# Patient Record
Sex: Female | Born: 1990 | Race: Asian | Hispanic: No | Smoking: Former smoker
Health system: Southern US, Community
[De-identification: ages and names within clinical notes are randomized; demographics above are authoritative.]

## PROBLEM LIST (undated history)

## (undated) ENCOUNTER — Inpatient Hospital Stay (HOSPITAL_COMMUNITY): Payer: Self-pay

## (undated) DIAGNOSIS — L309 Dermatitis, unspecified: Secondary | ICD-10-CM

## (undated) DIAGNOSIS — M549 Dorsalgia, unspecified: Secondary | ICD-10-CM

## (undated) DIAGNOSIS — N809 Endometriosis, unspecified: Secondary | ICD-10-CM

## (undated) DIAGNOSIS — F329 Major depressive disorder, single episode, unspecified: Secondary | ICD-10-CM

## (undated) DIAGNOSIS — D649 Anemia, unspecified: Secondary | ICD-10-CM

## (undated) DIAGNOSIS — X838XXA Intentional self-harm by other specified means, initial encounter: Secondary | ICD-10-CM

## (undated) DIAGNOSIS — L0291 Cutaneous abscess, unspecified: Secondary | ICD-10-CM

## (undated) DIAGNOSIS — F419 Anxiety disorder, unspecified: Secondary | ICD-10-CM

## (undated) DIAGNOSIS — F32A Depression, unspecified: Secondary | ICD-10-CM

## (undated) DIAGNOSIS — N83209 Unspecified ovarian cyst, unspecified side: Secondary | ICD-10-CM

## (undated) DIAGNOSIS — T7840XA Allergy, unspecified, initial encounter: Secondary | ICD-10-CM

## (undated) DIAGNOSIS — F4310 Post-traumatic stress disorder, unspecified: Secondary | ICD-10-CM

## (undated) DIAGNOSIS — Z8719 Personal history of other diseases of the digestive system: Secondary | ICD-10-CM

## (undated) HISTORY — DX: Unspecified ovarian cyst, unspecified side: N83.209

## (undated) HISTORY — DX: Endometriosis, unspecified: N80.9

## (undated) HISTORY — DX: Dermatitis, unspecified: L30.9

## (undated) HISTORY — DX: Major depressive disorder, single episode, unspecified: F32.9

## (undated) HISTORY — DX: Cutaneous abscess, unspecified: L02.91

## (undated) HISTORY — DX: Anemia, unspecified: D64.9

## (undated) HISTORY — PX: NO PAST SURGERIES: SHX2092

## (undated) HISTORY — DX: Anxiety disorder, unspecified: F41.9

## (undated) HISTORY — DX: Intentional self-harm by other specified means, initial encounter: X83.8XXA

## (undated) HISTORY — DX: Post-traumatic stress disorder, unspecified: F43.10

## (undated) HISTORY — DX: Depression, unspecified: F32.A

## (undated) HISTORY — DX: Dorsalgia, unspecified: M54.9

## (undated) HISTORY — DX: Allergy, unspecified, initial encounter: T78.40XA

---
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
## 2011-07-29 LAB — URINALYSIS, ROUTINE W REFLEX MICROSCOPIC
Bilirubin Urine: NEGATIVE
Hgb urine dipstick: NEGATIVE
Nitrite: NEGATIVE
Specific Gravity, Urine: 1.014
pH: 6.5

## 2011-07-29 LAB — URINE MICROSCOPIC-ADD ON

## 2013-11-12 ENCOUNTER — Ambulatory Visit (INDEPENDENT_AMBULATORY_CARE_PROVIDER_SITE_OTHER): Payer: BC Managed Care – PPO | Admitting: Emergency Medicine

## 2013-11-12 VITALS — BP 104/76 | HR 86 | Temp 99.4°F | Resp 16 | Ht 61.0 in | Wt 129.8 lb

## 2013-11-12 DIAGNOSIS — F411 Generalized anxiety disorder: Secondary | ICD-10-CM

## 2013-11-12 MED ORDER — CLONAZEPAM 0.5 MG PO TABS: 0.5000 mg | ORAL_TABLET | Freq: Every day | ORAL | Status: DC

## 2013-11-12 NOTE — Patient Instructions (Signed)

## 2013-11-12 NOTE — Progress Notes (Signed)
[facility] [address], [place] [address] [phone]  Date:  11/12/2013   Name:  Kristie ComberHae Y Thornton   DOB:  Mar 28, 1991   MRN:  [id number]  PCP:  No primary provider on file.    Chief Complaint: Medication Refill   History of Present Illness:  Kristie Thornton is a 23 y.o. very pleasant female patient who presents with the following:  Long term history of anxiety.  Multiple changes in her life, employment, loss of relationship, and moved back in with family.  Out of clonipin for past month. Some mild depression, eating and sleeping well.  No suicidal ideation or harm to others.  No improvement with over the counter medications or other home remedies. Denies other complaint or health concern today.   There are no active problems to display for this patient.   Past Medical History  Diagnosis Date  . Anxiety     History reviewed. No pertinent past surgical history.  History  Substance Use Topics  . Smoking status: Never Smoker   . Smokeless tobacco: Not on file  . Alcohol Use: No    No family history on file.  No Known Allergies  Medication list has been reviewed and updated.  No current outpatient prescriptions on file prior to visit.   No current facility-administered medications on file prior to visit.    Review of Systems:  As per HPI, otherwise negative.    Physical Examination: Filed Vitals:   11/12/13 1206  BP: 104/76  Pulse: 86  Temp: 99.4 F (37.4 C)  Resp: 16   Filed Vitals:   11/12/13 1206  Height: 5\' 1"  (1.549 m)  Weight: 129 lb 12.8 oz (58.877 kg)   Body mass index is 24.54 kg/(m^2). Ideal Body Weight: Weight in (lb) to have BMI = 25: 132  GEN: WDWN, NAD, Non-toxic, A & O x 3 HEENT: Atraumatic, Normocephalic. Neck supple. No masses, No LAD. Ears and Nose: No external deformity. CV: RRR, No M/G/R. No JVD. No thrill. No extra heart sounds. PULM: CTA B, no wheezes, crackles, rhonchi. No retractions. No resp. distress. No  accessory muscle use. ABD: S, NT, ND, +BS. No rebound. No HSM. EXTR: No c/c/e NEURO Normal gait.  PSYCH: Normally interactive. Conversant. Not depressed or anxious appearing.  Calm demeanor.    Assessment and Plan: Anxiety Refill meds  Signed,  Phillips OdorJeffery Kanyah Matsushima, MD

## 2014-01-14 ENCOUNTER — Ambulatory Visit (HOSPITAL_COMMUNITY)
Admission: RE | Admit: 2014-01-14 | Discharge: 2014-01-14 | Disposition: A | Discharge status: Home / Self Care | Payer: BC Managed Care – PPO | Source: Ambulatory Visit | Attending: Emergency Medicine | Admitting: Emergency Medicine

## 2014-01-14 ENCOUNTER — Ambulatory Visit (INDEPENDENT_AMBULATORY_CARE_PROVIDER_SITE_OTHER): Payer: BC Managed Care – PPO | Admitting: Emergency Medicine

## 2014-01-14 ENCOUNTER — Encounter (HOSPITAL_COMMUNITY): Payer: Self-pay

## 2014-01-14 VITALS — BP 108/62 | HR 100 | Temp 98.3°F | Resp 16 | Ht 61.0 in | Wt 132.0 lb

## 2014-01-14 DIAGNOSIS — D72829 Elevated white blood cell count, unspecified: Secondary | ICD-10-CM

## 2014-01-14 DIAGNOSIS — R1032 Left lower quadrant pain: Secondary | ICD-10-CM

## 2014-01-14 DIAGNOSIS — K589 Irritable bowel syndrome without diarrhea: Secondary | ICD-10-CM

## 2014-01-14 DIAGNOSIS — Z733 Stress, not elsewhere classified: Secondary | ICD-10-CM

## 2014-01-14 DIAGNOSIS — F439 Reaction to severe stress, unspecified: Secondary | ICD-10-CM

## 2014-01-14 LAB — POCT URINALYSIS DIPSTICK
BILIRUBIN UA: NEGATIVE
Glucose, UA: NEGATIVE
KETONES UA: NEGATIVE
Leukocytes, UA: NEGATIVE
Nitrite, UA: NEGATIVE
PH UA: 7
PROTEIN UA: NEGATIVE
RBC UA: NEGATIVE
Spec Grav, UA: 1.02
Urobilinogen, UA: 0.2

## 2014-01-14 LAB — COMPREHENSIVE METABOLIC PANEL
ALK PHOS: 51 U/L (ref 39–117)
ALT: 8 U/L (ref 0–35)
AST: 12 U/L (ref 0–37)
Albumin: 4.2 g/dL (ref 3.5–5.2)
BILIRUBIN TOTAL: 0.2 mg/dL (ref 0.2–1.2)
BUN: 10 mg/dL (ref 6–23)
CO2: 26 mEq/L (ref 19–32)
Calcium: 8.9 mg/dL (ref 8.4–10.5)
Chloride: 102 mEq/L (ref 96–112)
Creat: 0.47 mg/dL — ABNORMAL LOW (ref 0.50–1.10)
Glucose, Bld: 116 mg/dL — ABNORMAL HIGH (ref 70–99)
Potassium: 4.3 mEq/L (ref 3.5–5.3)
SODIUM: 137 meq/L (ref 135–145)
TOTAL PROTEIN: 6.4 g/dL (ref 6.0–8.3)

## 2014-01-14 LAB — POCT CBC
GRANULOCYTE PERCENT: 73.7 % (ref 37–80)
HCT, POC: 43 % (ref 37.7–47.9)
Hemoglobin: 13.8 g/dL (ref 12.2–16.2)
Lymph, poc: 2.6 (ref 0.6–3.4)
MCH, POC: 33 pg — AB (ref 27–31.2)
MCHC: 32.1 g/dL (ref 31.8–35.4)
MCV: 102.9 fL — AB (ref 80–97)
MID (CBC): 0.9 (ref 0–0.9)
MPV: 7.9 fL (ref 0–99.8)
PLATELET COUNT, POC: 340 10*3/uL (ref 142–424)
POC GRANULOCYTE: 9.7 — AB (ref 2–6.9)
POC LYMPH %: 19.7 % (ref 10–50)
POC MID %: 6.6 %M (ref 0–12)
RBC: 4.18 M/uL (ref 4.04–5.48)
RDW, POC: 13.2 %
WBC: 13.2 10*3/uL — AB (ref 4.6–10.2)

## 2014-01-14 LAB — POCT UA - MICROSCOPIC ONLY
Bacteria, U Microscopic: NEGATIVE
CRYSTALS, UR, HPF, POC: NEGATIVE
Casts, Ur, LPF, POC: NEGATIVE
MUCUS UA: NEGATIVE
RBC, urine, microscopic: NEGATIVE
WBC, UR, HPF, POC: NEGATIVE

## 2014-01-14 LAB — POCT URINE PREGNANCY: PREG TEST UR: NEGATIVE

## 2014-01-14 LAB — IFOBT (OCCULT BLOOD): IFOBT: NEGATIVE

## 2014-01-14 MED ORDER — IOHEXOL 300 MG/ML  SOLN
80.0000 mL | Freq: Once | INTRAMUSCULAR | Status: AC | PRN
  Administered 2014-01-14: 80 mL via INTRAVENOUS

## 2014-01-14 MED ORDER — CLONAZEPAM 1 MG PO TABS: ORAL_TABLET | ORAL | Status: DC

## 2014-01-14 MED ORDER — HYOSCYAMINE SULFATE ER 0.375 MG PO TB12: 0.3750 mg | ORAL_TABLET | Freq: Two times a day (BID) | ORAL | Status: DC

## 2014-01-14 NOTE — Progress Notes (Signed)
Subjective:    Patient ID: Kristie Thornton, female    DOB: 02-09-1991, 23 y.o.   MRN: [id number]  HPI  23 year old female presents with:  Lower left abdominal pain x 10 days, radiating to lower right side  Firm to touch, cramping, lays in fetal position for pain to subside.  Patient has a history of IBS, but episodes do not last this long [facility]  BM's irregular, sometimes spends hours in the bathroom, other times no BM's.  Diarrhea, soft, hard stools No urinary symptoms No fever, no vomiting LMP: yesterday only, one day period  Off BCP x 1 year; uses condoms Stressed, [place] moved to [place], leaving family. Moving for boyfriend. Living with his parents.  Requests refill of Klonopin   Review of Systems     Objective:   Physical Exam neck is supple chest is clear heart regular rate no murmur the abdomen is normal with normal bowel sounds liver and spleen are not enlarged there are no areas of tenderness no masses are felt Results for orders placed in visit on 01/14/14  POCT UA - MICROSCOPIC ONLY      Result Value Ref Range   WBC, Ur, HPF, POC neg     RBC, urine, microscopic neg     Bacteria, U Microscopic neg     Mucus, UA neg     Epithelial cells, urine per micros 4-6     Crystals, Ur, HPF, POC neg     Casts, Ur, LPF, POC neg     Yeast, UA      POCT URINALYSIS DIPSTICK      Result Value Ref Range   Color, UA yellow     Clarity, UA clear     Glucose, UA neg     Bilirubin, UA neg     Ketones, UA neg     Spec Grav, UA 1.020     Blood, UA neg     pH, UA 7.0     Protein, UA neg     Urobilinogen, UA 0.2     Nitrite, UA neg     Leukocytes, UA Negative    POCT CBC      Result Value Ref Range   WBC 13.2 (*) 4.6 - 10.2 K/uL   Lymph, poc 2.6  0.6 - 3.4   POC LYMPH PERCENT 19.7  10 - 50 %L   MID (cbc) 0.9  0 - 0.9   POC MID % 6.6  0 - 12 %M   POC Granulocyte 9.7 (*) 2 - 6.9   Granulocyte percent 73.7  37 - 80 %G   RBC 4.18   4.04 - 5.48 M/uL   Hemoglobin 13.8  12.2 - 16.2 g/dL   HCT, POC 78.243.0  95.637.7 - 47.9 %   MCV 102.9 (*) 80 - 97 fL   MCH, POC 33.0 (*) 27 - 31.2 pg   MCHC 32.1  31.8 - 35.4 g/dL   RDW, POC 21.313.2     Platelet Count, POC 340  142 - 424 K/uL   MPV 7.9  0 - 99.8 fL  POCT URINE PREGNANCY      Result Value Ref Range   Preg Test, Ur Negative     Hemosure neg      Assessment & Plan:  Case discussed with Dr. Elnoria HowardHung. We'll proceed with CT abdomen due to elevated white count and left lower quadrant abdominal pain rule out diverticulitis rule out  colitis.CT recent port showed bilateral punctate abnormal areas in both ovaries as well as a trace amount of fluid in the endometrial canal. There was no evidence of diverticulitis or colitis. There is a moderate stool burden. Patient advised to followup with GYN physician in [place]. She will go ahead and take the Levbid and treat her constipation. Patient is currently on her menstrual period which would explain the fluid in the endometrial canal.

## 2014-01-14 NOTE — Patient Instructions (Signed)
[facility] with Contrast. Go to radiology department to register. Drink first bottle of contrast at 10 and start the second bottle at 1045.

## 2014-02-10 ENCOUNTER — Other Ambulatory Visit: Payer: Self-pay | Admitting: Emergency Medicine

## 2014-02-11 ENCOUNTER — Telehealth: Payer: Self-pay | Admitting: Radiology

## 2014-02-11 NOTE — Telephone Encounter (Signed)
Called in.

## 2014-02-11 NOTE — Addendum Note (Signed)
Addended by: Sheppard PlumberBRIGGS, Kino Dunsworth A on: 02/11/2014 10:11 AM   Modules accepted: Orders, Medications

## 2014-02-11 NOTE — Telephone Encounter (Signed)
Pharm called and reported they just filled the Rx written 01/16/14 by Dr Cleta Albertsaub for the 1 mg tabs, 1/2 - 1 tab yesterday for pt. They did not fill the one called in today for the 0.5 mg since just filled the other. I advised we were just responding to their req for RF and she can delete that Rx called in today. Agreed.

## 2014-02-11 NOTE — Telephone Encounter (Signed)
Spoke to patient concerning medication refill. Dr Cleta Albertsaub has had me remind her not to get pregnant while taking this medication, she is aware, and states she will continue her BCP/ Amy  See previous note from [place] Rx not sent today, patient does have remaining refills on the Klonopin 1mg  tablet

## 2014-05-02 ENCOUNTER — Ambulatory Visit (INDEPENDENT_AMBULATORY_CARE_PROVIDER_SITE_OTHER): Payer: BC Managed Care – PPO | Admitting: Emergency Medicine

## 2014-05-02 VITALS — BP 112/74 | HR 81 | Temp 99.0°F | Resp 16 | Ht 61.5 in | Wt 121.3 lb

## 2014-05-02 DIAGNOSIS — Z733 Stress, not elsewhere classified: Secondary | ICD-10-CM

## 2014-05-02 DIAGNOSIS — Z3009 Encounter for other general counseling and advice on contraception: Secondary | ICD-10-CM

## 2014-05-02 DIAGNOSIS — J018 Other acute sinusitis: Secondary | ICD-10-CM

## 2014-05-02 DIAGNOSIS — Z202 Contact with and (suspected) exposure to infections with a predominantly sexual mode of transmission: Secondary | ICD-10-CM

## 2014-05-02 DIAGNOSIS — F411 Generalized anxiety disorder: Secondary | ICD-10-CM

## 2014-05-02 DIAGNOSIS — F439 Reaction to severe stress, unspecified: Secondary | ICD-10-CM

## 2014-05-02 LAB — POCT URINALYSIS DIPSTICK
Bilirubin, UA: NEGATIVE
Glucose, UA: NEGATIVE
Ketones, UA: NEGATIVE
Leukocytes, UA: NEGATIVE
Nitrite, UA: NEGATIVE
PROTEIN UA: NEGATIVE
SPEC GRAV UA: 1.015
UROBILINOGEN UA: 0.2
pH, UA: 6

## 2014-05-02 LAB — POCT UA - MICROSCOPIC ONLY
CASTS, UR, LPF, POC: NEGATIVE
CRYSTALS, UR, HPF, POC: NEGATIVE
Mucus, UA: NEGATIVE
WBC, Ur, HPF, POC: NEGATIVE
Yeast, UA: NEGATIVE

## 2014-05-02 LAB — POCT WET PREP WITH KOH
KOH Prep POC: NEGATIVE
RBC WET PREP PER HPF POC: NEGATIVE
Trichomonas, UA: NEGATIVE
WBC WET PREP PER HPF POC: NEGATIVE
YEAST WET PREP PER HPF POC: NEGATIVE

## 2014-05-02 LAB — POCT URINE PREGNANCY: PREG TEST UR: NEGATIVE

## 2014-05-02 MED ORDER — AMOXICILLIN-POT CLAVULANATE 875-125 MG PO TABS: 1.0000 | ORAL_TABLET | Freq: Two times a day (BID) | ORAL | Status: DC

## 2014-05-02 MED ORDER — PSEUDOEPHEDRINE-GUAIFENESIN ER 60-600 MG PO TB12: 1.0000 | ORAL_TABLET | Freq: Two times a day (BID) | ORAL | Status: DC

## 2014-05-02 MED ORDER — CLONAZEPAM 1 MG PO TABS: 1.0000 mg | ORAL_TABLET | Freq: Three times a day (TID) | ORAL | Status: DC | PRN

## 2014-05-02 MED ORDER — NORGESTREL-ETHINYL ESTRADIOL 0.3-30 MG-MCG PO TABS: 1.0000 | ORAL_TABLET | Freq: Every day | ORAL | Status: DC

## 2014-05-02 NOTE — Patient Instructions (Signed)
Sinusitis Sinusitis is redness, soreness, and swelling (inflammation) of the paranasal sinuses. Paranasal sinuses are air pockets within the bones of your face (beneath the eyes, the middle of the forehead, or above the eyes). In healthy paranasal sinuses, mucus is able to drain out, and air is able to circulate through them by way of your nose. However, when your paranasal sinuses are inflamed, mucus and air can become trapped. This can allow bacteria and other germs to grow and cause infection. Sinusitis can develop quickly and last only a short time (acute) or continue over a long period (chronic). Sinusitis that lasts for more than 12 weeks is considered chronic.  CAUSES  Causes of sinusitis include:  Allergies.  Structural abnormalities, such as displacement of the cartilage that separates your nostrils (deviated septum), which can decrease the air flow through your nose and sinuses and affect sinus drainage.  Functional abnormalities, such as when the small hairs (cilia) that line your sinuses and help remove mucus do not work properly or are not present. SYMPTOMS  Symptoms of acute and chronic sinusitis are the same. The primary symptoms are pain and pressure around the affected sinuses. Other symptoms include:  Upper toothache.  Earache.  Headache.  Bad breath.  Decreased sense of smell and taste.  A cough, which worsens when you are lying flat.  Fatigue.  Fever.  Thick drainage from your nose, which often is green and may contain pus (purulent).  Swelling and warmth over the affected sinuses. DIAGNOSIS  Your caregiver will perform a physical exam. During the exam, your caregiver may:  Look in your nose for signs of abnormal growths in your nostrils (nasal polyps).  Tap over the affected sinus to check for signs of infection.  View the inside of your sinuses (endoscopy) with a special imaging device with a light attached (endoscope), which is inserted into your  sinuses. If your caregiver suspects that you have chronic sinusitis, one or more of the following tests may be recommended:  Allergy tests.  Nasal culture--A sample of mucus is taken from your nose and sent to a lab and screened for bacteria.  Nasal cytology--A sample of mucus is taken from your nose and examined by your caregiver to determine if your sinusitis is related to an allergy. TREATMENT  Most cases of acute sinusitis are related to a viral infection and will resolve on their own within 10 days. Sometimes medicines are prescribed to help relieve symptoms (pain medicine, decongestants, nasal steroid sprays, or saline sprays).  However, for sinusitis related to a bacterial infection, your caregiver will prescribe antibiotic medicines. These are medicines that will help kill the bacteria causing the infection.  Rarely, sinusitis is caused by a fungal infection. In theses cases, your caregiver will prescribe antifungal medicine. For some cases of chronic sinusitis, surgery is needed. Generally, these are cases in which sinusitis recurs more than 3 times per year, despite other treatments. HOME CARE INSTRUCTIONS   Drink plenty of water. Water helps thin the mucus so your sinuses can drain more easily.  Use a humidifier.  Inhale steam 3 to 4 times a day (for example, sit in the bathroom with the shower running).  Apply a warm, moist washcloth to your face 3 to 4 times a day, or as directed by your caregiver.  Use saline nasal sprays to help moisten and clean your sinuses.  Take over-the-counter or prescription medicines for pain, discomfort, or fever only as directed by your caregiver. SEEK IMMEDIATE MEDICAL CARE IF:    You have increasing pain or severe headaches.  You have nausea, vomiting, or drowsiness.  You have swelling around your face.  You have vision problems.  You have a stiff neck.  You have difficulty breathing. MAKE SURE YOU:   Understand these  instructions.  Will watch your condition.  Will get help right away if you are not doing well or get worse. Document Released: 10/14/2005 Document Revised: 01/06/2012 Document Reviewed: 10/29/2011 ExitCare Patient Information 2015 [employer]. This information is not intended to replace advice given to you by your health care provider. Make sure you discuss any questions you have with your health care provider.  

## 2014-05-02 NOTE — Progress Notes (Signed)
[facility] [address], [place] [address] [phone]  Date:  05/02/2014   Name:  Kristie Thornton   DOB:  1991-03-24   MRN:  [id number]  PCP:  No PCP Per Patient    Chief Complaint: Medication Dose Change, Exposure to STD and Contraception   History of Present Illness:  Kristie Thornton is a 23 y.o. very pleasant female patient who presents with the following:  Requests OCP.  Had an unplanned sexual encounter over the weekend and requests an STD screening.  Is still having anxiety issues and is requesting an increased dose since moving home with parents.  One month duration nasal congestion, purulent drainage, cough and hoarseness.  No improvement with over the counter medications or other home remedies. Denies other complaint or health concern today.   There are no active problems to display for this patient.   Past Medical History  Diagnosis Date  . Anxiety     History reviewed. No pertinent past surgical history.  History  Substance Use Topics  . Smoking status: Never Smoker   . Smokeless tobacco: Not on file  . Alcohol Use: No    History reviewed. No pertinent family history.  No Known Allergies  Medication list has been reviewed and updated.  Current Outpatient Prescriptions on File Prior to Visit  Medication Sig Dispense Refill  . clonazePAM (KLONOPIN) 1 MG tablet Take one half to one tablet daily.  30 tablet  2   No current facility-administered medications on file prior to visit.    Review of Systems:  As per HPI, otherwise negative.    Physical Examination: Filed Vitals:   05/02/14 1135  BP: 112/74  Pulse: 81  Temp: 99 F (37.2 C)  Resp: 16   Filed Vitals:   05/02/14 1135  Height: 5' 1.5" (1.562 m)  Weight: 121 lb 4.8 oz (55.021 kg)   Body mass index is 22.55 kg/(m^2). Ideal Body Weight: Weight in (lb) to have BMI = 25: 134.2   GEN: WDWN, NAD, Non-toxic, Alert & Oriented x 3 HEENT: Atraumatic, Normocephalic.  Ears and  Nose: No external deformity. EXTR: No clubbing/cyanosis/edema NEURO: Normal gait.  PSYCH: Normally interactive. Conversant. Not depressed or anxious appearing.  Calm demeanor.    Assessment and Plan: STD exposure Anxiety OCP Lo ovral Labs Sinusitis augmentin mucinex d   Signed,  Phillips OdorJeffery Kaylean Tupou, MD   Results for orders placed in visit on 05/02/14  POCT URINE PREGNANCY      Result Value Ref Range   Preg Test, Ur Negative    POCT WET PREP WITH KOH      Result Value Ref Range   Trichomonas, UA Negative     Clue Cells Wet Prep HPF POC 0-1     Epithelial Wet Prep HPF POC 0-6     Yeast Wet Prep HPF POC neg     Bacteria Wet Prep HPF POC 1+     RBC Wet Prep HPF POC neg     WBC Wet Prep HPF POC neg     KOH Prep POC Negative    POCT UA - MICROSCOPIC ONLY      Result Value Ref Range   WBC, Ur, HPF, POC neg     RBC, urine, microscopic 0-4     Bacteria, U Microscopic 1+     Mucus, UA neg     Epithelial cells, urine per micros TNTC     Crystals, Ur, HPF, POC neg     Casts,  Ur, LPF, POC neg     Yeast, UA neg    POCT URINALYSIS DIPSTICK      Result Value Ref Range   Color, UA yellow     Clarity, UA clear     Glucose, UA neg     Bilirubin, UA neg     Ketones, UA neg     Spec Grav, UA 1.015     Blood, UA trace-intact     pH, UA 6.0     Protein, UA neg     Urobilinogen, UA 0.2     Nitrite, UA neg     Leukocytes, UA Negative

## 2014-05-03 LAB — GC/CHLAMYDIA PROBE AMP
CT PROBE, AMP APTIMA: NEGATIVE
GC Probe RNA: NEGATIVE

## 2014-05-03 LAB — HIV ANTIBODY (ROUTINE TESTING W REFLEX): HIV 1&2 Ab, 4th Generation: NONREACTIVE

## 2014-07-01 ENCOUNTER — Ambulatory Visit (INDEPENDENT_AMBULATORY_CARE_PROVIDER_SITE_OTHER): Payer: BC Managed Care – PPO | Admitting: Family Medicine

## 2014-07-01 VITALS — BP 110/62 | HR 95 | Temp 98.1°F | Resp 18 | Ht 62.0 in | Wt 119.0 lb

## 2014-07-01 DIAGNOSIS — F411 Generalized anxiety disorder: Secondary | ICD-10-CM

## 2014-07-01 DIAGNOSIS — N83201 Unspecified ovarian cyst, right side: Secondary | ICD-10-CM

## 2014-07-01 DIAGNOSIS — N83202 Unspecified ovarian cyst, left side: Principal | ICD-10-CM

## 2014-07-01 DIAGNOSIS — N83209 Unspecified ovarian cyst, unspecified side: Secondary | ICD-10-CM

## 2014-07-01 MED ORDER — NORGESTREL-ETHINYL ESTRADIOL 0.3-30 MG-MCG PO TABS: 1.0000 | ORAL_TABLET | Freq: Every day | ORAL | Status: DC

## 2014-07-01 NOTE — Patient Instructions (Signed)
Try to minimize the use of the clonazepam. You should still have some refills on that.  Continue taking your contraceptive pills for suppression of the ovaries. If the bleeding persists we will probably need a repeat gynecologic evaluation.  Recommend at some point you do see a counselor and/or psychiatrist for counseling and possible adjustment of medications  Get regular exercise. That is one of the best things to help unwind nerves.

## 2014-07-01 NOTE — Progress Notes (Signed)
Subjective: 23 year old [place] lady who has been on medications for her anxiety. She was last prescribed clonazepam 1 mg 3 times daily with 3 refills, which to my calculation should carry her until November. She also is on contraceptives for suppression of ovarian cyst. She says that she is gay and that they're not for birth control. She stopped taking them about a week ago and has persistent bleeding. She has no other major medical complaints. She tells about a couple of her friends who've died of drugs. She has stopped smoking since last time. She says she's going to move to [place] next week and find a Veterinary surgeon and stop taking anxiety medications. Has been on Zoloft before and did poorly on it  Objective: Physical exam not done. She talks very fast, almost pressured speech. Is fully oriented.  Assessment: Anxiety disorder, consider possibility of bipolar Birth-control pills for history of ovarian cysts  Plan:  Did not give her any more of the clonazepam since she should still have some. Recommend she see a psychiatrist or counselor. Continue taking her birth control pills.

## 2014-08-08 ENCOUNTER — Ambulatory Visit (INDEPENDENT_AMBULATORY_CARE_PROVIDER_SITE_OTHER): Payer: BC Managed Care – PPO | Admitting: Physician Assistant

## 2014-08-08 ENCOUNTER — Telehealth: Payer: Self-pay | Admitting: Physician Assistant

## 2014-08-08 VITALS — BP 114/68 | HR 94 | Temp 99.2°F | Resp 17 | Ht 62.0 in | Wt 119.0 lb

## 2014-08-08 DIAGNOSIS — Z0000 Encounter for general adult medical examination without abnormal findings: Secondary | ICD-10-CM

## 2014-08-08 DIAGNOSIS — N809 Endometriosis, unspecified: Secondary | ICD-10-CM | POA: Insufficient documentation

## 2014-08-08 DIAGNOSIS — F411 Generalized anxiety disorder: Secondary | ICD-10-CM

## 2014-08-08 DIAGNOSIS — Z3041 Encounter for surveillance of contraceptive pills: Secondary | ICD-10-CM

## 2014-08-08 DIAGNOSIS — N760 Acute vaginitis: Secondary | ICD-10-CM

## 2014-08-08 DIAGNOSIS — N83202 Unspecified ovarian cyst, left side: Secondary | ICD-10-CM

## 2014-08-08 DIAGNOSIS — A499 Bacterial infection, unspecified: Secondary | ICD-10-CM

## 2014-08-08 DIAGNOSIS — N83201 Unspecified ovarian cyst, right side: Secondary | ICD-10-CM | POA: Insufficient documentation

## 2014-08-08 DIAGNOSIS — N898 Other specified noninflammatory disorders of vagina: Secondary | ICD-10-CM

## 2014-08-08 DIAGNOSIS — B9689 Other specified bacterial agents as the cause of diseases classified elsewhere: Secondary | ICD-10-CM

## 2014-08-08 DIAGNOSIS — Z124 Encounter for screening for malignant neoplasm of cervix: Secondary | ICD-10-CM

## 2014-08-08 LAB — POCT URINALYSIS DIPSTICK
BILIRUBIN UA: NEGATIVE
Glucose, UA: NEGATIVE
KETONES UA: NEGATIVE
Leukocytes, UA: NEGATIVE
Nitrite, UA: NEGATIVE
PH UA: 6
Protein, UA: NEGATIVE
RBC UA: NEGATIVE
SPEC GRAV UA: 1.025
Urobilinogen, UA: 0.2

## 2014-08-08 LAB — POCT URINE PREGNANCY: Preg Test, Ur: NEGATIVE

## 2014-08-08 LAB — POCT WET PREP WITH KOH
KOH Prep POC: NEGATIVE
RBC Wet Prep HPF POC: NEGATIVE
Trichomonas, UA: NEGATIVE
YEAST WET PREP PER HPF POC: NEGATIVE

## 2014-08-08 LAB — POCT UA - MICROSCOPIC ONLY
Bacteria, U Microscopic: NEGATIVE
Casts, Ur, LPF, POC: NEGATIVE
Crystals, Ur, HPF, POC: NEGATIVE
EPITHELIAL CELLS, URINE PER MICROSCOPY: NEGATIVE
MUCUS UA: NEGATIVE
RBC, URINE, MICROSCOPIC: NEGATIVE
WBC, UR, HPF, POC: NEGATIVE
YEAST UA: NEGATIVE

## 2014-08-08 MED ORDER — NORGESTREL-ETHINYL ESTRADIOL 0.3-30 MG-MCG PO TABS: 1.0000 | ORAL_TABLET | Freq: Every day | ORAL | Status: DC

## 2014-08-08 MED ORDER — CLONAZEPAM 1 MG PO TABS: 1.0000 mg | ORAL_TABLET | Freq: Three times a day (TID) | ORAL | Status: DC | PRN

## 2014-08-08 MED ORDER — VENLAFAXINE HCL ER 37.5 MG PO CP24: ORAL_CAPSULE | ORAL | Status: DC

## 2014-08-08 MED ORDER — METRONIDAZOLE 500 MG PO TABS: 500.0000 mg | ORAL_TABLET | Freq: Two times a day (BID) | ORAL | Status: AC

## 2014-08-08 NOTE — Telephone Encounter (Signed)
Pt advised of results. 

## 2014-08-08 NOTE — Progress Notes (Signed)
Subjective:    Patient ID: Kristie Thornton, female    DOB: 1991/06/19, 23 y.o.   MRN: [id number]   PCP: No PCP Per Patient  Chief Complaint  Patient presents with  . Annual Exam    with PAP  . Sore Throat  . Vaginal Discharge    x1 week   . Medication Refill    Klonopin      Active Ambulatory Problems    Diagnosis Date Noted  . Generalized anxiety disorder 08/08/2014  . Endometriosis 08/08/2014  . Bilateral ovarian cysts 08/08/2014   Resolved Ambulatory Problems    Diagnosis Date Noted  . No Resolved Ambulatory Problems   Past Medical History  Diagnosis Date  . Anxiety     History reviewed. No pertinent past surgical history.  No Known Allergies  Prior to Admission medications   Medication Sig Start Date End Date Taking? Authorizing Provider  clonazePAM (KLONOPIN) 1 MG tablet Take 1 tablet (1 mg total) by mouth 3 (three) times daily as needed for anxiety. 05/02/14  Yes Carmelina DaneJeffery S Anderson, MD  norgestrel-ethinyl estradiol (LO/OVRAL) 0.3-30 MG-MCG tablet Take 1 tablet by mouth daily. 07/01/14  Yes Peyton Najjaravid H Hopper, MD    History   Social History  . Marital Status: Single    Spouse Name: n/a    Number of Children: 0  . Years of Education: 12th +   Occupational History  . sales     [employer]   Social History Main Topics  . Smoking status: Former Smoker    Quit date: 07/05/2014  . Smokeless tobacco: Former NeurosurgeonUser     Comment: previously vaped  . Alcohol Use: No  . Drug Use: No  . Sexual Activity: Yes    Partners: Male    Birth Control/ Protection: Pill   Other Topics Concern  . None   Social History Narrative   Parents are from [place]. Parents and her siblings were born in the [place]    family history includes Cancer in her mother. indicated that her mother is alive. She indicated that her father is alive. She indicated that both of her sisters are alive. She indicated that her brother is alive. She indicated that her maternal grandmother is deceased. She  indicated that her maternal grandfather is deceased. She indicated that her paternal grandmother is deceased. She indicated that her paternal grandfather is deceased.   HPI  Presents for annual physical exam, with several other issues to address.  1. She needs a refill of her contraceptive.  She takes it continuously, skipping the non-active pills, to treat underlying endometriosis and ovarian cysts.   2. 1 week of vaginal discharge.  No itching. Sexually active with 1 female partner. Had full STI screening about 2 months ago, including HSV IgG and reports all were negative. No dyspareunia.   3. Sore throat.  She has lots of drainage in the throat, and a little cough.  Subjective mild fever. No ear pain, headache, myalgias.   4. Requests a new prescription of the clonazepam to fill next month.  She's working on reducing use to 1/2 tablet twice daily after a friend told her clonazepam can be habit-forming. She repeatedly states that she doesn't like to take medication, and in her teens, used marijuana to self-treat her symptoms. Didn't tolerate medications previously tried due to adverse effects: sertraline (suicidality), and paroxetine (itch).  Also previously treated for ADD with Vyvanse, but after graduated from HS, didn't need it any more.  She repeatedly tells  me that she needs to leave to go to work, and as such declines flu and Tdap vaccines and blood work.  While she's somewhat concerned that she may be pregnant, she doesn't want to wait for the result of the test and states, "You can just call me."  Review of Systems As above, otherwise negative.    Objective:   Physical Exam  Vitals reviewed. Constitutional: She is oriented to person, place, and time. Vital signs are normal. She appears well-developed and well-nourished. She is active and cooperative. No distress.  HENT:  Head: Normocephalic and atraumatic.  Right Ear: Hearing, tympanic membrane, external ear and ear canal normal. No  foreign bodies.  Left Ear: Hearing, tympanic membrane, external ear and ear canal normal. No foreign bodies.  Nose: Nose normal.  Mouth/Throat: Uvula is midline, oropharynx is clear and moist and mucous membranes are normal. No oral lesions. Normal dentition. No dental abscesses or uvula swelling. No oropharyngeal exudate.  Eyes: Conjunctivae, EOM and lids are normal. Pupils are equal, round, and reactive to light. Right eye exhibits no discharge. Left eye exhibits no discharge. No scleral icterus.  Fundoscopic exam:      The right eye shows no arteriolar narrowing, no AV nicking, no exudate, no hemorrhage and no papilledema.       The left eye shows no arteriolar narrowing, no AV nicking, no exudate, no hemorrhage and no papilledema.  Neck: Trachea normal, normal range of motion and full passive range of motion without pain. Neck supple. No spinous process tenderness and no muscular tenderness present. No mass and no thyromegaly present.  Cardiovascular: Normal rate, regular rhythm, normal heart sounds, intact distal pulses and normal pulses.   Pulmonary/Chest: Effort normal and breath sounds normal. She exhibits no tenderness and no retraction. Right breast exhibits no inverted nipple, no mass, no nipple discharge, no skin change and no tenderness. Left breast exhibits no inverted nipple, no mass, no nipple discharge, no skin change and no tenderness. Breasts are symmetrical.  Abdominal: Soft. Normal appearance and bowel sounds are normal. She exhibits no distension and no mass. There is no hepatosplenomegaly. There is no tenderness. There is no rigidity, no rebound, no guarding, no CVA tenderness, no tenderness at McBurney's point and negative Murphy's sign. No hernia. Hernia confirmed negative in the right inguinal area and confirmed negative in the left inguinal area.  Genitourinary: Rectum normal, vagina normal and uterus normal. Rectal exam shows no external hemorrhoid and no fissure. No breast  swelling, tenderness, discharge or bleeding. Pelvic exam was performed with patient supine. No labial fusion. There is no rash, tenderness, lesion or injury on the right labia. There is no rash, tenderness, lesion or injury on the left labia. Cervix exhibits no motion tenderness, no discharge and no friability. Right adnexum displays no mass, no tenderness and no fullness. Left adnexum displays no mass, no tenderness and no fullness. No erythema, tenderness or bleeding around the vagina. No foreign body around the vagina. No signs of injury around the vagina. No vaginal discharge found.  Cervix appears multiparous, but she reports no history of pregnancy or treatment of the cervix.  Musculoskeletal: She exhibits no edema and no tenderness.       Cervical back: Normal.       Thoracic back: Normal.       Lumbar back: Normal.  Lymphadenopathy:       Head (right side): No tonsillar, no preauricular, no posterior auricular and no occipital adenopathy present.  Head (left side): No tonsillar, no preauricular, no posterior auricular and no occipital adenopathy present.    She has no cervical adenopathy.    She has no axillary adenopathy.       Right: No inguinal and no supraclavicular adenopathy present.       Left: No inguinal and no supraclavicular adenopathy present.  Neurological: She is alert and oriented to person, place, and time. She has normal strength and normal reflexes. No cranial nerve deficit. She exhibits normal muscle tone. Coordination and gait normal.  Skin: Skin is warm, dry and intact. No rash noted. She is not diaphoretic. No cyanosis or erythema. Nails show no clubbing.  Psychiatric: She has a normal mood and affect. Her speech is normal and behavior is normal. Judgment and thought content normal.   Results for orders placed in visit on 08/08/14  POCT URINE PREGNANCY      Result Value Ref Range   Preg Test, Ur Negative    POCT UA - MICROSCOPIC ONLY      Result Value Ref  Range   WBC, Ur, HPF, POC neg     RBC, urine, microscopic neg     Bacteria, U Microscopic neg     Mucus, UA neg     Epithelial cells, urine per micros neg     Crystals, Ur, HPF, POC neg     Casts, Ur, LPF, POC neg     Yeast, UA neg    POCT URINALYSIS DIPSTICK      Result Value Ref Range   Color, UA yellow     Clarity, UA clear     Glucose, UA neg     Bilirubin, UA neg     Ketones, UA neg     Spec Grav, UA 1.025     Blood, UA neg     pH, UA 6.0     Protein, UA neg     Urobilinogen, UA 0.2     Nitrite, UA neg     Leukocytes, UA Negative    POCT WET PREP WITH KOH      Result Value Ref Range   Trichomonas, UA Negative     Clue Cells Wet Prep HPF POC 3-10     Epithelial Wet Prep HPF POC 6-20     Yeast Wet Prep HPF POC neg     Bacteria Wet Prep HPF POC 2+     RBC Wet Prep HPF POC neg     WBC Wet Prep HPF POC 10-20     KOH Prep POC Negative            Assessment & Plan:  1. Annual physical exam Age appropriate anticipatory guidance provided.  2. Generalized anxiety disorder Planned TSH, but she left without lab draw. Counseled at length regarding chronic use of benzodiazepines. Reviewed mechanism of action of various families of drugs used to treat anxiety and depression. - POCT UA - Microscopic Only - POCT urinalysis dipstick - venlafaxine XR (EFFEXOR-XR) 37.5 MG 24 hr capsule; Take 1 tablet daily for 1 week, then increase to 2 tablets once daily  Dispense: 180 capsule; Refill: 3 - clonazePAM (KLONOPIN) 1 MG tablet; Take 1 tablet (1 mg total) by mouth 3 (three) times daily as needed for anxiety.  Dispense: 90 tablet; Refill: 0  3. Vaginal discharge, BV Wet Prep, not available at the time she left, reveals BV. If symptoms persist, will plan CT/GC testing again. - POCT Wet Prep with KOH -metronidazole 500 mg 1 PO BID  x 7 days, #14 NO RF  4. Encounter for surveillance of contraceptive pills Tolerating current contraception well. - POCT urine pregnancy -  norgestrel-ethinyl estradiol (LO/OVRAL) 0.3-30 MG-MCG tablet; Take 1 tablet by mouth daily.  Dispense: 3 Package; Refill: 3  5. Screening for cervical cancer Await pap results. - Pap IG w/ reflex to HPV when ASC-U   Fernande Brashelle S. Floreine Kingdon, PA-C Physician Assistant-Certified [facility]

## 2014-08-08 NOTE — Patient Instructions (Signed)
Get plenty of rest and drink at least 64 ounces of water daily.  I will contact you with your lab results as soon as they are available.   If you have not heard from me in 2 weeks, please contact me.  The fastest way to get your results is to register for My Chart (see the instructions on the last page of this printout).  Use OTC Mucinex DM to help thin the mucous draining in your throat and to reduce the cough.  Keeping You Healthy  Get These Tests 1. Blood Pressure- Have your blood pressure checked once a year by your health care provider.  Normal blood pressure is 120/80. 2. Weight- Have your body mass index (BMI) calculated to screen for obesity.  BMI is measure of body fat based on height and weight.  You can also calculate your own BMI at https://www.west-esparza.com/www.nhlbisupport.com/bmi/. 3. Cholesterol- Have your cholesterol checked every 5 years starting at age 23 then yearly starting at age 23. 4. Chlamydia, HIV, and other sexually transmitted diseases- Get screened every year until age 23, then within three months of each new sexual provider. 5. Pap Smear- Every 1-3 years; discuss with your health care provider. 6. Mammogram- Every year starting at age 23  Take these medicines  Calcium with Vitamin D-Your body needs 1200 mg of Calcium each day and [phone] IU of Vitamin D daily.  Your body can only absorb 500 mg of Calcium at a time so Calcium must be taken in 2 or 3 divided doses throughout the day.  Multivitamin with folic acid- Once daily if it is possible for you to become pregnant.  Get these Immunizations  Gardasil-Series of three doses; prevents HPV related illness such as genital warts and cervical cancer.  Menactra-Single dose; prevents meningitis.  Tetanus shot- Every 10 years.  Flu shot-Every year.  Take these steps 1. Do not smoke-Your healthcare provider can help you quit.  For tips on how to quit go to www.smokefree.gov or call 1-800 QUITNOW. 2. Be physically active- Exercise 5  days a week for at least 30 minutes.  If you are not already physically active, start slow and gradually work up to 30 minutes of moderate physical activity.  Examples of moderate activity include walking briskly, dancing, swimming, bicycling, etc. 3. Breast Cancer- A self breast exam every month is important for early detection of breast cancer.  For more information and instruction on self breast exams, ask your healthcare provider or [place].eswww.womenshealth.gov/faq/breast-self-exam.cfm. 4. Eat a healthy diet- Eat a variety of healthy foods such as fruits, vegetables, whole grains, low fat milk, low fat cheeses, yogurt, lean meats, poultry and fish, beans, nuts, tofu, etc.  For more information go to www. Thenutritionsource.org 5. Drink alcohol in moderation- Limit alcohol intake to one drink or less per day. Never drink and drive. 6. Depression- Your emotional health is as important as your physical health.  If you're feeling down or losing interest in things you normally enjoy please talk to your healthcare provider about being screened for depression. 7. Dental visit- Brush and floss your teeth twice daily; visit your dentist twice a year. 8. Eye doctor- Get an eye exam at least every 2 years. 9. Helmet use- Always wear a helmet when riding a bicycle, motorcycle, rollerblading or skateboarding. 10. Safe sex- If you may be exposed to sexually transmitted infections, use a condom. 11. Seat belts- Seat belts can save your live; always wear one. 12. Smoke/Carbon Monoxide detectors- These detectors need to be  installed on the appropriate level of your home. Replace batteries at least once a year. 13. Skin cancer- When out in the sun please cover up and use sunscreen 15 SPF or higher. 14. Violence- If anyone is threatening or hurting you, please tell your healthcare provider.

## 2014-08-08 NOTE — Telephone Encounter (Signed)
Please call this patient. Her labs reveal: 1. She is NOT pregnant.  2. She has a mild bacterial infection in the vagina, Bacterial Vaginosis (BV). I've sent a prescription for metronidazole to the pharmacy for her. If the vaginal discharge persists, she needs re-evaluation.

## 2014-08-08 NOTE — Addendum Note (Signed)
Addended by: Fernande BrasJEFFERY, Tameshia Bonneville S on: 08/08/2014 03:51 PM   Modules accepted: Level of Service

## 2014-08-10 LAB — PAP IG W/ RFLX HPV ASCU

## 2014-08-30 ENCOUNTER — Other Ambulatory Visit: Payer: Self-pay | Admitting: Emergency Medicine

## 2014-08-31 NOTE — Telephone Encounter (Signed)
She's a week early for this. Is she taking the Effexor? If she's still needing to take the clonazepam 3 times each day, we need to make an adjustment.

## 2014-09-02 NOTE — Telephone Encounter (Signed)
I had called pt on 08/31/14 and she had to get off the phone bc her boss was calling her. She told me she would call back, but I hadn't heard from her. I was able to reach pt again who reported that she had travelled to [place] and all of her medications, even BC was confiscated at the airport. She was difficult to understand on the phone - not a good connection but she was complaining about back pain and about her endometriosis. She said that was not happy with her BCPs anyway and she and her boyfriend had decided to use condoms. I advised her to come back into office be evaluated for back pain/endometriosis, discuss [facility] and get RFs on her medication. Pt stated that she is working 12 hr days but she will try to get here first thing in the morning to see Dr Clelia CroftShaw or Dareen PianoAnderson bc they are familiar w/her family.

## 2014-09-16 ENCOUNTER — Other Ambulatory Visit: Payer: Self-pay | Admitting: Internal Medicine

## 2014-09-16 NOTE — Telephone Encounter (Signed)
Forward to Rohm and HaasChelle Jeffrey--it's her patient

## 2014-09-17 ENCOUNTER — Other Ambulatory Visit: Payer: Self-pay | Admitting: Internal Medicine

## 2014-09-19 NOTE — Telephone Encounter (Signed)
Not my patient

## 2014-09-21 ENCOUNTER — Ambulatory Visit (INDEPENDENT_AMBULATORY_CARE_PROVIDER_SITE_OTHER): Payer: BC Managed Care – PPO | Admitting: Family Medicine

## 2014-09-21 ENCOUNTER — Ambulatory Visit (INDEPENDENT_AMBULATORY_CARE_PROVIDER_SITE_OTHER): Payer: BC Managed Care – PPO

## 2014-09-21 VITALS — BP 92/56 | HR 91 | Temp 98.4°F | Resp 16 | Ht 61.0 in | Wt 115.4 lb

## 2014-09-21 DIAGNOSIS — Z13220 Encounter for screening for lipoid disorders: Secondary | ICD-10-CM

## 2014-09-21 DIAGNOSIS — N912 Amenorrhea, unspecified: Secondary | ICD-10-CM

## 2014-09-21 DIAGNOSIS — F411 Generalized anxiety disorder: Secondary | ICD-10-CM

## 2014-09-21 DIAGNOSIS — R05 Cough: Secondary | ICD-10-CM

## 2014-09-21 DIAGNOSIS — R059 Cough, unspecified: Secondary | ICD-10-CM

## 2014-09-21 DIAGNOSIS — Z130 Encounter for screening for diseases of the blood and blood-forming organs and certain disorders involving the immune mechanism: Secondary | ICD-10-CM

## 2014-09-21 DIAGNOSIS — M545 Low back pain, unspecified: Secondary | ICD-10-CM

## 2014-09-21 DIAGNOSIS — Z1329 Encounter for screening for other suspected endocrine disorder: Secondary | ICD-10-CM

## 2014-09-21 DIAGNOSIS — J209 Acute bronchitis, unspecified: Secondary | ICD-10-CM

## 2014-09-21 DIAGNOSIS — Z3202 Encounter for pregnancy test, result negative: Secondary | ICD-10-CM

## 2014-09-21 LAB — POCT URINALYSIS DIPSTICK
Bilirubin, UA: NEGATIVE
Blood, UA: NEGATIVE
Glucose, UA: NEGATIVE
Ketones, UA: 15
Leukocytes, UA: NEGATIVE
Nitrite, UA: NEGATIVE
Protein, UA: NEGATIVE
Spec Grav, UA: 1.02
Urobilinogen, UA: 0.2
pH, UA: 7

## 2014-09-21 LAB — POCT UA - MICROSCOPIC ONLY
Bacteria, U Microscopic: NEGATIVE
Casts, Ur, LPF, POC: NEGATIVE
Crystals, Ur, HPF, POC: NEGATIVE
Mucus, UA: NEGATIVE
RBC, urine, microscopic: NEGATIVE
WBC, Ur, HPF, POC: NEGATIVE
Yeast, UA: NEGATIVE

## 2014-09-21 LAB — POCT URINE PREGNANCY: Preg Test, Ur: NEGATIVE

## 2014-09-21 LAB — POCT CBC
Granulocyte percent: 57.5 % (ref 37–80)
HCT, POC: 41.9 % (ref 37.7–47.9)
Hemoglobin: 13.6 g/dL (ref 12.2–16.2)
Lymph, poc: 2.9 (ref 0.6–3.4)
MCH, POC: 32.8 pg — AB (ref 27–31.2)
MCHC: 32.6 g/dL (ref 31.8–35.4)
MCV: 100.6 fL — AB (ref 80–97)
MID (cbc): 0.3 (ref 0–0.9)
MPV: 6.2 fL (ref 0–99.8)
POC Granulocyte: 4.4 (ref 2–6.9)
POC LYMPH PERCENT: 38.1 % (ref 10–50)
POC MID %: 4.4 % (ref 0–12)
Platelet Count, POC: 315 K/uL (ref 142–424)
RBC: 4.16 M/uL (ref 4.04–5.48)
RDW, POC: 13.2 %
WBC: 7.7 K/uL (ref 4.6–10.2)

## 2014-09-21 MED ORDER — BENZONATATE 100 MG PO CAPS: 200.0000 mg | ORAL_CAPSULE | Freq: Two times a day (BID) | ORAL | Status: DC | PRN

## 2014-09-21 MED ORDER — VENLAFAXINE HCL ER 37.5 MG PO CP24: ORAL_CAPSULE | ORAL | Status: DC

## 2014-09-21 MED ORDER — AZITHROMYCIN 250 MG PO TABS: ORAL_TABLET | ORAL | Status: DC

## 2014-09-21 MED ORDER — CLONAZEPAM 1 MG PO TABS: 1.0000 mg | ORAL_TABLET | Freq: Three times a day (TID) | ORAL | Status: DC | PRN

## 2014-09-21 MED ORDER — HYDROCODONE-HOMATROPINE 5-1.5 MG/5ML PO SYRP: 5.0000 mL | ORAL_SOLUTION | Freq: Every evening | ORAL | Status: DC | PRN

## 2014-09-21 NOTE — Progress Notes (Signed)
Chief Complaint:  Chief Complaint  Patient presents with  . Cough    x 2 months  . Medication Refill    lexapro; klonopin   . Back Pain    HPI: Kristie Thornton is a 23 y.o. female who is here for mutliple reasons  1. She would like her effexor and also klonopin refilled since  Left them in [place], Was in [place] taking care of mom who had colon surgery for colon cancer , left meds in [place] , was in [place] for 1 week  2. She has had cough since end of September, can;t seelp at night, not SOB, wheezing; she does smoke but occasionally.  She has not had fevers or chills, she denies and body aches or msk aches or rash or diarrhea /nausea/vomitingSxs all started prior to going to [place].  3. She also has had back pain, Has endometriosis and ovarian cyst need records from ob/gyn , last CT abd and pelvis below, she also has  scoliosis. As well.  Feel similar sxs  to cyst but  then also something extra. Has tried otc meds without relief.   IMPRESSION: 1. Suspected bilateral punctate adnexal cysts, right greater than left, with trace amount of presumably physiologic fluid within the endometrial canal and pelvic cul-de-sac. Otherwise, no explanation for patient's left lower quadrant abdominal pain. Further evaluation could be performed with pelvic ultrasound as clinically indicated. 2. Moderate colonic stool burden without evidence of obstruction. No definite evidence of diverticulitis or appendicitis.   Electronically Signed  By: Simonne Come M.D.  On: 01/14/2014 11:39    Past Medical History  Diagnosis Date  . Anxiety    No past surgical history on file. History   Social History  . Marital Status: Single    Spouse Name: n/a    Number of Children: 0  . Years of Education: 12th +   Occupational History  . sales     [employer]   Social History Main Topics  . Smoking status: Former Smoker    Quit date: 07/05/2014  . Smokeless tobacco: Former Neurosurgeon     Comment: previously  vaped  . Alcohol Use: No  . Drug Use: No  . Sexual Activity:    Partners: Male    Birth Control/ Protection: Pill   Other Topics Concern  . None   Social History Narrative   Parents are from [place]. Parents and her siblings were born in the [place].   Family History  Problem Relation Age of Onset  . Cancer Mother     colon cancer   Allergies  Allergen Reactions  . Sertraline Hcl Other (See Comments)    Suicidality   Prior to Admission medications   Medication Sig Start Date End Date Taking? Authorizing Provider  clonazePAM (KLONOPIN) 1 MG tablet Take 1 tablet (1 mg total) by mouth 3 (three) times daily as needed for anxiety. Patient not taking: Reported on 09/21/2014 08/08/14   Fernande Bras, PA-C  norgestrel-ethinyl estradiol (LO/OVRAL) 0.3-30 MG-MCG tablet Take 1 tablet by mouth daily. Patient not taking: Reported on 09/21/2014 08/08/14   Fernande Bras, PA-C  venlafaxine XR (EFFEXOR-XR) 37.5 MG 24 hr capsule Take 1 tablet daily for 1 week, then increase to 2 tablets once daily Patient not taking: Reported on 09/21/2014 08/08/14   Chelle S Jeffery, PA-C     ROS: The patient denies fevers, chills, night sweats, unintentional weight loss, chest pain, palpitations, wheezing, dyspnea on exertion, nausea, vomiting, abdominal pain,  dysuria, hematuria, melena, numbness, weakness, or tingling.   All other systems have been reviewed and were otherwise negative with the exception of those mentioned in the HPI and as above.    PHYSICAL EXAM: Filed Vitals:   09/21/14 1024  BP: 92/56  Pulse: 91  Temp: 98.4 F (36.9 C)  Resp: 16   Filed Vitals:   09/21/14 1024  Height: 5\' 1"  (1.549 m)  Weight: 115 lb 6.4 oz (52.345 kg)   Body mass index is 21.82 kg/(m^2).  General: Alert, no acute distress HEENT:  Normocephalic, atraumatic, oropharynx patent. EOMI, PERRLA TM nl, erythematous throat, No exudates. + boggy nares, + sinus tenderness. Cardiovascular:  Regular rate and rhythm,  no rubs murmurs or gallops.  No Carotid bruits, radial pulse intact. No pedal edema.  Respiratory: Clear to auscultation bilaterally.  No wheezes, rales, or rhonchi.  No cyanosis, no use of accessory musculature GI: No organomegaly, abdomen is soft and non-tender, positive bowel sounds.  No masses. Skin: No rashes. Neurologic: Facial musculature symmetric. Psychiatric: Patient is appropriate throughout our interaction. Lymphatic: No cervical lymphadenopathy Musculoskeletal: Gait intact. + paramsk tenderness bilateral back  Full ROM 5/5 strength, 2/2 DTRs No saddle anesthesia Straight leg negative Hip and knee exam--normal    LABS: Results for orders placed or performed in visit on 09/21/14  POCT CBC  Result Value Ref Range   WBC 7.7 4.6 - 10.2 K/uL   Lymph, poc 2.9 0.6 - 3.4   POC LYMPH PERCENT 38.1 10 - 50 %L   MID (cbc) 0.3 0 - 0.9   POC MID % 4.4 0 - 12 %M   POC Granulocyte 4.4 2 - 6.9   Granulocyte percent 57.5 37 - 80 %G   RBC 4.16 4.04 - 5.48 M/uL   Hemoglobin 13.6 12.2 - 16.2 g/dL   HCT, POC 16.141.9 09.637.7 - 47.9 %   MCV 100.6 (A) 80 - 97 fL   MCH, POC 32.8 (A) 27 - 31.2 pg   MCHC 32.6 31.8 - 35.4 g/dL   RDW, POC 04.513.2 %   Platelet Count, POC 315 142 - 424 K/uL   MPV 6.2 0 - 99.8 fL  POCT UA - Microscopic Only  Result Value Ref Range   WBC, Ur, HPF, POC Neg    RBC, urine, microscopic Neg    Bacteria, U Microscopic Neg    Mucus, UA Neg    Epithelial cells, urine per micros 0-1    Crystals, Ur, HPF, POC Neg    Casts, Ur, LPF, POC Neg    Yeast, UA Neg   POCT urinalysis dipstick  Result Value Ref Range   Color, UA Yellow    Clarity, UA Clear    Glucose, UA Neg    Bilirubin, UA Neg    Ketones, UA 15    Spec Grav, UA 1.020    Blood, UA Neg    pH, UA 7.0    Protein, UA Neg    Urobilinogen, UA 0.2    Nitrite, UA Neg    Leukocytes, UA Negative   POCT urine pregnancy  Result Value Ref Range   Preg Test, Ur Negative      EKG/XRAY:   Primary read [facility]  [facility]. Bronchitic changes vs less likely infiltrate Lumbar spine neg for fracture or dislocation   ASSESSMENT/PLAN: Encounter Diagnoses  Name Primary?  . Cough Yes  . Amenorrhea   . Screening for deficiency anemia   . Screening for hyperlipidemia   . Screening  for thyroid disorder   . Midline low back pain without sciatica   . Acute bronchitis, unspecified organism   . Generalized anxiety disorder    Rx z pack, tessalon perles, hycodan, albuterol prn Rx Konopin and also effexor She will establish care with a different PCP due to changes in insurance, will get records from ob.gyn Narcotic Prescription Profile pulled and there is no illegal activites Labs pending F/u in 6 months prn   Gross sideeffects, risk and benefits, and alternatives of medications d/w patient. Patient is aware that all medications have potential sideeffects and we are unable to predict every sideeffect or drug-drug interaction that may occur.  Jadore Mcguffin PHUONG, DO 09/21/2014 1:01 PM

## 2014-09-21 NOTE — Patient Instructions (Signed)
Acute Bronchitis °Bronchitis is inflammation of the airways that extend from the windpipe into the lungs (bronchi). The inflammation often causes mucus to develop. This leads to a cough, which is the most common symptom of bronchitis.  °In acute bronchitis, the condition usually develops suddenly and goes away over time, usually in a couple weeks. Smoking, allergies, and asthma can make bronchitis worse. Repeated episodes of bronchitis may cause further lung problems.  °CAUSES °Acute bronchitis is most often caused by the same virus that causes a cold. The virus can spread from person to person (contagious) through coughing, sneezing, and touching contaminated objects. °SIGNS AND SYMPTOMS  °· Cough.   °· Fever.   °· Coughing up mucus.   °· Body aches.   °· Chest congestion.   °· Chills.   °· Shortness of breath.   °· Sore throat.   °DIAGNOSIS  °Acute bronchitis is usually diagnosed through a physical exam. Your health care provider will also ask you questions about your medical history. Tests, such as chest X-rays, are sometimes done to rule out other conditions.  °TREATMENT  °Acute bronchitis usually goes away in a couple weeks. Oftentimes, no medical treatment is necessary. Medicines are sometimes given for relief of fever or cough. Antibiotic medicines are usually not needed but may be prescribed in certain situations. In some cases, an inhaler may be recommended to help reduce shortness of breath and control the cough. A cool mist vaporizer may also be used to help thin bronchial secretions and make it easier to clear the chest.  °HOME CARE INSTRUCTIONS °· Get plenty of rest.   °· Drink enough fluids to keep your urine clear or pale yellow (unless you have a medical condition that requires fluid restriction). Increasing fluids may help thin your respiratory secretions (sputum) and reduce chest congestion, and it will prevent dehydration.   °· Take medicines only as directed by your health care provider. °· If  you were prescribed an antibiotic medicine, finish it all even if you start to feel better. °· Avoid smoking and secondhand smoke. Exposure to cigarette smoke or irritating chemicals will make bronchitis worse. If you are a smoker, consider using nicotine gum or skin patches to help control withdrawal symptoms. Quitting smoking will help your lungs heal faster.   °· Reduce the chances of another bout of acute bronchitis by washing your hands frequently, avoiding people with cold symptoms, and trying not to touch your hands to your mouth, nose, or eyes.   °· Keep all follow-up visits as directed by your health care provider.   °SEEK MEDICAL CARE IF: °Your symptoms do not improve after 1 week of treatment.  °SEEK IMMEDIATE MEDICAL CARE IF: °· You develop an increased fever or chills.   °· You have chest pain.   °· You have severe shortness of breath. °· You have bloody sputum.   °· You develop dehydration. °· You faint or repeatedly feel like you are going to pass out. °· You develop repeated vomiting. °· You develop a severe headache. °MAKE SURE YOU:  °· Understand these instructions. °· Will watch your condition. °· Will get help right away if you are not doing well or get worse. °Document Released: 11/21/2004 Document Revised: 02/28/2014 Document Reviewed: 04/06/2013 °ExitCare® Patient Information ©2015 [employer]. This information is not intended to replace advice given to you by your health care provider. Make sure you discuss any questions you have with your health care provider. ° °

## 2014-09-22 LAB — LIPID PANEL
Cholesterol: 282 mg/dL — ABNORMAL HIGH (ref 0–200)
HDL: 65 mg/dL (ref >39)
LDL Cholesterol: 203 mg/dL — ABNORMAL HIGH (ref 0–99)
Total CHOL/HDL Ratio: 4.3 Ratio
Triglycerides: 68 mg/dL (ref <150)
VLDL: 14 mg/dL (ref 0–40)

## 2014-09-22 LAB — COMPLETE METABOLIC PANEL WITH GFR
ALT: 14 U/L (ref 0–35)
AST: 15 U/L (ref 0–37)
Alkaline Phosphatase: 40 U/L (ref 39–117)
CO2: 27 mEq/L (ref 19–32)
Creat: 0.58 mg/dL (ref 0.50–1.10)
Sodium: 139 mEq/L (ref 135–145)
Total Bilirubin: 0.7 mg/dL (ref 0.2–1.2)
Total Protein: 7.4 g/dL (ref 6.0–8.3)

## 2014-09-22 LAB — COMPLETE METABOLIC PANEL WITHOUT GFR
Albumin: 4.5 g/dL (ref 3.5–5.2)
BUN: 11 mg/dL (ref 6–23)
Calcium: 9.8 mg/dL (ref 8.4–10.5)
Chloride: 103 meq/L (ref 96–112)
GFR, Est African American: >89 mL/min
GFR, Est Non African American: >89 mL/min
Glucose, Bld: 83 mg/dL (ref 70–99)
Potassium: 4.3 meq/L (ref 3.5–5.3)

## 2014-09-22 LAB — TSH: TSH: 1.809 u[IU]/mL (ref 0.350–4.500)

## 2014-10-01 ENCOUNTER — Ambulatory Visit (INDEPENDENT_AMBULATORY_CARE_PROVIDER_SITE_OTHER): Payer: BC Managed Care – PPO | Admitting: Family Medicine

## 2014-10-01 VITALS — BP 100/60 | HR 112 | Temp 99.5°F | Resp 16 | Ht 61.5 in | Wt 113.0 lb

## 2014-10-01 DIAGNOSIS — N926 Irregular menstruation, unspecified: Secondary | ICD-10-CM

## 2014-10-01 DIAGNOSIS — Z3202 Encounter for pregnancy test, result negative: Secondary | ICD-10-CM

## 2014-10-01 DIAGNOSIS — J0100 Acute maxillary sinusitis, unspecified: Secondary | ICD-10-CM

## 2014-10-01 DIAGNOSIS — F411 Generalized anxiety disorder: Secondary | ICD-10-CM

## 2014-10-01 LAB — POCT URINE PREGNANCY: PREG TEST UR: NEGATIVE

## 2014-10-01 MED ORDER — IPRATROPIUM BROMIDE 0.03 % NA SOLN: 2.0000 | Freq: Two times a day (BID) | NASAL | Status: DC

## 2014-10-01 MED ORDER — HYDROCOD POLST-CHLORPHEN POLST 10-8 MG/5ML PO LQCR: 5.0000 mL | Freq: Two times a day (BID) | ORAL | Status: DC | PRN

## 2014-10-01 MED ORDER — CEFDINIR 300 MG PO CAPS: 600.0000 mg | ORAL_CAPSULE | Freq: Every day | ORAL | Status: DC

## 2014-10-01 NOTE — Progress Notes (Signed)
Subjective:    Patient ID: Kristie Thornton, female    DOB: 15-Apr-1991, 23 y.o.   MRN: [id number]  HPI Chief Complaint  Patient presents with  . Cough    pt states she was here recently and her sickness has not improved  . Insomnia   This chart was scribed for Nilda SimmerKristi Alix Lahmann, MD by Andrew Auaven Small, ED Scribe. This patient was seen in room 3 and the patient's care was started at 11:05 AM.  HPI Comments: Kristie Thornton is a 23 y.o. female who presents to the [facility] complaining of a cough. Pt was seen 1 week ago by Dr. Conley RollsLe for a cough. She states she has taken the Z pak and tessalon prescribed during last visit. Pt states she has not been sleeping well. She reports unchanged symptoms including congestion, rhinorrhea consisting of yellow green mucus, SOB, and hoarseness  She denies fever, chills, sweats, ear pain, and emesis.  Pt states she needs medical clearance to be on the [employer] show.   Pt reports mother is in her 3860's with hx of colon cancer. Pt reports her father is in 8460's and currently has testicular cancer. She reports sister fibromyalgia and a brother with ADHD.   Pt is currently single. She is not dating. She lives by herself. Pt states she lives by herself. Pt smokes marijuana occasionally, drinks alcohol 1-2 times a week. Pt denies hx past hospitalization and surgeries.    Pt reports emotionally she is doing okay. She reports she has been talking to a pastor for counseling. She denies self harm but has had thoughts in the past.  Has recently undergone a break up with a boyfriend.  Good social support.    Pt states her last menstrual cycle which started last week was heavier than normal with a black color. She reports the month before she missed her cycle. Pt is concerned she may have had a miscarriage.   Past Medical History  Diagnosis Date  . Anxiety    Allergies  Allergen Reactions  . Sertraline Hcl Other (See Comments)    Suicidality   Prior to  Admission medications   Medication Sig Start Date End Date Taking? Authorizing Provider  benzonatate (TESSALON) 100 MG capsule Take 2 capsules (200 mg total) by mouth 2 (two) times daily as needed. 09/21/14  Yes Thao P Le, DO  clonazePAM (KLONOPIN) 1 MG tablet Take 1 tablet (1 mg total) by mouth 3 (three) times daily as needed for anxiety. 08/08/14  Yes Chelle S Jeffery, PA-C  clonazePAM (KLONOPIN) 1 MG tablet Take 1 tablet (1 mg total) by mouth 3 (three) times daily as needed for anxiety. 09/21/14  Yes Thao P Le, DO  HYDROcodone-homatropine (HYCODAN) 5-1.5 MG/5ML syrup Take 5 mLs by mouth at bedtime as needed. 09/21/14  Yes Thao P Le, DO  norgestrel-ethinyl estradiol (LO/OVRAL) 0.3-30 MG-MCG tablet Take 1 tablet by mouth daily. 08/08/14  Yes Chelle S Jeffery, PA-C  venlafaxine XR (EFFEXOR-XR) 37.5 MG 24 hr capsule Take 2 tablets PO once daily 09/21/14  Yes Thao P Le, DO   Review of Systems  Constitutional: Negative for fever, chills and diaphoresis.  HENT: Positive for congestion, rhinorrhea, sore throat and voice change. Negative for ear discharge and ear pain.   Respiratory: Positive for cough and shortness of breath.   Gastrointestinal: Positive for diarrhea. Negative for nausea, vomiting and abdominal pain.  Genitourinary: Positive for menstrual problem. Negative for vaginal discharge and vaginal pain.  Neurological: Positive  for headaches.  Psychiatric/Behavioral: Positive for sleep disturbance.   Objective:   Physical Exam  Constitutional: She is oriented to person, place, and time. She appears well-developed and well-nourished. No distress.  HENT:  Head: Normocephalic and atraumatic.  Right Ear: External ear normal.  Left Ear: External ear normal.  Nose: Right sinus exhibits maxillary sinus tenderness. Right sinus exhibits no frontal sinus tenderness. Left sinus exhibits maxillary sinus tenderness. Left sinus exhibits no frontal sinus tenderness.  Mouth/Throat: Oropharynx is clear  and moist.  Eyes: Conjunctivae and EOM are normal. Pupils are equal, round, and reactive to light.  Neck: Normal range of motion. Neck supple. No thyromegaly present.  Cardiovascular: Normal rate, regular rhythm and normal heart sounds.  Exam reveals no gallop and no friction rub.   No murmur heard. Pulmonary/Chest: Effort normal and breath sounds normal. She has no wheezes. She has no rales.  Abdominal: Soft. Bowel sounds are normal. She exhibits no distension and no mass. There is no tenderness. There is no rebound and no guarding.  Musculoskeletal: Normal range of motion.  Lymphadenopathy:    She has cervical adenopathy.  Neurological: She is alert and oriented to person, place, and time.  Skin: Skin is warm and dry. No rash noted. She is not diaphoretic.  Psychiatric: She has a normal mood and affect. Her behavior is normal.  Nursing note and vitals reviewed.  Assessment & Plan:   1. Pregnancy test negative   2. Acute maxillary sinusitis, recurrence not specified   3. Irregular menses   4. Generalized anxiety disorder      1. Acute maxillary sinusitis: Persistent; s/p Zpack; rx for Omnicef, Atrovent nasal spray, Tussionex provided.  Clearance to participate in [employer] show. 2.  Irregular menses:  New.  Urine pregnancy negative; heavy dark menses last week that ended in past 48 hours; negative urine pregnancy today rules out miscarriage last week. 3.  Generalized anxiety disorder: stable on Effexor and Klonopin.  Considering psychotherapy and supportive of counseling.    Meds ordered this encounter  Medications  . cefdinir (OMNICEF) 300 MG capsule    Sig: Take 2 capsules (600 mg total) by mouth daily.    Dispense:  20 capsule    Refill:  0  . ipratropium (ATROVENT) 0.03 % nasal spray    Sig: Place 2 sprays into the nose 2 (two) times daily.    Dispense:  30 mL    Refill:  0  . chlorpheniramine-HYDROcodone (TUSSIONEX) 10-8 MG/5ML LQCR    Sig: Take 5 mLs by mouth  every 12 (twelve) hours as needed for cough.    Dispense:  120 mL    Refill:  0     I personally performed the services described in this documentation, which was scribed in my presence. The recorded information has been reviewed and considered.  Nilda SimmerKristi Kenniya Westrich, M.D.  [facility]  [place] [address] [place], [place]  [id number] [phone] phone [phone] fax

## 2014-10-01 NOTE — Patient Instructions (Signed)

## 2014-10-04 ENCOUNTER — Encounter: Payer: Self-pay | Admitting: Family Medicine

## 2015-06-15 ENCOUNTER — Ambulatory Visit (INDEPENDENT_AMBULATORY_CARE_PROVIDER_SITE_OTHER): Payer: PRIVATE HEALTH INSURANCE | Admitting: Family Medicine

## 2015-06-15 VITALS — BP 106/70 | HR 93 | Temp 98.7°F | Resp 16 | Ht 61.5 in | Wt 126.2 lb

## 2015-06-15 DIAGNOSIS — R41840 Attention and concentration deficit: Secondary | ICD-10-CM | POA: Diagnosis not present

## 2015-06-15 DIAGNOSIS — Z113 Encounter for screening for infections with a predominantly sexual mode of transmission: Secondary | ICD-10-CM

## 2015-06-15 DIAGNOSIS — R1032 Left lower quadrant pain: Secondary | ICD-10-CM | POA: Diagnosis not present

## 2015-06-15 DIAGNOSIS — N926 Irregular menstruation, unspecified: Secondary | ICD-10-CM

## 2015-06-15 DIAGNOSIS — R059 Cough, unspecified: Secondary | ICD-10-CM

## 2015-06-15 DIAGNOSIS — N921 Excessive and frequent menstruation with irregular cycle: Secondary | ICD-10-CM | POA: Diagnosis not present

## 2015-06-15 DIAGNOSIS — B379 Candidiasis, unspecified: Secondary | ICD-10-CM

## 2015-06-15 DIAGNOSIS — F411 Generalized anxiety disorder: Secondary | ICD-10-CM

## 2015-06-15 DIAGNOSIS — R05 Cough: Secondary | ICD-10-CM

## 2015-06-15 DIAGNOSIS — J209 Acute bronchitis, unspecified: Secondary | ICD-10-CM | POA: Diagnosis not present

## 2015-06-15 DIAGNOSIS — Z30013 Encounter for initial prescription of injectable contraceptive: Secondary | ICD-10-CM

## 2015-06-15 LAB — POCT CBC
Granulocyte percent: 66.3 %G (ref 37–80)
HCT, POC: 45 % (ref 37.7–47.9)
Hemoglobin: 14.4 g/dL (ref 12.2–16.2)
Lymph, poc: 2 (ref 0.6–3.4)
MCH, POC: 31.7 pg — AB (ref 27–31.2)
MCHC: 32.1 g/dL (ref 31.8–35.4)
MCV: 98.7 fL — AB (ref 80–97)
MID (cbc): 0.4 (ref 0–0.9)
MPV: 6.1 fL (ref 0–99.8)
POC Granulocyte: 4.8 (ref 2–6.9)
POC LYMPH PERCENT: 28 % (ref 10–50)
POC MID %: 5.7 %M (ref 0–12)
Platelet Count, POC: 326 10*3/uL (ref 142–424)
RBC: 4.56 M/uL (ref 4.04–5.48)
RDW, POC: 13.6 %
WBC: 7.3 10*3/uL (ref 4.6–10.2)

## 2015-06-15 LAB — POCT WET PREP WITH KOH
KOH Prep POC: POSITIVE
Trichomonas, UA: NEGATIVE
Yeast Wet Prep HPF POC: POSITIVE

## 2015-06-15 LAB — POCT UA - MICROSCOPIC ONLY
Casts, Ur, LPF, POC: NEGATIVE
Crystals, Ur, HPF, POC: NEGATIVE
Mucus, UA: NEGATIVE
Yeast, UA: NEGATIVE

## 2015-06-15 LAB — POCT URINE PREGNANCY: Preg Test, Ur: NEGATIVE

## 2015-06-15 LAB — POCT URINALYSIS DIPSTICK
Glucose, UA: NEGATIVE
Ketones, UA: 15
Leukocytes, UA: NEGATIVE
Nitrite, UA: NEGATIVE
Protein, UA: 30
Spec Grav, UA: 1.025
Urobilinogen, UA: 0.2
pH, UA: 6

## 2015-06-15 LAB — COMPLETE METABOLIC PANEL WITH GFR
ALT: 11 U/L (ref 6–29)
AST: 18 U/L (ref 10–30)
BUN: 8 mg/dL (ref 7–25)
CO2: 25 mmol/L (ref 20–31)
Calcium: 9.5 mg/dL (ref 8.6–10.2)
Chloride: 99 mmol/L (ref 98–110)
Creat: 0.72 mg/dL (ref 0.50–1.10)
GFR, Est Non African American: >89 mL/min (ref >=60)
Potassium: 4.6 mmol/L (ref 3.5–5.3)

## 2015-06-15 LAB — COMPLETE METABOLIC PANEL WITHOUT GFR
Albumin: 4.9 g/dL (ref 3.6–5.1)
Alkaline Phosphatase: 46 U/L (ref 33–115)
GFR, Est African American: >89 mL/min (ref >=60)
Glucose, Bld: 88 mg/dL (ref 65–99)
Sodium: 137 mmol/L (ref 135–146)
Total Bilirubin: 0.5 mg/dL (ref 0.2–1.2)
Total Protein: 7.8 g/dL (ref 6.1–8.1)

## 2015-06-15 MED ORDER — ESCITALOPRAM OXALATE 10 MG PO TABS: 10.0000 mg | ORAL_TABLET | Freq: Every day | ORAL | Status: DC

## 2015-06-15 MED ORDER — BENZONATATE 100 MG PO CAPS: 200.0000 mg | ORAL_CAPSULE | Freq: Two times a day (BID) | ORAL | Status: DC | PRN

## 2015-06-15 MED ORDER — ALPRAZOLAM 0.25 MG PO TABS: 0.2500 mg | ORAL_TABLET | Freq: Two times a day (BID) | ORAL | Status: DC | PRN

## 2015-06-15 MED ORDER — AZITHROMYCIN 250 MG PO TABS: ORAL_TABLET | ORAL | Status: DC

## 2015-06-15 MED ORDER — HYDROCOD POLST-CPM POLST ER 10-8 MG/5ML PO SUER: 5.0000 mL | Freq: Two times a day (BID) | ORAL | Status: DC | PRN

## 2015-06-15 MED ORDER — FLUCONAZOLE 150 MG PO TABS: 150.0000 mg | ORAL_TABLET | Freq: Every day | ORAL | Status: DC

## 2015-06-15 NOTE — Progress Notes (Addendum)
Chief Complaint:  Chief Complaint  Patient presents with  . Anorexia    Pt states she has had significant weight loss  . Cough    Productive, Yellow-Green, Interrupts Pt's Sleep  . Diarrhea  . Medication Refill    Adderall  . Stress  . STD Check  . Insomnia    Pt states she only gets 1-2 hours of sleep  . Menstrual Problem    Pt states Menstrual came on twice and has been very painful    HPI: Kristie Thornton is a 24 y.o. female who reports to [facility] today complaining of: 1. Would like STD screening, she has had new partner since she and her fiance broke up 9 months ago. 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  2. 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 She is coughing and has bornchitis like sxs for 4 days, she has been back for 4 days. She has not been sellepign well. Right now 24 hour sun, the darkness makes her stay awake. Working in [place] for 9 months, in tourism. She did not want to do another winter there She has not had any fevers but has had chills, she is a smoker 4. She was getting the Adderall for ADHD. Would like to get that again, I do not have any records of this.  5. Menstrual cycle is irregualr, has had metorrhagia. 2 cycles back to back, She has always been irregular. She was on Depot  But would like Nexplanon, does not want Depot or OCP at this time, just would like Nexplanon Last pap and STD testing in March 2016, pap was normal . LAst Pap for [place] was in 2015 and was normal   Wants to be on birth control where she doe snot forget to take daily or every 3 months, prefers nexplanon   Wt Readings from Last 3 Encounters:  06/15/15 126 lb 3.2 oz (57.244 kg)  10/01/14 113 lb (51.256 kg)    09/21/14 115 lb 6.4 oz (52.345 kg)   Past Medical History  Diagnosis Date  . Anxiety    History reviewed. No pertinent past surgical history. Social History   Social History  . Marital Status: Single    Spouse Name: n/a  . Number of Children: 0  . Years of Education: 12th +   Occupational History  . sales     [employer]   Social History Main Topics  . Smoking status: Current Some Day Smoker    Last Attempt to Quit: 07/05/2014  . Smokeless tobacco: Former Neurosurgeon     Comment: previously vaped  . Alcohol Use: No  . Drug Use: No  . Sexual Activity:    Partners: Male    Birth Control/ Protection: Pill   Other Topics Concern  . None   Social History Narrative   Parents are from [place]. Parents and her siblings were born in the [place].      Marital status: single; not dating      Children:  None     Lives: alone      Employment: unemployed      Tobacco:  None      Alcohol: weekends; no DWIs      Drugs: marijuana socially         Family History  Problem Relation Age of Onset  . Cancer Mother     colon cancer  . Cancer Father 11    testicular cancer  . Fibromyalgia Sister   . ADD / ADHD Brother    Allergies  Allergen Reactions  . Sertraline Hcl Other (See Comments)    Suicidality   Prior to Admission medications   Medication Sig Start Date End Date Taking? Authorizing Provider  clonazePAM (KLONOPIN) 1 MG tablet Take 1 tablet (1 mg total) by mouth 3 (three) times daily as needed for anxiety. 09/21/14  Yes Garlen Reinig P Shoshana Johal, DO  benzonatate (TESSALON) 100 MG capsule Take 2 capsules (200 mg total) by mouth 2 (two) times daily as needed. Patient not taking: Reported on 06/15/2015 09/21/14   Kazimir Hartnett P Keawe Marcello, DO  cefdinir (OMNICEF) 300 MG capsule Take 2 capsules (600 mg total) by mouth daily. Patient not taking: Reported on 06/15/2015 10/01/14   Ethelda Chick, MD  chlorpheniramine-HYDROcodone (TUSSIONEX) 10-8 MG/5ML [facility] Take 5 mLs by mouth every 12 (twelve) hours as needed for  cough. Patient not taking: Reported on 06/15/2015 10/01/14   Ethelda Chick, MD  clonazePAM (KLONOPIN) 1 MG tablet Take 1 tablet (1 mg total) by mouth 3 (three) times daily as needed for anxiety. Patient not taking: Reported on 06/15/2015 08/08/14   Porfirio Oar, PA-C  HYDROcodone-homatropine (HYCODAN) 5-1.5 MG/5ML syrup Take 5 mLs by mouth at bedtime as needed. Patient not taking: Reported on 06/15/2015 09/21/14   Wolf Boulay P Horice Carrero, DO  ipratropium (ATROVENT) 0.03 % nasal spray Place 2 sprays into the nose 2 (two) times daily. Patient not taking: Reported on 06/15/2015 10/01/14   Ethelda Chick, MD  norgestrel-ethinyl estradiol (LO/OVRAL) 0.3-30 MG-MCG tablet Take 1 tablet by mouth daily. Patient not taking: Reported on 10/01/2014 08/08/14   Porfirio Oar, PA-C  venlafaxine XR (EFFEXOR-XR) 37.5 MG 24 hr capsule Take 2 tablets PO once daily Patient not taking: Reported on 06/15/2015 09/21/14   Ammy Lienhard P Kayla Weekes, DO     ROS: The patient denies fevers, chills, night sweats, unintentional weight loss, chest pain, palpitations, wheezing, dyspnea on exertion, nausea, vomiting, abdominal pain, dysuria, hematuria, melena, numbness, weakness, or tingling.   All other systems have been reviewed and were otherwise negative with the exception of those mentioned in the HPI and as above.    PHYSICAL EXAM: Filed Vitals:   06/15/15 1013  BP: 106/70  Pulse: 93  Temp: 98.7 F (37.1 C)  Resp: 16   Body mass index is 23.46 kg/(m^2).   General: Alert, no acute distress HEENT:  Normocephalic, atraumatic, oropharynx patent. EOMI, PERRLA Cardiovascular:  Regular rate and rhythm, no rubs murmurs or gallops.  No Carotid bruits, radial pulse intact. No pedal edema.  Respiratory: Clear to auscultation bilaterally.  No wheezes, rales, or rhonchi.  No cyanosis, no use of accessory musculature Abdominal: No organomegaly, abdomen is soft and non-tender, positive bowel sounds. No masses. Skin: No rashes. Neurologic: Facial  musculature symmetric. Psychiatric: Patient acts appropriately throughout our interaction. Lymphatic: No cervical or submandibular lymphadenopathy Musculoskeletal: Gait intact. No edema, tenderness, no calf swelling Gu-+ vaginal dc. No rashes or masses or lesions  LABS: Results for orders placed or performed in visit on 06/15/15  GC/Chlamydia Probe Amp  Result Value Ref Range   CT Probe RNA NEGATIVE    GC Probe RNA  NEGATIVE   COMPLETE METABOLIC PANEL WITH GFR  Result Value Ref Range   Sodium 137 135 - 146 mmol/L   Potassium 4.6 3.5 - 5.3 mmol/L   Chloride 99 98 - 110 mmol/L   CO2 25 20 - 31 mmol/L   Glucose, Bld 88 65 - 99 mg/dL   BUN 8 7 - 25 mg/dL   Creat 1.61 0.96 - 0.45 mg/dL   Total Bilirubin 0.5 0.2 - 1.2 mg/dL   Alkaline Phosphatase 46 33 - 115 U/L   AST 18 10 - 30 U/L   ALT 11 6 - 29 U/L   Total Protein 7.8 6.1 - 8.1 g/dL   Albumin 4.9 3.6 - 5.1 g/dL   Calcium 9.5 8.6 - 40.9 mg/dL   GFR, Est African American >89 >=60 mL/min   GFR, Est Non African American >89 >=60 mL/min  HIV antibody  Result Value Ref Range   HIV 1&2 Ab, 4th Generation NONREACTIVE NONREACTIVE  RPR  Result Value Ref Range   RPR Ser Ql NON REAC NON REAC  HSV(herpes simplex vrs) 1+2 ab-IgG  Result Value Ref Range   HSV 1 Glycoprotein G Ab, IgG 7.37 (H) IV   HSV 2 Glycoprotein G Ab, IgG <0.10 IV  POCT CBC  Result Value Ref Range   WBC 7.3 4.6 - 10.2 K/uL   Lymph, poc 2.0 0.6 - 3.4   POC LYMPH PERCENT 28.0 10 - 50 %L   MID (cbc) 0.4 0 - 0.9   POC MID % 5.7 0 - 12 %M   POC Granulocyte 4.8 2 - 6.9   Granulocyte percent 66.3 37 - 80 %G   RBC 4.56 4.04 - 5.48 M/uL   Hemoglobin 14.4 12.2 - 16.2 g/dL   HCT, POC 81.1 91.4 - 47.9 %   MCV 98.7 (A) 80 - 97 fL   MCH, POC 31.7 (A) 27 - 31.2 pg   MCHC 32.1 31.8 - 35.4 g/dL   RDW, POC 78.2 %   Platelet Count, POC 326.0 142 - 424 K/uL   MPV 6.1 0 - 99.8 fL  POCT urine pregnancy  Result Value Ref Range   Preg Test, Ur Negative Negative  POCT UA -  Microscopic Only  Result Value Ref Range   WBC, Ur, HPF, POC 0-1    RBC, urine, microscopic 0-3    Bacteria, U Microscopic Trace    Mucus, UA Negative    Epithelial cells, urine per micros 1-3    Crystals, Ur, HPF, POC Negative    Casts, Ur, LPF, POC Negative    Yeast, UA Negative   POCT urinalysis dipstick  Result Value Ref Range   Color, UA Amber    Clarity, UA Turbid    Glucose, UA Negative    Bilirubin, UA Small    Ketones, UA 15    Spec Grav, UA 1.025    Blood, UA Trace-Intact    pH, UA 6.0    Protein, UA 30    Urobilinogen, UA 0.2    Nitrite, UA Negative    Leukocytes, UA Negative Negative  POCT Wet Prep with KOH  Result Value Ref Range   Trichomonas, UA Negative    Clue Cells Wet Prep HPF POC Few    Epithelial Wet Prep HPF POC Moderate Few, Moderate, Many   Yeast Wet Prep HPF POC Positive    Bacteria Wet Prep HPF POC Moderate (A) None, Few   RBC Wet Prep HPF POC 3-7    WBC Wet Prep HPF  POC 0-2    KOH Prep POC Positive      EKG/XRAY:   Primary read interpreted by Dr. Conley Rolls at [facility].   ASSESSMENT/PLAN: Encounter Diagnoses  Name Primary?  . Acute bronchitis, unspecified organism Yes  . Screening for STD (sexually transmitted disease)   . Abdominal pain, left lower quadrant   . Metrorrhagia   . Irregular menses   . GAD (generalized anxiety disorder)   . Concentration deficit   . Yeast infection   . Cough    24 year old [place] female who is here for the following: 1. Acute bronchitis sxs-recently returned from [place], she is also a smoker, will rx z pack and tessallon perles, tussionex. Advise to quit smokinh 2. She was given several names of places to get ADHD/ADD testing 3. She will be referred to our clinic for Nexplanon if illegible 4. STD testing pending 5. Rx diflucan 6. Rx Lexapro , xanax  Prn use, she is ok with this plance has tried zoloft before and made her suicidal but had been on lexapro but did not give it a good try before she stopped. Will  help with anxiety/stress/insomnia. Denies SI/HI/halluciantions [place] controlled substance DB pulled, no illegal acitvities.  Fu in 2 months   Gross sideeffects, risk and benefits, and alternatives of medications d/w patient. Patient is aware that all medications have potential sideeffects and we are unable to predict every sideeffect or drug-drug interaction that may occur.  Hamilton Capri DO  06/19/2015 6:19 PM  Spoke to patient about lab results

## 2015-06-16 LAB — RPR

## 2015-06-16 LAB — GC/CHLAMYDIA PROBE AMP
CT Probe RNA: NEGATIVE
GC Probe RNA: NEGATIVE

## 2015-06-16 LAB — HIV ANTIBODY (ROUTINE TESTING W REFLEX): HIV 1&2 Ab, 4th Generation: NONREACTIVE

## 2015-06-19 LAB — HSV(HERPES SIMPLEX VRS) I + II AB-IGG
HSV 1 Glycoprotein G Ab, IgG: 7.37 IV — ABNORMAL HIGH
HSV 2 Glycoprotein G Ab, IgG: <0.1 IV

## 2015-06-20 ENCOUNTER — Telehealth: Payer: Self-pay

## 2015-06-20 NOTE — Telephone Encounter (Signed)
-----   Message from Lenell Antu, DO sent at 06/19/2015  6:39 PM EDT ----- Regarding: Nexplanon She would like nexplanon, can you get prior authorization or send this to the person in charge of this process so she can get it at our clinic at [address] by Dr Copland  Thanks Dr Conley Rolls

## 2015-06-20 NOTE — Telephone Encounter (Signed)
I called ins # and was advised that nexplanon does require a PA and "specialty" review. They could not give me any info as to what requirements would be for approval, but are faxing a form to complete.

## 2015-06-22 NOTE — Telephone Encounter (Signed)
Completed form and faxed in w/confirmation along with OV notes.

## 2015-06-30 ENCOUNTER — Encounter: Payer: Self-pay | Admitting: Family Medicine

## 2015-07-04 NOTE — Telephone Encounter (Signed)
After several calls/discussions with ins ([employer]). If pt has moved to [place], she needs to get a new [place] policy because we will not be in network on [employer], in which case cost to pt would be 50% after $5000 deductible is met. Called pt to advise, and she stated that she has met her deductible and can not get a new [place] policy until the first of the yr. Pt will check with her ins and make sure her deductible is met and then I advised her and ask for "Billing" to check cost of Nexplanon vs Depo shot. Dr Marin Comment, Juluis Rainier

## 2015-07-31 ENCOUNTER — Telehealth: Payer: Self-pay

## 2015-07-31 NOTE — Telephone Encounter (Signed)
Pt is needing to talk with dr Conley Rolls about a medication that was originally denied by insurance and she received a letter from insurance co that it was approved

## 2015-08-02 MED ORDER — CLONAZEPAM 1 MG PO TABS: ORAL_TABLET | ORAL | Status: DC

## 2015-08-02 NOTE — Telephone Encounter (Signed)
Kristie Thornton, I believe the pt is talking about nexplanon implant. Can you please call her back so that you can set up appt if needed. We should check pt's letter to verify coverage.
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
## 2015-08-04 ENCOUNTER — Ambulatory Visit (INDEPENDENT_AMBULATORY_CARE_PROVIDER_SITE_OTHER): Payer: PRIVATE HEALTH INSURANCE | Admitting: Physician Assistant

## 2015-08-04 VITALS — BP 124/78 | HR 110 | Temp 99.0°F | Resp 16 | Ht 61.0 in | Wt 121.0 lb

## 2015-08-04 DIAGNOSIS — M62830 Muscle spasm of back: Secondary | ICD-10-CM

## 2015-08-04 DIAGNOSIS — R05 Cough: Secondary | ICD-10-CM

## 2015-08-04 DIAGNOSIS — Z309 Encounter for contraceptive management, unspecified: Secondary | ICD-10-CM

## 2015-08-04 DIAGNOSIS — R053 Chronic cough: Secondary | ICD-10-CM

## 2015-08-04 DIAGNOSIS — Z3009 Encounter for other general counseling and advice on contraception: Secondary | ICD-10-CM

## 2015-08-04 LAB — POCT URINE PREGNANCY: Preg Test, Ur: NEGATIVE

## 2015-08-04 MED ORDER — MEDROXYPROGESTERONE ACETATE 150 MG/ML IM SUSP
150.0000 mg | Freq: Once | INTRAMUSCULAR | Status: AC
  Administered 2015-08-04: 150 mg via INTRAMUSCULAR

## 2015-08-04 MED ORDER — CYCLOBENZAPRINE HCL 5 MG PO TABS: 5.0000 mg | ORAL_TABLET | Freq: Three times a day (TID) | ORAL | Status: DC | PRN

## 2015-08-04 MED ORDER — ONDANSETRON 4 MG PO TBDP: 4.0000 mg | ORAL_TABLET | Freq: Three times a day (TID) | ORAL | Status: DC | PRN

## 2015-08-04 MED ORDER — PREDNISONE 20 MG PO TABS
20.0000 mg | ORAL_TABLET | Freq: Every day | ORAL | Status: DC
Start: 2015-08-04 — End: 2015-09-05

## 2015-08-04 MED ORDER — CLONAZEPAM 1 MG PO TABS: ORAL_TABLET | ORAL | Status: DC

## 2015-08-04 MED ORDER — HYDROCODONE-HOMATROPINE 5-1.5 MG/5ML PO SYRP: 5.0000 mL | ORAL_SOLUTION | Freq: Three times a day (TID) | ORAL | Status: DC | PRN

## 2015-08-04 NOTE — Progress Notes (Signed)
Subjective:    Patient ID: Kristie Thornton, female    DOB: Mar 01, 1991, 24 y.o.   MRN: [id number]  Chief Complaint  Patient presents with  . std screen  . Spasms    middle back and neck/ x 2 days  . Cough    x 2 months   Medications, allergies, past medical history, surgical history, family history, social history and problem list reviewed and updated.  HPI  5 yof presents with above complaints.   Interested in std screen. Tested 6 weeks ago and was negative for gc/ct, hiv, rpr, trich. Today states she has had a new partner for less than a week and thought she should get checked while here. Denies rash, lesions, discharge, dysuria. Denies likewise with her new partner. Denies fevers, chills.   Would like depo provera shot today. Has been off birth control for one year so needs urine preg first.   Also mentions persistent cough. Ongoing 2 months. Was seen and treated 6 weeks ago with azithro and tessalon. No relief. Cough occurs most days of the week, sometimes prod with white/clear sputum. Feels warm at home but not fevers. Denies chills. Does have allergies this time of year, feels slightly congested. Does not take anything. Denies asthma. Denies hx gerd. Sometimes burning after meals relieved with tums. Denies sick contacts. No whooping component to cough.   Also mentions mid upper back pain past 4 days. Denies trauma. Worse with turning head.   Review of Systems See HPI     Objective:   Physical Exam  Constitutional: She appears well-developed and well-nourished.  Non-toxic appearance. She does not have a sickly appearance. She does not appear ill. No distress.  BP 124/78 mmHg  Pulse 110  Temp(Src) 99 F (37.2 C) (Oral)  Resp 16  Ht  (1.549 m)  Wt 121 lb (54.885 kg)  BMI 22.87 kg/m2  SpO2 94%  LMP 07/27/2015   HENT:  Nose: No mucosal edema or rhinorrhea. Right sinus exhibits no maxillary sinus tenderness and no frontal sinus tenderness. Left sinus exhibits no maxillary  sinus tenderness and no frontal sinus tenderness.  Mouth/Throat: Uvula is midline, oropharynx is clear and moist and mucous membranes are normal.  Pulmonary/Chest: Effort normal and breath sounds normal. No tachypnea.  Musculoskeletal:       Thoracic back: She exhibits tenderness and spasm.       Back:  Spasm palpable over circled area.   Lymphadenopathy:       Head (right side): No submental, no submandibular and no tonsillar adenopathy present.       Head (left side): No submental, no submandibular and no tonsillar adenopathy present.    She has no cervical adenopathy.   Results for orders placed or performed in visit on 08/04/15  POCT urine pregnancy  Result Value Ref Range   Preg Test, Ur Negative Negative      Assessment & Plan:   Persistent cough for 3 weeks or longer - Plan: predniSONE (DELTASONE) 20 MG tablet, HYDROcodone-homatropine (HYCODAN) 5-1.5 MG/5ML syrup --unsure of etiology at this time --cough ongoing 2 months --> pt declines cxr at this time due to poor insurance coverage --5 day burst oral steroid as could be post viral cough from recent bronchitis --if no relief with this, could be post nasal drip component or possible lpr component as she does have occasional gerd --> discussed appropriate otc treatments for these issues --hycodan --#20 zofran as pt at times has some nausea/feels like vomiting during coughing  fits --pt was treated with azithromycin 2 weeks into the cough so doubt pertussis as etiology, normal lung exam today --rtc if no relief with these measures for chest xray, pt agreeable  Muscle spasm of back - Plan: cyclobenzaprine (FLEXERIL) 5 MG tablet --flexeril, heat, massage, light range of motion  Birth control counseling - Plan: POCT urine pregnancy, medroxyPROGESTERone (DEPO-PROVERA) injection 150 mg --urine preg negative --depo provera given --after discussion pt declined std tests at this time as she denies sx, just had negative screen 6 weeks  ago, and has only been with new partner less than one week --to rtc in future if she wishes std screen at that time  Donnajean Lopes, PA-C Physician Assistant-Certified [facility]  08/04/2015 3:02 PM

## 2015-08-04 NOTE — Patient Instructions (Addendum)
Your chronic cough could be due to ongoing irritation from the bronchitis. Please take the prednisone 60 mg per day for 5 days.  If the cough persists despite this it may be due to reflux. It may be a good idea to start a daily reflux medication like over the counter omeprazole (prilosec) to see if this helps with the cough.  The cough could also be from allergies and congestion causing post nasal drip. Taking a daily antihistamine like zyrtec and a daily nasal spray like flonase can help with this.  Please come back to see [place] in a few weeks if the cough persists despite these measures.  I still recommend a chest xray since the cough has persisted for so long so if you change your mind on this please come back to see [place].  I've sent in 20 zofran tabs for you if you continue to feel nauseated with the coughing.  I've sent in hycodan cough medicine for the cough. You can take this 2-3 times per day for the cough, keep in mind it can make you sleepy.  Your pregnancy test was negative today and you received your depo shot today. I cancelled Dr Irwin Brakeman original script for the klonopin since we could not find it and ordered another one. If you need refills please continue to get those through Dr Conley Rolls.  For the back spasms, please take the flexeril every 8 hours as needed. Keep in mind this can make you sleepy. Do not take the hycodan at the same time as the flexeril.  Applying heat to the area along with light massage should help the spasm to resolve as well.

## 2015-08-15 ENCOUNTER — Ambulatory Visit (INDEPENDENT_AMBULATORY_CARE_PROVIDER_SITE_OTHER): Payer: PRIVATE HEALTH INSURANCE | Admitting: Emergency Medicine

## 2015-08-15 VITALS — BP 118/68 | HR 119 | Temp 99.4°F | Resp 18 | Ht 61.25 in | Wt 125.8 lb

## 2015-08-15 DIAGNOSIS — M62830 Muscle spasm of back: Secondary | ICD-10-CM | POA: Diagnosis not present

## 2015-08-15 DIAGNOSIS — R05 Cough: Secondary | ICD-10-CM

## 2015-08-15 DIAGNOSIS — R053 Chronic cough: Secondary | ICD-10-CM

## 2015-08-15 DIAGNOSIS — J302 Other seasonal allergic rhinitis: Secondary | ICD-10-CM | POA: Diagnosis not present

## 2015-08-15 DIAGNOSIS — J209 Acute bronchitis, unspecified: Secondary | ICD-10-CM | POA: Diagnosis not present

## 2015-08-15 MED ORDER — TRIAMCINOLONE ACETONIDE 55 MCG/ACT NA AERO: 2.0000 | INHALATION_SPRAY | Freq: Every day | NASAL | Status: DC

## 2015-08-15 MED ORDER — CYCLOBENZAPRINE HCL 5 MG PO TABS: 5.0000 mg | ORAL_TABLET | Freq: Three times a day (TID) | ORAL | Status: DC | PRN

## 2015-08-15 MED ORDER — HYDROCOD POLST-CPM POLST ER 10-8 MG/5ML PO SUER: 5.0000 mL | Freq: Two times a day (BID) | ORAL | Status: DC

## 2015-08-15 MED ORDER — HYDROCODONE-HOMATROPINE 5-1.5 MG/5ML PO SYRP: 5.0000 mL | ORAL_SOLUTION | Freq: Three times a day (TID) | ORAL | Status: DC | PRN

## 2015-08-15 NOTE — Progress Notes (Signed)
Subjective:  Patient ID: Kristie Thornton, female    DOB: May 04, 1991  Age: 24 y.o. MRN: [id number]  CC: Cough; Hoarse; and Chills   HPI Marquis Dory PeruChung presents  with continued back spasm. She also has a persistent cough she apparently spilled her cough syrup at home she denies any fever chills or redness. Production. Overall she feels "better" has no sputum production no nasal drainage but does have some nasal congestion. She has no sore throat or fever chills she has no postnasal drainage  History Bradleigh has a past medical history of Anxiety.   She has no past surgical history on file.   Her  family history includes ADD / ADHD in her brother; Cancer in her mother; Cancer (age of onset: 8975) in her father; Fibromyalgia in her sister.  She   reports that she has been smoking.  She has quit using smokeless tobacco. She reports that she does not drink alcohol or use illicit drugs.  Outpatient Prescriptions Prior to Visit  Medication Sig Dispense Refill  . clonazePAM (KLONOPIN) 1 MG tablet Take 1/2-1 tab po TID prn 60 tablet 0  . fluconazole (DIFLUCAN) 150 MG tablet Take 1 tablet (150 mg total) by mouth daily. May repeat in 3 days 2 tablet 0  . HYDROcodone-homatropine (HYCODAN) 5-1.5 MG/5ML syrup Take 5 mLs by mouth every 8 (eight) hours as needed for cough. 120 mL 0  . ondansetron (ZOFRAN ODT) 4 MG disintegrating tablet Take 1 tablet (4 mg total) by mouth every 8 (eight) hours as needed for nausea or vomiting. 20 tablet 0  . predniSONE (DELTASONE) 20 MG tablet Take 1 tablet (20 mg total) by mouth daily with breakfast. 15 tablet 0  . cyclobenzaprine (FLEXERIL) 5 MG tablet Take 1 tablet (5 mg total) by mouth 3 (three) times daily as needed for muscle spasms. 30 tablet 0   No facility-administered medications prior to visit.    Social History   Social History  . Marital Status: Single    Spouse Name: n/a  . Number of Children: 0  . Years of Education: 12th +   Occupational History  . sales     [employer]   Social History Main Topics  . Smoking status: Current Some Day Smoker    Last Attempt to Quit: 07/05/2014  . Smokeless tobacco: Former NeurosurgeonUser     Comment: previously vaped  . Alcohol Use: No  . Drug Use: No  . Sexual Activity:    Partners: Male    Birth Control/ Protection: Pill   Other Topics Concern  . None   Social History Narrative   Parents are from [place]. Parents and her siblings were born in the [place]      Marital status: single; not dating      Children:  None     Lives: alone      Employment: unemployed      Tobacco:  None      Alcohol: weekends; no DWIs      Drugs: marijuana socially           Review of Systems  Constitutional: Negative for fever, chills and appetite change.  HENT: Negative for congestion, ear pain, postnasal drip, sinus pressure and sore throat.   Eyes: Negative for pain and redness.  Respiratory: Negative for cough, shortness of breath and wheezing.   Cardiovascular: Negative for leg swelling.  Gastrointestinal: Negative for nausea, vomiting, abdominal pain, diarrhea, constipation and blood in stool.  Endocrine: Negative for polyuria.  Genitourinary: Negative  for dysuria, urgency, frequency and flank pain.  Musculoskeletal: Negative for gait problem.  Skin: Negative for rash.  Neurological: Negative for weakness and headaches.  Psychiatric/Behavioral: Negative for confusion and decreased concentration. The patient is not nervous/anxious.     Objective:  BP 118/68 mmHg  Pulse 119  Temp(Src) 99.4 F (37.4 C) (Oral)  Resp 18  Ht 5' 1.25" (1.556 m)  Wt 125 lb 12.8 oz (57.063 kg)  BMI 23.57 kg/m2  SpO2 98%  LMP 07/27/2015  Physical Exam  Constitutional: She is oriented to person, place, and time. She appears well-developed and well-nourished. No distress.  HENT:  Head: Normocephalic and atraumatic.  Right Ear: External ear normal.  Left Ear: External ear normal.  Nose: Nose normal.  Eyes: Conjunctivae and EOM are  normal. Pupils are equal, round, and reactive to light. No scleral icterus.  Neck: Normal range of motion. Neck supple. No tracheal deviation present.  Cardiovascular: Normal rate, regular rhythm and normal heart sounds.   Pulmonary/Chest: Effort normal. No respiratory distress. She has no wheezes. She has no rales.  Abdominal: She exhibits no mass. There is no tenderness. There is no rebound and no guarding.  Musculoskeletal: She exhibits no edema.  Lymphadenopathy:    She has no cervical adenopathy.  Neurological: She is alert and oriented to person, place, and time. Coordination normal.  Skin: Skin is warm and dry. No rash noted.  Psychiatric: She has a normal mood and affect. Her behavior is normal.      Assessment & Plan:   Allyne was seen today for cough, hoarse and chills.  Diagnoses and all orders for this visit:  Acute bronchitis, unspecified organism  Seasonal allergic rhinitis  Muscle spasm of back -     cyclobenzaprine (FLEXERIL) 5 MG tablet; Take 1 tablet (5 mg total) by mouth 3 (three) times daily as needed for muscle spasms.  Other orders -     triamcinolone (NASACORT AQ) 55 MCG/ACT AERO nasal inhaler; Place 2 sprays into the nose daily. -     chlorpheniramine-HYDROcodone (TUSSIONEX PENNKINETIC ER) 10-8 MG/5ML SUER; Take 5 mLs by mouth 2 (two) times daily.   I am having Ms. Thornton start on triamcinolone and chlorpheniramine-HYDROcodone. I am also having her maintain her fluconazole, clonazePAM, predniSONE, ondansetron, HYDROcodone-homatropine, and cyclobenzaprine.  Meds ordered this encounter  Medications  . triamcinolone (NASACORT AQ) 55 MCG/ACT AERO nasal inhaler    Sig: Place 2 sprays into the nose daily.    Dispense:  1 Inhaler    Refill:  12  . cyclobenzaprine (FLEXERIL) 5 MG tablet    Sig: Take 1 tablet (5 mg total) by mouth 3 (three) times daily as needed for muscle spasms.    Dispense:  30 tablet    Refill:  0  . chlorpheniramine-HYDROcodone (TUSSIONEX  PENNKINETIC ER) 10-8 MG/5ML SUER    Sig: Take 5 mLs by mouth 2 (two) times daily.    Dispense:  60 mL    Refill:  0    Appropriate red flag conditions were discussed with the patient as well as actions that should be taken.  Patient expressed his understanding.  Follow-up: Return if symptoms worsen or fail to improve.  Carmelina Dane, MD

## 2015-08-15 NOTE — Patient Instructions (Signed)
Allergic Rhinitis Allergic rhinitis is when the mucous membranes in the nose respond to allergens. Allergens are particles in the air that cause your body to have an allergic reaction. This causes you to release allergic antibodies. Through a chain of events, these eventually cause you to release histamine into the blood stream. Although meant to protect the body, it is this release of histamine that causes your discomfort, such as frequent sneezing, congestion, and an itchy, runny nose.  CAUSES Seasonal allergic rhinitis (hay fever) is caused by pollen allergens that may come from grasses, trees, and weeds. Year-round allergic rhinitis (perennial allergic rhinitis) is caused by allergens such as house dust mites, pet dander, and mold spores. SYMPTOMS  Nasal stuffiness (congestion).  Itchy, runny nose with sneezing and tearing of the eyes. DIAGNOSIS Your health care provider can help you determine the allergen or allergens that trigger your symptoms. If you and your health care provider are unable to determine the allergen, skin or blood testing may be used. Your health care provider will diagnose your condition after taking your health history and performing a physical exam. Your health care provider may assess you for other related conditions, such as asthma, pink eye, or an ear infection. TREATMENT Allergic rhinitis does not have a cure, but it can be controlled by:  Medicines that block allergy symptoms. These may include allergy shots, nasal sprays, and oral antihistamines.  Avoiding the allergen. Hay fever may often be treated with antihistamines in pill or nasal spray forms. Antihistamines block the effects of histamine. There are over-the-counter medicines that may help with nasal congestion and swelling around the eyes. Check with your health care provider before taking or giving this medicine. If avoiding the allergen or the medicine prescribed do not work, there are many new medicines  your health care provider can prescribe. Stronger medicine may be used if initial measures are ineffective. Desensitizing injections can be used if medicine and avoidance does not work. Desensitization is when a patient is given ongoing shots until the body becomes less sensitive to the allergen. Make sure you follow up with your health care provider if problems continue. HOME CARE INSTRUCTIONS It is not possible to completely avoid allergens, but you can reduce your symptoms by taking steps to limit your exposure to them. It helps to know exactly what you are allergic to so that you can avoid your specific triggers. SEEK MEDICAL CARE IF:  You have a fever.  You develop a cough that does not stop easily (persistent).  You have shortness of breath.  You start wheezing.  Symptoms interfere with normal daily activities.   This information is not intended to replace advice given to you by your health care provider. Make sure you discuss any questions you have with your health care provider.   Document Released: 07/09/2001 Document Revised: 11/04/2014 Document Reviewed: 06/21/2013 Elsevier Interactive Patient Education 2016 Elsevier Inc.  

## 2015-09-04 ENCOUNTER — Telehealth: Payer: Self-pay
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
## 2015-09-05 ENCOUNTER — Ambulatory Visit (INDEPENDENT_AMBULATORY_CARE_PROVIDER_SITE_OTHER): Payer: PRIVATE HEALTH INSURANCE | Admitting: Family Medicine

## 2015-09-05 VITALS — BP 106/64 | HR 110 | Temp 99.2°F | Resp 16 | Ht 61.25 in | Wt 124.8 lb

## 2015-09-05 DIAGNOSIS — R05 Cough: Secondary | ICD-10-CM | POA: Diagnosis not present

## 2015-09-05 DIAGNOSIS — F411 Generalized anxiety disorder: Secondary | ICD-10-CM

## 2015-09-05 DIAGNOSIS — R059 Cough, unspecified: Secondary | ICD-10-CM

## 2015-09-05 MED ORDER — CLONAZEPAM 1 MG PO TABS: ORAL_TABLET | ORAL | Status: DC

## 2015-09-05 MED ORDER — HYDROCOD POLST-CPM POLST ER 10-8 MG/5ML PO SUER: 5.0000 mL | Freq: Two times a day (BID) | ORAL | Status: DC | PRN
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
## 2015-10-05 ENCOUNTER — Ambulatory Visit (INDEPENDENT_AMBULATORY_CARE_PROVIDER_SITE_OTHER): Payer: PRIVATE HEALTH INSURANCE | Admitting: Urgent Care

## 2015-10-05 VITALS — BP 108/60 | HR 64 | Temp 98.4°F | Resp 16 | Ht 63.75 in | Wt 126.8 lb

## 2015-10-05 DIAGNOSIS — J069 Acute upper respiratory infection, unspecified: Secondary | ICD-10-CM

## 2015-10-05 DIAGNOSIS — R05 Cough: Secondary | ICD-10-CM

## 2015-10-05 DIAGNOSIS — F419 Anxiety disorder, unspecified: Secondary | ICD-10-CM | POA: Diagnosis not present

## 2015-10-05 DIAGNOSIS — R059 Cough, unspecified: Secondary | ICD-10-CM

## 2015-10-05 MED ORDER — CLONAZEPAM 1 MG PO TABS: ORAL_TABLET | ORAL | Status: DC

## 2015-10-05 MED ORDER — AZITHROMYCIN 250 MG PO TABS: ORAL_TABLET | ORAL | Status: DC

## 2015-10-05 MED ORDER — HYDROCODONE-HOMATROPINE 5-1.5 MG/5ML PO SYRP: 5.0000 mL | ORAL_SOLUTION | Freq: Every evening | ORAL | Status: DC | PRN
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
## 2015-10-05 NOTE — Patient Instructions (Signed)
Clonazepam tablets What is this medicine? CLONAZEPAM (kloe NA ze pam) is a benzodiazepine. It is used to treat certain types of seizures. It is also used to treat panic disorder. This medicine may be used for other purposes; ask your health care provider or pharmacist if you have questions. What should I tell my health care provider before I take this medicine? They need to know if you have any of these conditions: -an alcohol or drug abuse problem -bipolar disorder, depression, psychosis or other mental health condition -glaucoma -kidney or liver disease -lung or breathing disease -myasthenia gravis -Parkinson's disease -porphyria -seizures or a history of seizures -suicidal thoughts -an unusual or allergic reaction to clonazepam, other benzodiazepines, foods, dyes, or preservatives -pregnant or trying to get pregnant -breast-feeding How should I use this medicine? Take this medicine by mouth with a glass of water. Follow the directions on the prescription label. If it upsets your stomach, take it with food or milk. Take your medicine at regular intervals. Do not take it more often than directed. Do not stop taking or change the dose except on the advice of your doctor or health care professional. A special MedGuide will be given to you by the pharmacist with each prescription and refill. Be sure to read this information carefully each time. Talk to your pediatrician regarding the use of this medicine in children. Special care may be needed. Overdosage: If you think you have taken too much of this medicine contact a poison control center or emergency room at once. NOTE: This medicine is only for you. Do not share this medicine with others. What if I miss a dose? If you miss a dose, take it as soon as you can. If it is almost time for your next dose, take only that dose. Do not take double or extra doses. What may interact with this medicine? -herbal or dietary supplements -medicines for  depression, anxiety, or psychotic disturbances -medicines for fungal infections like fluconazole, itraconazole, ketoconazole, voriconazole -medicines for HIV infection or AIDS -medicines for sleep -prescription pain medicines -propantheline -rifampin -sevelamer -some medicines for seizures like carbamazepine, phenobarbital, phenytoin, primidone This list may not describe all possible interactions. Give your health care provider a list of all the medicines, herbs, non-prescription drugs, or dietary supplements you use. Also tell them if you smoke, drink alcohol, or use illegal drugs. Some items may interact with your medicine. What should I watch for while using this medicine? Visit your doctor or health care professional for regular checks on your progress. Your body may become dependent on this medicine. If you have been taking this medicine regularly for some time, do not suddenly stop taking it. You must gradually reduce the dose or you may get severe side effects. Ask your doctor or health care professional for advice before increasing or decreasing the dose. Even after you stop taking this medicine it can still affect your body for several days. If you suffer from several types of seizures, this medicine may increase the chance of grand mal seizures (epilepsy). Let your doctor or health care professional know, he or she may want to prescribe an additional medicine. You may get drowsy or dizzy. Do not drive, use machinery, or do anything that needs mental alertness until you know how this medicine affects you. To reduce the risk of dizzy and fainting spells, do not stand or sit up quickly, especially if you are an older patient. Alcohol may increase dizziness and drowsiness. Avoid alcoholic drinks. Do not treat   yourself for coughs, colds or allergies without asking your doctor or health care professional for advice. Some ingredients can increase possible side effects. The use of this medicine may  increase the chance of suicidal thoughts or actions. Pay special attention to how you are responding while on this medicine. Any worsening of mood, or thoughts of suicide or dying should be reported to your health care professional right away. Women who become pregnant while using this medicine may enroll in the [place] Antiepileptic Drug Pregnancy Registry by calling [phone]. This registry collects information about the safety of antiepileptic drug use during pregnancy. What side effects may I notice from receiving this medicine? Side effects that you should report to your doctor or health care professional as soon as possible: -allergic reactions like skin rash, itching or hives, swelling of the face, lips, or tongue -changes in vision -confusion -depression -hallucinations -mood changes, excitability or aggressive behavior -movement difficulty, staggering or jerky movements -muscle cramps, weakness -tremors -unusual eye movements Side effects that usually do not require medical attention (report to your doctor or health care professional if they continue or are bothersome): -constipation or diarrhea -difficulty sleeping, nightmares -dizziness, drowsiness -headache -increased saliva from your mouth -nausea, vomiting This list may not describe all possible side effects. Call your doctor for medical advice about side effects. You may report side effects to FDA at 1-800-FDA-1088. Where should I keep my medicine? Keep out of the reach of children. This medicine can be abused. Keep your medicine in a safe place to protect it from theft. Do not share this medicine with anyone. Selling or giving away this medicine is dangerous and against the law. This medicine may cause accidental overdose and death if taken by other adults, children, or pets. Mix any unused medicine with a substance like cat litter or coffee grounds. Then throw the medicine away in a sealed container like a sealed  bag or a coffee can with a lid. Do not use the medicine after the expiration date. Store at room temperature between 15 and 30 degrees C (59 and 86 degrees F). Protect from light. Keep container tightly closed. NOTE: This sheet is a summary. It may not cover all possible information. If you have questions about this medicine, talk to your doctor, pharmacist, or health care provider.    2016, Elsevier/Gold Standard. (2015-01-24 14:01:43)    Upper Respiratory Infection, Adult Most upper respiratory infections (URIs) are a viral infection of the air passages leading to the lungs. A URI affects the nose, throat, and upper air passages. The most common type of URI is nasopharyngitis and is typically referred to as "the common cold." URIs run their course and usually go away on their own. Most of the time, a URI does not require medical attention, but sometimes a bacterial infection in the upper airways can follow a viral infection. This is called a secondary infection. Sinus and middle ear infections are common types of secondary upper respiratory infections. Bacterial pneumonia can also complicate a URI. A URI can worsen asthma and chronic obstructive pulmonary disease (COPD). Sometimes, these complications can require emergency medical care and may be life threatening.  CAUSES Almost all URIs are caused by viruses. A virus is a type of germ and can spread from one person to another.  RISKS FACTORS You may be at risk for a URI if:   You smoke.   You have chronic heart or lung disease.  You have a weakened defense (immune) system.  You are very young or very old.   You have nasal allergies or asthma.  You work in crowded or poorly ventilated areas.  You work in health care facilities or schools. SIGNS AND SYMPTOMS  Symptoms typically develop 2-3 days after you come in contact with a cold virus. Most viral URIs last 7-10 days. However, viral URIs from the influenza virus (flu virus) can  last 14-18 days and are typically more severe. Symptoms may include:   Runny or stuffy (congested) nose.   Sneezing.   Cough.   Sore throat.   Headache.   Fatigue.   Fever.   Loss of appetite.   Pain in your forehead, behind your eyes, and over your cheekbones (sinus pain).  Muscle aches.  DIAGNOSIS  Your health care provider may diagnose a URI by:  Physical exam.  Tests to check that your symptoms are not due to another condition such as:  Strep throat.  Sinusitis.  Pneumonia.  Asthma. TREATMENT  A URI goes away on its own with time. It cannot be cured with medicines, but medicines may be prescribed or recommended to relieve symptoms. Medicines may help:  Reduce your fever.  Reduce your cough.  Relieve nasal congestion. HOME CARE INSTRUCTIONS   Take medicines only as directed by your health care provider.   Gargle warm saltwater or take cough drops to comfort your throat as directed by your health care provider.  Use a warm mist humidifier or inhale steam from a shower to increase air moisture. This may make it easier to breathe.  Drink enough fluid to keep your urine clear or pale yellow.   Eat soups and other clear broths and maintain good nutrition.   Rest as needed.   Return to work when your temperature has returned to normal or as your health care provider advises. You may need to stay home longer to avoid infecting others. You can also use a face mask and careful hand washing to prevent spread of the virus.  Increase the usage of your inhaler if you have asthma.   Do not use any tobacco products, including cigarettes, chewing tobacco, or electronic cigarettes. If you need help quitting, ask your health care provider. PREVENTION  The best way to protect yourself from getting a cold is to practice good hygiene.   Avoid oral or hand contact with people with cold symptoms.   Wash your hands often if contact occurs.  There is no  clear evidence that vitamin C, vitamin E, echinacea, or exercise reduces the chance of developing a cold. However, it is always recommended to get plenty of rest, exercise, and practice good nutrition.  SEEK MEDICAL CARE IF:   You are getting worse rather than better.   Your symptoms are not controlled by medicine.   You have chills.  You have worsening shortness of breath.  You have brown or red mucus.  You have yellow or brown nasal discharge.  You have pain in your face, especially when you bend forward.  You have a fever.  You have swollen neck glands.  You have pain while swallowing.  You have white areas in the back of your throat. SEEK IMMEDIATE MEDICAL CARE IF:   You have severe or persistent:  Headache.  Ear pain.  Sinus pain.  Chest pain.  You have chronic lung disease and any of the following:  Wheezing.  Prolonged cough.  Coughing up blood.  A change in your usual mucus.  You have a stiff neck.  You have changes in your:  Vision.  Hearing.  Thinking.  Mood. MAKE SURE YOU:   Understand these instructions.  Will watch your condition.  Will get help right away if you are not doing well or get worse.   This information is not intended to replace advice given to you by your health care provider. Make sure you discuss any questions you have with your health care provider.   Document Released: 04/09/2001 Document Revised: 02/28/2015 Document Reviewed: 01/19/2014 Elsevier Interactive Patient Education [employer]2016 Elsevier Inc.

## 2015-10-27 ENCOUNTER — Telehealth: Payer: Self-pay | Admitting: Family Medicine

## 2015-10-27 NOTE — Telephone Encounter (Signed)
Patient request for a refill on Hycodan 5-1.5 Please call patient at [phone]

## 2015-10-27 NOTE — Telephone Encounter (Signed)
Spoke with pt, advised to RTC. Pt understood. 

## 2015-11-01 ENCOUNTER — Ambulatory Visit (INDEPENDENT_AMBULATORY_CARE_PROVIDER_SITE_OTHER): Payer: BLUE CROSS/BLUE SHIELD | Admitting: Physician Assistant

## 2015-11-01 ENCOUNTER — Ambulatory Visit (INDEPENDENT_AMBULATORY_CARE_PROVIDER_SITE_OTHER): Payer: BLUE CROSS/BLUE SHIELD

## 2015-11-01 VITALS — BP 112/68 | HR 72 | Temp 98.8°F | Resp 18 | Ht 61.75 in | Wt 124.2 lb

## 2015-11-01 DIAGNOSIS — R05 Cough: Secondary | ICD-10-CM

## 2015-11-01 DIAGNOSIS — M546 Pain in thoracic spine: Secondary | ICD-10-CM | POA: Diagnosis not present

## 2015-11-01 DIAGNOSIS — J302 Other seasonal allergic rhinitis: Secondary | ICD-10-CM | POA: Diagnosis not present

## 2015-11-01 DIAGNOSIS — Z3042 Encounter for surveillance of injectable contraceptive: Secondary | ICD-10-CM

## 2015-11-01 DIAGNOSIS — F419 Anxiety disorder, unspecified: Secondary | ICD-10-CM | POA: Diagnosis not present

## 2015-11-01 DIAGNOSIS — R059 Cough, unspecified: Secondary | ICD-10-CM

## 2015-11-01 DIAGNOSIS — M62830 Muscle spasm of back: Secondary | ICD-10-CM

## 2015-11-01 DIAGNOSIS — Z3049 Encounter for surveillance of other contraceptives: Secondary | ICD-10-CM | POA: Diagnosis not present

## 2015-11-01 LAB — POCT CBC
GRANULOCYTE PERCENT: 60.8 % (ref 37–80)
HCT, POC: 37.4 % — AB (ref 37.7–47.9)
HEMOGLOBIN: 12.8 g/dL (ref 12.2–16.2)
Lymph, poc: 2.6 (ref 0.6–3.4)
MCH: 33.7 pg — AB (ref 27–31.2)
MCHC: 34.3 g/dL (ref 31.8–35.4)
MCV: 98.2 fL — AB (ref 80–97)
MID (cbc): 0.6 (ref 0–0.9)
MPV: 6.1 fL (ref 0–99.8)
PLATELET COUNT, POC: 331 10*3/uL (ref 142–424)
POC Granulocyte: 4.9 (ref 2–6.9)
POC LYMPH PERCENT: 32.2 %L (ref 10–50)
POC MID %: 7 %M (ref 0–12)
RBC: 3.81 M/uL — AB (ref 4.04–5.48)
RDW, POC: 13.6 %
WBC: 8 10*3/uL (ref 4.6–10.2)

## 2015-11-01 MED ORDER — CYCLOBENZAPRINE HCL 5 MG PO TABS: 5.0000 mg | ORAL_TABLET | Freq: Three times a day (TID) | ORAL | Status: DC | PRN

## 2015-11-01 MED ORDER — MEDROXYPROGESTERONE ACETATE 150 MG/ML IM SUSY
150.0000 mg | PREFILLED_SYRINGE | Freq: Once | INTRAMUSCULAR | Status: AC
  Administered 2015-11-01: 150 mg via INTRAMUSCULAR

## 2015-11-01 MED ORDER — HYDROCODONE-HOMATROPINE 5-1.5 MG/5ML PO SYRP: 5.0000 mL | ORAL_SOLUTION | Freq: Every evening | ORAL | Status: DC | PRN

## 2015-11-01 MED ORDER — NAPROXEN SODIUM 550 MG PO TABS: 550.0000 mg | ORAL_TABLET | Freq: Two times a day (BID) | ORAL | Status: DC

## 2015-11-01 MED ORDER — CLONAZEPAM 1 MG PO TABS: ORAL_TABLET | ORAL | Status: DC

## 2015-11-01 MED ORDER — MEDROXYPROGESTERONE ACETATE 150 MG/ML IM SUSP: 150.0000 mg | Freq: Once | INTRAMUSCULAR | Status: DC

## 2015-11-01 NOTE — Progress Notes (Signed)
[facility] [address], [place] [address] [phone]  Date:  11/01/2015   Name:  Kristie Thornton   DOB:  September 03, 1991   MRN:  [id number]  PCP:  No PCP Per Patient    Chief Complaint: Back Pain; Contraception; Cough; and Medication Refill   History of Present Illness:  This is a 25 y.o. female with PMH anxiety, endometriosis, bilateral ovarian cysts who is presenting with upper back pain x 5 days. States it feels like there is a knife stabbing in her back. She has been applying over the counter patches for pain (unknown name) but cannot get any relief. Denies paresthesias, arm weakness, shooting pain into arms.  Pt has had a cough x 2 months - since beginning of November 2016. Coughing up green phlegm. When she was here in November she was given tussionex. She returned 1 month later and was given a zpak. Cough got a littler better after the zpak but 2 weeks ago started feel bad again. Having some nasal congestion as well. Gets hot flashes and cold sweats but she hasn't check temp. Hx of env allergies in winter and summer. Pt works at [employer] and states she notices her symptoms are worse when she is at [employer] and better when she is home. She denies sob, wheezing. No LE pain or swelling. No hx or fam hx blood clot. Not on estrogen. No recent travel or immobility.  She is needing a depo injection. Last had 08/04/15.  She is a patient of Dr. Irwin Brakeman. Dr. Conley Rolls writes for [employer]. Pt still has refill left. States she ran out because she took more than she was supposed to over the holidays d/t stress.  Review of Systems:  Review of Systems See HPI  Patient Active Problem List   Diagnosis Date Noted  . Generalized anxiety disorder 08/08/2014  . Endometriosis 08/08/2014  . Bilateral ovarian cysts 08/08/2014    Prior to Admission medications   Medication Sig Start Date End Date Taking? Authorizing Provider  clonazePAM (KLONOPIN) 1 MG tablet Take 1/2-1 tab po BID prn  10/05/15  Yes Wallis Bamberg, PA-C    Allergies  Allergen Reactions  . Sertraline Hcl Other (See Comments)    Suicidality    History reviewed. No pertinent past surgical history.  Social History  Substance Use Topics  . Smoking status: Current Some Day Smoker    Last Attempt to Quit: 07/05/2014  . Smokeless tobacco: Former Neurosurgeon     Comment: previously vaped  . Alcohol Use: No    Family History  Problem Relation Age of Onset  . Cancer Mother     colon cancer  . Cancer Father 15    testicular cancer  . Fibromyalgia Sister   . ADD / ADHD Brother     Medication list has been reviewed and updated.  Physical Examination:  Physical Exam  Constitutional: She is oriented to person, place, and time. She appears well-developed and well-nourished. No distress.  HENT:  Head: Normocephalic and atraumatic.  Right Ear: Hearing, tympanic membrane, external ear and ear canal normal.  Left Ear: Hearing, tympanic membrane, external ear and ear canal normal.  Nose: Mucosal edema present. Right sinus exhibits no maxillary sinus tenderness and no frontal sinus tenderness. Left sinus exhibits no maxillary sinus tenderness and no frontal sinus tenderness.  Mouth/Throat: Uvula is midline, oropharynx is clear and moist and mucous membranes are normal.  Eyes: Conjunctivae and lids are normal. Right eye exhibits no discharge.  Left eye exhibits no discharge. No scleral icterus.  Cardiovascular: Normal rate, regular rhythm, normal heart sounds and normal pulses.   No murmur heard. Pulmonary/Chest: Effort normal and breath sounds normal. No respiratory distress. She has no decreased breath sounds. She has no wheezes. She has no rhonchi. She has no rales.  Abdominal: Soft. Normal appearance. There is no tenderness.  No epigastric tenderness  Musculoskeletal: Normal range of motion.       Thoracic back: She exhibits tenderness (point tender midline thoracic spine. More mild pain bilateral paraspinal) and  bony tenderness. She exhibits no swelling.       Back:  Lymphadenopathy:       Head (right side): No submental, no submandibular and no tonsillar adenopathy present.       Head (left side): No submental, no submandibular and no tonsillar adenopathy present.    She has no cervical adenopathy.  Neurological: She is alert and oriented to person, place, and time. She has normal strength. No sensory deficit. Gait normal.  Skin: Skin is warm, dry and intact. No lesion and no rash noted.  No LE edema, erythema, tenderness  Psychiatric: She has a normal mood and affect. Her speech is normal and behavior is normal. Thought content normal.    BP 112/68 mmHg  Pulse 72  Temp(Src) 98.8 F (37.1 C)  Resp 18  Ht 5' 1.75" (1.568 m)  Wt 124 lb 3.2 oz (56.337 kg)  BMI 22.91 kg/m2  SpO2 98%  LMP 07/27/2015  Results for orders placed or performed in visit on 11/01/15  POCT CBC  Result Value Ref Range   WBC 8.0 4.6 - 10.2 K/uL   Lymph, poc 2.6 0.6 - 3.4   POC LYMPH PERCENT 32.2 10 - 50 %L   MID (cbc) 0.6 0 - 0.9   POC MID % 7.0 0 - 12 %M   POC Granulocyte 4.9 2 - 6.9   Granulocyte percent 60.8 37 - 80 %G   RBC 3.81 (A) 4.04 - 5.48 M/uL   Hemoglobin 12.8 12.2 - 16.2 g/dL   HCT, POC 95.237.4 (A) 84.137.7 - 47.9 %   MCV 98.2 (A) 80 - 97 fL   MCH, POC 33.7 (A) 27 - 31.2 pg   MCHC 34.3 31.8 - 35.4 g/dL   RDW, POC 32.413.6 %   Platelet Count, POC 331 142 - 424 K/uL   MPV 6.1 0 - 99.8 fL   [facility] reading (PRIMARY) by  Dr. Conley RollsLe: negative.  IMPRESSION: No edema or consolidation.  IMPRESSION: Slight scoliosis. No fracture or spondylolisthesis. No appreciable arthropathy.  Assessment and Plan:  1. Midline thoracic back pain 2. Muscle spasm of back Likely muscle strain d/t coughing. Radiograph negative. No risk factors for blood clot and vitals normal. Treat with flexeril and anaprox. Return if sx not improving in 2 weeks or at any time if sx worsen. - DG Thoracic Spine 2 View; Future - cyclobenzaprine  (FLEXERIL) 5 MG tablet; Take 1 tablet (5 mg total) by mouth 3 (three) times daily as needed for muscle spasms.  Dispense: 30 tablet; Refill: 0 - naproxen sodium (ANAPROX DS) 550 MG tablet; Take 1 tablet (550 mg total) by mouth 2 (two) times daily with a meal.  Dispense: 30 tablet; Refill: 0  3. Seasonal allergic rhinitis 4. Cough Suspect cough d/t allergic rhinitis. Cough [place] worse [place] [employer]. She has been treated with tussionex and zpak in the past, without complete help. Advised she start taking zyrtec daily. Refilled  tussionex for LAST time. Return if cough not improving in 1 month. - POCT CBC - DG Chest 2 View; Future - HYDROcodone-homatropine (HYCODAN) 5-1.5 MG/5ML syrup; Take 5 mLs by mouth at bedtime as needed.  Dispense: 100 mL; Refill: 0  5. Anxiety Used more klonopin than prescribed over holidays. She still has scripts to fill in 4 days. I did not give her any klonopin today. Follow up with Dr. Conley Rolls in 2 months.  6. Depo contraception - MedroxyPROGESTERone Acetate SUSY 150 mg; Inject 1 mL (150 mg total) into the muscle once.   [place] Brackett, MHS [facility]  11/07/2015

## 2015-11-01 NOTE — Patient Instructions (Signed)
Take flexeril as needed for back pain Anaprox twice a day. Do not take any other nsaids - no ibuprofen, motrin, aleve, aspirin. Can take tylenol. Start taking zyrtec daily for allergies. This should help your cough. You may continue to take cough syrup at night as needed. Return in 2 months to follow up with Dr. Conley RollsLe.

## 2015-12-13 ENCOUNTER — Telehealth: Payer: Self-pay

## 2015-12-13 NOTE — Telephone Encounter (Signed)
PATIENT STATES SHE IS IN [place] ON BUSINESS AND SHE HAD TO GO TO A [facility] THERE. THEY TOLD HER THAT SINCE SHE IS OUT-OF-STATE THAT SHE NEEDS TO GET DR. LE TO FAX TO THEM HER MEDICATION HISTORY FOR HER CONTROL SUBSTANCES. SHE NEEDS FLEXERIL, HYCODAN FOR HER RE-OCCURRING COUGH & KLONOPIN. SHE SAID SHE IS COMPLETELY OUT SO SHE WOULD LIKE TO GET A CALL BACK AS SOON AS POSSIBLE. WHEN SHE IS CALLED BACK SHE WILL HAVE THE FAX NUMBER TO FAX THE MEDICATION HISTORY TO. BEST PHONE [phone] (CELL)  MBC

## 2015-12-14 ENCOUNTER — Telehealth: Payer: Self-pay | Admitting: Family Medicine

## 2015-12-14 NOTE — Telephone Encounter (Signed)
Lm on cell, we can fax her med list to where it needs to go

## 2015-12-20 ENCOUNTER — Ambulatory Visit (INDEPENDENT_AMBULATORY_CARE_PROVIDER_SITE_OTHER): Payer: BLUE CROSS/BLUE SHIELD | Admitting: Family Medicine

## 2015-12-20 VITALS — BP 111/75 | HR 73 | Temp 98.7°F | Resp 16 | Ht 61.0 in | Wt 126.0 lb

## 2015-12-20 DIAGNOSIS — M546 Pain in thoracic spine: Secondary | ICD-10-CM

## 2015-12-20 DIAGNOSIS — F1920 Other psychoactive substance dependence, uncomplicated: Secondary | ICD-10-CM

## 2015-12-20 DIAGNOSIS — F419 Anxiety disorder, unspecified: Secondary | ICD-10-CM | POA: Diagnosis not present

## 2015-12-20 DIAGNOSIS — G4700 Insomnia, unspecified: Secondary | ICD-10-CM

## 2015-12-20 MED ORDER — MIRTAZAPINE 15 MG PO TABS: 15.0000 mg | ORAL_TABLET | Freq: Every day | ORAL | Status: DC

## 2015-12-20 MED ORDER — CYCLOBENZAPRINE HCL 5 MG PO TABS: 5.0000 mg | ORAL_TABLET | Freq: Three times a day (TID) | ORAL | Status: DC | PRN

## 2015-12-20 MED ORDER — CLONAZEPAM 1 MG PO TABS: 1.0000 mg | ORAL_TABLET | Freq: Two times a day (BID) | ORAL | Status: DC
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
## 2015-12-20 NOTE — Patient Instructions (Signed)
RESOURCE GUIDE ° °Chronic Pain Problems: °Contact  Chronic Pain Clinic  [phone] °Patients need to be referred by their primary care doctor. ° °Insufficient Money for Medicine: °Contact United Way:  call (888) 892-1162 ° °No Primary Care Doctor: °- Call [facility] Connect  [phone] - can help you locate a primary care doctor that  accepts your insurance, provides certain services, etc. °- Physician Referral Service- 1-800-533-3463 ° °Agencies that provide inexpensive medical care: °- [facility]  [phone] °- [facility]  [phone] °- [facility]  [phone] °- [facility]  [phone] °- [facility]  [phone] °- [facility]  [phone] ° °Medicaid-accepting [place] Providers: °- [facility]- [address] ° [phone], Mon-Fri 9am-7pm, Sat 9am-1pm °- [facility]- [address], [address] ° [phone] °- [facility]- [address] ° [phone] °- [facility]- [address] [place] [address] ° [phone] °- Ve[facility]- [address], [phone] ° Only accepts [place] Access Medicaid patients after they have their name  applied to their card ° °Self Pay (no insurance) in [place]: °- Sickle Cell Patients - [facility] ° [address], [phone] °- [facility]- [address] ° [phone] °      -      [facility] [place]- [address] °      -     [facility]- see information above (Speak to Pam H if you do not have insurance) °      -  [facility] [place]- [address],  [phone] °      -  [facility]- [address] [place] [address], [phone] °      -  Dr Osei-Bonsu-  [address], [place], [phone] °      -  [facility] - [address], [phone] °      -  [facility]- [address] [place] [address], [phone], also  [address], [phone] °      -     [facility]- [address], [phone], 1st & 3rd Saturday °        every month, 10am-1pm ° -     [facility] °  [address], [place]. °  Phone:  [phone], Fax:  [phone]. Hours of Operation:  9 am - 6 pm, M-F. ° -     [facility] °  [address], [place] °  Phone: [phone], Fax: [phone]. Hours of Operation:  8:30 am - 5:30 pm, M-F. ° °[facility] °[address] °[place], [place] [address] °(336) [phone] ° °[facility] °[address] °[place], [place] [address] °(336) [phone] ° °1) Find a Doctor and Pay Out of Pocket °Although you won't have to find out who is covered by your insurance plan, it is a good idea to ask around and get recommendations. You will then need to call the office and see if the doctor you have chosen will accept you as a new patient and what types of options they offer for patients who are self-pay. Some doctors offer discounts or will set up payment plans for their patients who do not have insurance, but   you will need to ask so you aren't surprised when you get to your appointment. ° °2) Contact Your Local Health Department °Not all health departments have doctors that can see patients for sick visits, but many do, so it is worth a call to see if yours does. If you don't know where your local health department is, you can check in your phone book. The CDC also has a tool to help you locate your state's health department, and many state websites also have listings of all of their local health departments. ° °3) Find a Walk-in Clinic °If your illness is not likely to be very severe or complicated, you may want to try a walk in clinic. These are popping up all over the country in pharmacies, drugstores, and shopping centers. They're usually staffed by nurse practitioners or physician assistants that have been trained  to treat common illnesses and complaints. They're usually fairly quick and inexpensive. However, if you have serious medical issues or chronic medical problems, these are probably not your best option ° °STD Testing °- [place] [facility] [place], [facility], [address], [place], phone [phone] or [phone].  Monday - Friday, call for an appointment. °- [place] [facility] [place], [facility], [address], [place], phone [phone] or [phone].  Monday - Friday, call for an appointment. ° °Abuse/Neglect: °- [place] Child Abuse Hotline (336) 641-3795 °- [place] Child Abuse Hotline 800-378-5315 (After Hours) ° °Emergency Shelter:  [facility] (336) 271-5985 ° °Maternity Homes: °- [facility] (336) 275-9566 °- [facility] (704) 372-4663 ° °MRSA Hotline #:   832-7006 ° °Dental Assistance °If unable to pay or uninsured, contact:  [place] [facility] to become qualified for the adult dental clinic. ° °Patients with Medicaid: [facility] °[address], 632-0744 °[address], [phone] ° °If unable to pay, or uninsured, contact [place] [facility] ([phone] in [place], 842-7733 in [place]) to become qualified for the adult dental clinic ° °[facility] °[address] °[place], [place] [address] °(336) 272-4177 °www.[facility].com ° °Other Low-Cost Community Dental Services: °- [facility]- [address], [place], [place], [address], 723-1848, Ext. [address], 2nd and 4th Thursday of the month at 6:30am.  10 clients each day by appointment, can sometimes see walk-in patients if someone does not show for an appointment. °- [facility]- [address], [place], [place], [address], [phone] °- [facility]- [address], [place], [place], [address], 631-2330 °- [place]  [facility]- 342-8273 °- [facility]- 703-3100 °- [facility]- 570-6415 ° °     Behavioral Health Resources in the Community ° °Intensive Outpatient Programs: °[facility]      °[address] °[place], [place] °336-878-6098 °Both a day and evening program °      °[facility]     °[address]        °[place], [place] [address] °336-832-9800        ° °[facility] °[address] °[place] °336-882-2125 ° °[facility] °ACCESS LINE: 1-800-853-5163 or 336-641-4981 °[address] °, [place] [address] °Http://www.[email or web address].[email or web address]/adult.htm ° ° °Substance Abuse Resources: °- [facility]  336-882-2125 °- [facility] 336-784-9470 °- [facility] 336-285-9073 °- [facility] 336-845-3988 °-   Residential & Outpatient Substance Abuse Program  800-659-3381 ° °Psychological Services: °- [facility]  832-9600 °- [facility]  378-7881 °- [facility], [address], [place], ACCESS LINE: 1-800-853-5163 or 336-641-4981, Http://www.[facility].com/services/adult.htm ° °Mobile Crisis Teams:         °                               °[facility]         °Mobile Crisis Care Unit °1-877-626-1772       °      °Assertive °[facility] °[address] [place] °336-834-9664 °                                        °[facility] °Sharon DeEsch °[address] °[place] °336-554-5454 ° °Self-Help/Support Groups: °[facility] Variety of support groups °373-1402 (call for more info) ° °Narcotics Anonymous (NA) °[facility] °[address] °[place] - 2 meetings at this location ° °Residential Treatment Programs:  °[facility]      °[address]        °[place]       °866-801-8205        ° °[facility] °[address] °[place], [place]  [address] °704-293-8524 ° °[facility]  °[address] °[place], [place] [address] °336-845-3988 °Admissions: 8am-3pm M-F ° °[facility]     °[address]        °[place], [place] [address]       °336-841-1104        ° °[facility] °[address] °[place], [place] °336-379-7146 ° °[facility] °[address] °[place], [place] °336-285-9073 ° °[facility] - Intensive Outpatient      °[address]     °[place], [place]       °852-3033        ° °[facility]     °[address] °[place], [place] °877-615-2722 or 336-784-9470 ° °[facility] ([facility]), Medicaid °[address] °[place], [place] °336-227-7417 ° °[facility]                                               °[address] ° [place] °800-659-3381 ° °[place] [facility] Resources: °[facility]- 1-888-581-9988              ° °General Therapy                                                °Julie Brannon, PhD        °[address]                                       °[place], [place] [address]         °336-349-5553   °Insurance ° °[facility]   °[address] °[place], [place] [address] °336-349-4454 ° °[facility] °[address] [place], [place] [address] °336-342-8316 °Insurance/Medicaid/sponsorship through [facility] ° °[facility]                                              °[address]                                        °[place], [place] [address]    °Therapy/tele-psych/case         °336-342-8316        °  °[facility] °[address]  ° [place], [place]  [address]  °Adolescent/group home/case management °336-349-2233  °                                         °Julia Brannon PhD       °General therapy       °Insurance   °336-951-0000        ° °Dr. Arfeen, Insurance, M-F °336- 349-4544 ° °[facility] [place]  [facility]  [employer] [place] [facility] °[address]                 [address]         [address]  °[place]                                               [place] °Phone:  349-3220                                  Phone:  [phone]                   Phone:  342-8140 ° °[facility], 342-8316 °- [place] [facility] - [facility]- 1-888-581-9988 °      -     [facility] in [place], [address], °            336-349-4454, Insurance ° °[place] Child Abuse Hotline °(336) 342-1394 or (336) 342-3537 (After Hours) ° ° °

## 2016-01-17 ENCOUNTER — Ambulatory Visit (INDEPENDENT_AMBULATORY_CARE_PROVIDER_SITE_OTHER): Payer: BLUE CROSS/BLUE SHIELD | Admitting: Family Medicine

## 2016-01-17 VITALS — BP 120/80 | HR 94 | Temp 99.4°F | Resp 20 | Ht 61.0 in | Wt 129.8 lb

## 2016-01-17 DIAGNOSIS — R350 Frequency of micturition: Secondary | ICD-10-CM

## 2016-01-17 DIAGNOSIS — R059 Cough, unspecified: Secondary | ICD-10-CM

## 2016-01-17 DIAGNOSIS — R05 Cough: Secondary | ICD-10-CM

## 2016-01-17 DIAGNOSIS — L74512 Primary focal hyperhidrosis, palms: Secondary | ICD-10-CM | POA: Diagnosis not present

## 2016-01-17 DIAGNOSIS — N898 Other specified noninflammatory disorders of vagina: Secondary | ICD-10-CM | POA: Diagnosis not present

## 2016-01-17 LAB — POCT INFLUENZA A/B
INFLUENZA A, POC: NEGATIVE
Influenza B, POC: NEGATIVE

## 2016-01-17 LAB — POCT WET + KOH PREP
Yeast by KOH: ABSENT
Yeast by wet prep: ABSENT

## 2016-01-17 LAB — POC MICROSCOPIC URINALYSIS (UMFC): Mucus: ABSENT

## 2016-01-17 LAB — POCT URINALYSIS DIP (MANUAL ENTRY)
BILIRUBIN UA: NEGATIVE
BILIRUBIN UA: NEGATIVE
Blood, UA: NEGATIVE
Glucose, UA: NEGATIVE
Nitrite, UA: NEGATIVE
Protein Ur, POC: NEGATIVE
SPEC GRAV UA: 1.02
Urobilinogen, UA: 0.2
pH, UA: 6.5

## 2016-01-17 MED ORDER — CEPHALEXIN 500 MG PO CAPS: 500.0000 mg | ORAL_CAPSULE | Freq: Three times a day (TID) | ORAL | Status: DC

## 2016-01-17 NOTE — Patient Instructions (Addendum)
   IF you received an x-ray today, you will receive an invoice from [facility]. Please contact [facility] at [phone] with questions or concerns regarding your invoice.   IF you received labwork today, you will receive an invoice from [facility]. Please contact [facility] at [phone] with questions or concerns regarding your invoice.   Our billing staff will not be able to assist you with questions regarding bills from these companies.  You will be contacted with the lab results as soon as they are available. The fastest way to get your results is to activate your My Chart account. Instructions are located on the last page of this paperwork. If you have not heard from us regarding the results in 2 weeks, please contact this office.     Urinary Tract Infection Urinary tract infections (UTIs) can develop anywhere along your urinary tract. Your urinary tract is your body's drainage system for removing wastes and extra water. Your urinary tract includes two kidneys, two ureters, a bladder, and a urethra. Your kidneys are a pair of bean-shaped organs. Each kidney is about the size of your fist. They are located below your ribs, one on each side of your spine. CAUSES Infections are caused by microbes, which are microscopic organisms, including fungi, viruses, and bacteria. These organisms are so small that they can only be seen through a microscope. Bacteria are the microbes that most commonly cause UTIs. SYMPTOMS  Symptoms of UTIs may vary by age and gender of the patient and by the location of the infection. Symptoms in young women typically include a frequent and intense urge to urinate and a painful, burning feeling in the bladder or urethra during urination. Older women and men are more likely to be tired, shaky, and weak and have muscle aches and abdominal pain. A fever may mean the infection is in your kidneys. Other symptoms of a kidney  infection include pain in your back or sides below the ribs, nausea, and vomiting. DIAGNOSIS To diagnose a UTI, your caregiver will ask you about your symptoms. Your caregiver will also ask you to provide a urine sample. The urine sample will be tested for bacteria and white blood cells. White blood cells are made by your body to help fight infection. TREATMENT  Typically, UTIs can be treated with medication. Because most UTIs are caused by a bacterial infection, they usually can be treated with the use of antibiotics. The choice of antibiotic and length of treatment depend on your symptoms and the type of bacteria causing your infection. HOME CARE INSTRUCTIONS  If you were prescribed antibiotics, take them exactly as your caregiver instructs you. Finish the medication even if you feel better after you have only taken some of the medication.  Drink enough water and fluids to keep your urine clear or pale yellow.  Avoid caffeine, tea, and carbonated beverages. They tend to irritate your bladder.  Empty your bladder often. Avoid holding urine for long periods of time.  Empty your bladder before and after sexual intercourse.  After a bowel movement, women should cleanse from front to back. Use each tissue only once. SEEK MEDICAL CARE IF:   You have back pain.  You develop a fever.  Your symptoms do not begin to resolve within 3 days. SEEK IMMEDIATE MEDICAL CARE IF:   You have severe back pain or lower abdominal pain.  You develop chills.  You have nausea or vomiting.  You have continued burning or discomfort with urination.   MAKE SURE YOU:   Understand these instructions.  Will watch your condition.  Will get help right away if you are not doing well or get worse.   This information is not intended to replace advice given to you by your health care provider. Make sure you discuss any questions you have with your health care provider.   Document Released: 07/24/2005 Document  Revised: 07/05/2015 Document Reviewed: 11/22/2011 Elsevier Interactive Patient Education 2016 [employer]  

## 2016-01-17 NOTE — Progress Notes (Signed)
Subjective:    Patient ID: Kristie Thornton, female    DOB: [birth year], 25 y.o.   MRN: [id number]  01/17/2016  Vaginal Discharge and Urinary Tract Infection   HPI This 25 y.o. female presents for evaluation of the following:  1.  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  Still sweats with flip flops.  Gold bond is not helping.  WebMD research.   2.  Ovarian cyst: chronic; three years ago; worried about ovarian cancer;   3. Urinary frequency:  Worried about ovarian cancer. Nocturia x 4-5; going for two weeks.  Worried about UTI or yeast infection.  Urinary incontinence with coughing.  4. Vaginal discharge: very watery and more frequent than normal.  [place] green and white.    Review of Systems  Constitutional: Negative for fever, chills, diaphoresis and fatigue.  Eyes: Negative for visual disturbance.  Respiratory: Negative for cough and shortness of breath.   Cardiovascular: Negative for chest pain, palpitations and leg swelling.  Gastrointestinal: Negative for nausea, vomiting, abdominal pain, diarrhea and constipation.  Endocrine: Negative for cold intolerance, heat intolerance, polydipsia, polyphagia and polyuria.  Neurological: Negative for dizziness, tremors, seizures, syncope, facial asymmetry, speech difficulty, weakness, light-headedness, numbness and headaches.    Past Medical History  Diagnosis Date  . Anxiety    No past surgical history on file. Allergies  Allergen Reactions  . Sertraline Hcl Other (See Comments)    Suicidality    Social History   Social History  . Marital Status: Single    Spouse Name: n/a  . Number of Children: 0  . Years of Education: 12th +   Occupational History  . sales     [employer]   Social History Main Topics  . Smoking status: Current Some Day Smoker    Last Attempt to Quit: 07/05/2014  . Smokeless tobacco: Former Neurosurgeon     Comment:  previously vaped  . Alcohol Use: No  . Drug Use: No  . Sexual Activity:    Partners: Male    Birth Control/ Protection: Pill   Other Topics Concern  . Not on file   Social History Narrative   Parents are from [place]. Parents and her siblings were born in the [place].      Marital status: single; not dating      Children:  None     Lives: alone      Employment: unemployed      Tobacco:  None      Alcohol: weekends; no DWIs      Drugs: marijuana socially         Family History  Problem Relation Age of Onset  . Cancer Mother     colon cancer  . Cancer Father 61    testicular cancer  . Fibromyalgia Sister   . ADD / ADHD Brother        Objective:    BP 120/80 mmHg  Pulse 94  Temp(Src) 99.4 F (37.4 C) (Oral)  Resp 20  Ht  (1.549 m)  Wt 129 lb 12.8 oz (58.877 kg)  BMI 24.54 kg/m2  SpO2 98% Physical Exam  Constitutional: She is oriented to person, place, and time. She appears well-developed and well-nourished. No distress.  HENT:  Head: Normocephalic and atraumatic.  Right Ear: External ear normal.  Left Ear: External ear normal.  Nose: Nose normal.  Mouth/Throat: Oropharynx is clear and moist.  Eyes: Conjunctivae and EOM are normal. Pupils are equal, round, and reactive to light.  Neck: Normal range of motion. Neck supple. Carotid bruit is not present. No thyromegaly present.  Cardiovascular: Normal rate, regular rhythm, normal heart sounds and intact distal pulses.  Exam reveals no gallop and no friction rub.   No murmur heard. Pulmonary/Chest: Effort normal and breath sounds normal. She has no wheezes. She has no rales.  Abdominal: Soft. Bowel sounds are normal. She exhibits no distension and no mass. There is no tenderness. There is no rebound and no guarding.  Genitourinary: Uterus normal. There is no rash, tenderness or lesion on the right labia. There is no rash, tenderness or lesion on the left labia. Cervix exhibits no motion tenderness, no discharge and no  friability. Right adnexum displays no mass and no tenderness. Left adnexum displays no mass and no tenderness. Vaginal discharge found.  Copious thin yellow discharge in vaginal vault.  Lymphadenopathy:    She has no cervical adenopathy.  Neurological: She is alert and oriented to person, place, and time. No cranial nerve deficit.  Skin: Skin is warm and dry. No rash noted. She is not diaphoretic. No erythema. No pallor.  Psychiatric: She has a normal mood and affect. Her behavior is normal.        Assessment & Plan:   1. Frequent urination   2. Vaginal discharge   3. Cough   4. Sweaty palms     Orders Placed This Encounter  Procedures  . Urine culture  . GC/Chlamydia Probe Amp    Order Specific Question:  Source    Answer:  Genital  . CBC with Differential/Platelet  . Comprehensive metabolic panel  . TSH  . RPR  . HIV antibody  . POCT Microscopic Urinalysis ([facility])  . POCT urinalysis dipstick  . POCT Wet + KOH Prep  . POCT Influenza A/B   Meds ordered this encounter  Medications  . cephALEXin (KEFLEX) 500 MG capsule    Sig: Take 1 capsule (500 mg total) by mouth 3 (three) times daily.    Dispense:  21 capsule    Refill:  0  . DISCONTD: azithromycin (ZITHROMAX) 250 MG tablet    Sig: 4 tablets po at once    Dispense:  4 tablet    Refill:  0  . DISCONTD: metroNIDAZOLE (FLAGYL) 500 MG tablet    Sig: Take 4 tablets (2,000 mg total) by mouth once.    Dispense:  4 tablet    Refill:  0    No Follow-up on file.    Iyan Flett Paulita FujitaMartin Hermina Barnard, M.D. [facility]  [place] [address] [place], [place]  [id number] [phone] phone [phone] fax

## 2016-01-18 LAB — TSH: TSH: 2.33 m[IU]/L

## 2016-01-18 LAB — HIV ANTIBODY (ROUTINE TESTING W REFLEX): HIV: NONREACTIVE

## 2016-01-18 LAB — COMPREHENSIVE METABOLIC PANEL
ALBUMIN: 4.4 g/dL (ref 3.6–5.1)
ALT: 17 U/L (ref 6–29)
AST: 14 U/L (ref 10–30)
Alkaline Phosphatase: 42 U/L (ref 33–115)
BUN: 8 mg/dL (ref 7–25)
CALCIUM: 9.3 mg/dL (ref 8.6–10.2)
CHLORIDE: 106 mmol/L (ref 98–110)
CO2: 25 mmol/L (ref 20–31)
Creat: 1.02 mg/dL (ref 0.50–1.10)
Glucose, Bld: 87 mg/dL (ref 65–99)
POTASSIUM: 4.1 mmol/L (ref 3.5–5.3)
Sodium: 140 mmol/L (ref 135–146)
TOTAL PROTEIN: 7.4 g/dL (ref 6.1–8.1)
Total Bilirubin: 0.3 mg/dL (ref 0.2–1.2)

## 2016-01-18 LAB — CBC WITH DIFFERENTIAL/PLATELET
BASOS PCT: 0 % (ref 0–1)
Basophils Absolute: 0 10*3/uL (ref 0.0–0.1)
Eosinophils Absolute: 0.2 10*3/uL (ref 0.0–0.7)
Eosinophils Relative: 3 % (ref 0–5)
HEMATOCRIT: 40.7 % (ref 36.0–46.0)
HEMOGLOBIN: 14.2 g/dL (ref 12.0–15.0)
LYMPHS ABS: 3.3 10*3/uL (ref 0.7–4.0)
LYMPHS PCT: 42 % (ref 12–46)
MCH: 34.7 pg — AB (ref 26.0–34.0)
MCHC: 34.9 g/dL (ref 30.0–36.0)
MCV: 99.5 fL (ref 78.0–100.0)
MONOS PCT: 5 % (ref 3–12)
MPV: 8.4 fL — ABNORMAL LOW (ref 8.6–12.4)
Monocytes Absolute: 0.4 10*3/uL (ref 0.1–1.0)
NEUTROS ABS: 3.9 10*3/uL (ref 1.7–7.7)
NEUTROS PCT: 50 % (ref 43–77)
Platelets: 286 10*3/uL (ref 150–400)
RBC: 4.09 MIL/uL (ref 3.87–5.11)
RDW: 13.3 % (ref 11.5–15.5)
WBC: 7.8 10*3/uL (ref 4.0–10.5)

## 2016-01-19 ENCOUNTER — Other Ambulatory Visit: Payer: Self-pay | Admitting: Family Medicine

## 2016-01-19 ENCOUNTER — Telehealth: Payer: Self-pay

## 2016-01-19 LAB — URINE CULTURE
Colony Count: NO GROWTH
ORGANISM ID, BACTERIA: NO GROWTH

## 2016-01-19 LAB — GC/CHLAMYDIA PROBE AMP
CT PROBE, AMP APTIMA: DETECTED — AB
GC PROBE AMP APTIMA: NOT DETECTED

## 2016-01-19 LAB — RPR

## 2016-01-19 MED ORDER — METRONIDAZOLE 500 MG PO TABS: 2000.0000 mg | ORAL_TABLET | Freq: Once | ORAL | Status: DC

## 2016-01-19 MED ORDER — AZITHROMYCIN 250 MG PO TABS: ORAL_TABLET | ORAL | Status: DC

## 2016-01-19 NOTE — Telephone Encounter (Signed)
Noted. Thanks.

## 2016-01-19 NOTE — Telephone Encounter (Signed)
Patient called back and was given lab results

## 2016-01-20 ENCOUNTER — Telehealth: Payer: Self-pay

## 2016-01-20 DIAGNOSIS — A749 Chlamydial infection, unspecified: Secondary | ICD-10-CM

## 2016-01-20 DIAGNOSIS — A5901 Trichomonal vulvovaginitis: Secondary | ICD-10-CM

## 2016-01-20 MED ORDER — ONDANSETRON 4 MG PO TBDP: ORAL_TABLET | ORAL | Status: DC

## 2016-01-20 MED ORDER — METRONIDAZOLE 500 MG PO TABS: 2000.0000 mg | ORAL_TABLET | Freq: Once | ORAL | Status: DC

## 2016-01-20 MED ORDER — AZITHROMYCIN 250 MG PO TABS: ORAL_TABLET | ORAL | Status: DC

## 2016-01-20 NOTE — Telephone Encounter (Signed)
Called pt regarding lab results. Pt took Zithromax and Flagyl at the same time. She threw up about an hour after taking medication. Spoke to Benny LennertSarah Weber. Maralyn SagoSarah will send in Zithromax and Flagyl for patient to take again. She will also send in Zofran for patient to take 30 minutes before taking Zithromax and Flagyl. Patient is not to take Zithromax and Flagyl in the same day.

## 2016-01-20 NOTE — Telephone Encounter (Signed)
Spoke with patient 1 month called in needs a follow up

## 2016-01-24 ENCOUNTER — Telehealth: Payer: Self-pay | Admitting: *Deleted

## 2016-01-24 DIAGNOSIS — A5901 Trichomonal vulvovaginitis: Secondary | ICD-10-CM

## 2016-01-24 DIAGNOSIS — A749 Chlamydial infection, unspecified: Secondary | ICD-10-CM

## 2016-01-26 NOTE — Telephone Encounter (Signed)
Patient called stating that she went to pick up her medication at the pharmacy but she left them in her pants and they were washed.  Patient wants an refill of all 3 medications that were given to her.  Both antibiotics and the Zofran.  However, patient was very concerned with the symptoms of an female (her boyfriend)  and if he could take the same medication as her.  She did indicate in the mist of her conversation that she was not trying to get medication for him.  I advised her that I would have to send this message over for authorization.  Pt understood.

## 2016-01-28 MED ORDER — METRONIDAZOLE 500 MG PO TABS: 2000.0000 mg | ORAL_TABLET | Freq: Once | ORAL | Status: DC

## 2016-01-28 MED ORDER — AZITHROMYCIN 250 MG PO TABS: ORAL_TABLET | ORAL | Status: DC

## 2016-01-28 MED ORDER — ONDANSETRON 4 MG PO TBDP
ORAL_TABLET | ORAL | Status: DC
Start: 2016-01-28 — End: 2016-02-26

## 2016-01-28 NOTE — Telephone Encounter (Signed)
Call --- all 3 rx sent in AGAIN to the pharmacy.  Please advise patient.

## 2016-01-29 NOTE — Telephone Encounter (Signed)
Pt advised.

## 2016-02-26 ENCOUNTER — Ambulatory Visit (INDEPENDENT_AMBULATORY_CARE_PROVIDER_SITE_OTHER): Payer: BLUE CROSS/BLUE SHIELD | Admitting: Family Medicine

## 2016-02-26 VITALS — BP 110/62 | HR 79 | Temp 99.1°F | Resp 17 | Ht 62.0 in | Wt 125.0 lb

## 2016-02-26 DIAGNOSIS — R5383 Other fatigue: Secondary | ICD-10-CM | POA: Diagnosis not present

## 2016-02-26 DIAGNOSIS — F419 Anxiety disorder, unspecified: Secondary | ICD-10-CM

## 2016-02-26 DIAGNOSIS — M62830 Muscle spasm of back: Secondary | ICD-10-CM

## 2016-02-26 DIAGNOSIS — G4700 Insomnia, unspecified: Secondary | ICD-10-CM | POA: Diagnosis not present

## 2016-02-26 DIAGNOSIS — Z124 Encounter for screening for malignant neoplasm of cervix: Secondary | ICD-10-CM | POA: Diagnosis not present

## 2016-02-26 DIAGNOSIS — R110 Nausea: Secondary | ICD-10-CM

## 2016-02-26 DIAGNOSIS — Z202 Contact with and (suspected) exposure to infections with a predominantly sexual mode of transmission: Secondary | ICD-10-CM | POA: Diagnosis not present

## 2016-02-26 DIAGNOSIS — Z01419 Encounter for gynecological examination (general) (routine) without abnormal findings: Secondary | ICD-10-CM

## 2016-02-26 LAB — POCT URINALYSIS DIP (MANUAL ENTRY)
BILIRUBIN UA: NEGATIVE
GLUCOSE UA: NEGATIVE
Ketones, POC UA: NEGATIVE
LEUKOCYTES UA: NEGATIVE
NITRITE UA: NEGATIVE
Protein Ur, POC: NEGATIVE
RBC UA: NEGATIVE
Spec Grav, UA: 1.01
UROBILINOGEN UA: 0.2
pH, UA: 7.5

## 2016-02-26 LAB — POC MICROSCOPIC URINALYSIS (UMFC): MUCUS RE: ABSENT

## 2016-02-26 LAB — POCT URINE PREGNANCY: PREG TEST UR: NEGATIVE

## 2016-02-26 MED ORDER — CYCLOBENZAPRINE HCL 5 MG PO TABS: 5.0000 mg | ORAL_TABLET | Freq: Three times a day (TID) | ORAL | Status: DC | PRN

## 2016-02-26 MED ORDER — CLONAZEPAM 1 MG PO TABS: ORAL_TABLET | ORAL | Status: DC

## 2016-02-26 MED ORDER — PANTOPRAZOLE SODIUM 40 MG PO TBEC: 40.0000 mg | DELAYED_RELEASE_TABLET | Freq: Every day | ORAL | Status: DC

## 2016-02-26 NOTE — Patient Instructions (Addendum)
Use the clonazepam 1/2-1 pill maximum of twice daily, but recommend you try to minimize regular use  Recommend discontinuing use of marijuana  Drink more water regularly  Try to get daily exercise which will help you sleep  Use the muscle relaxant only when needed for back spasms, Flexeril (cyclobenzaprine)  I will let you know the results of your Pap and STD testing in a few days.  If you choose to be sexually involved, always use protection 100% of the time.  If NG level does not seem to be increasing or if new problems develop please return  Take pantoprazole 40 mg 1 daily for stomach  Return as needed    IF you received an x-ray today, you will receive an invoice from [facility]. Please contact [facility] at [phone] with questions or concerns regarding your invoice.   IF you received labwork today, you will receive an invoice from [facility]. Please contact [facility] at [phone] with questions or concerns regarding your invoice.   Our billing staff will not be able to assist you with questions regarding bills from these companies.  You will be contacted with the lab results as soon as they are available. The fastest way to get your results is to activate your My Chart account. Instructions are located on the last page of this paperwork. If you have not heard from us regarding the results in 2 weeks, please contact this office.

## 2016-02-26 NOTE — Progress Notes (Signed)
Patient ID: Kristie Thornton, female    DOB: May 13, 1991  Age: 25 y.o. MRN: [id number]  Chief Complaint  Patient presents with  . Nausea  . Medication Refill    klonopin,flexeril  . Fatigue  . Exposure to STD    Subjective:   Patient is here for a regular check regarding several problems and a Pap smear. Pap was not done because she had acute infection a couple weeks ago. She has had a lot of trouble with nausea. No breast tenderness. Her last menstrual cycle is uncertain. She stopped her pills a month ago. She intermittently uses contraception with condoms. She's had a lot of fatigue. 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 She has taken Klonopin for nerves off and on over the last year and would like some more of that. She works as a Copywriter, advertising and leaning forward makes her back spasms so she takes Flexeril intermittently. She says she's lost 50 pounds, hasn't had a good appetite.  Current allergies, medications, problem list, past/family and social histories reviewed.  Objective:  BP 110/62 mmHg  Pulse 79  Temp(Src) 99.1 F (37.3 C) (Oral)  Resp 17  Ht  (1.575 m)  Wt 125 lb (56.7 kg)  BMI 22.86 kg/m2  SpO2 100%  No major distress. TMs normal. Throat clear. Neck supple without nodes, bruit. Chest clear. Heart regular without murmur. Abdomen soft without masses or tenderness. Normal external genitalia. Vaginal mucosa unremarkable. Cervix benign. Pap taken. Bimanual exam reveals no adnexal or uterine masses. The uterus feels normal in size  Assessment & Plan:   Assessment: 1. Exposure to STD   2. Anxiety   3. Nausea without vomiting   4. Back muscle spasm   5. Pap smear, as part of routine gynecological examination   6. Other fatigue   7. Insomnia       Plan:   Orders Placed This Encounter  Procedures  . GC/Chlamydia Probe Amp    Order Specific Question:  Source    Answer:  urine  . POCT urinalysis dipstick  . POCT Microscopic Urinalysis ([facility])  . POCT  urine pregnancy    Meds ordered this encounter  Medications  . clonazePAM (KLONOPIN) 1 MG tablet    Sig: Use 1/2 to one maximum of twice daily, but avoid regular use.    Dispense:  30 tablet    Refill:  1    Refill in 14 days  . cyclobenzaprine (FLEXERIL) 5 MG tablet    Sig: Take 1 tablet (5 mg total) by mouth 3 (three) times daily as needed.    Dispense:  30 tablet    Refill:  2  . pantoprazole (PROTONIX) 40 MG tablet    Sig: Take 1 tablet (40 mg total) by mouth daily.    Dispense:  30 tablet    Refill:  1   Results for orders placed or performed in visit on 02/26/16  POCT urinalysis dipstick  Result Value Ref Range   Color, UA yellow yellow   Clarity, UA clear clear   Glucose, UA negative negative   Bilirubin, UA negative negative   Ketones, POC UA negative negative   Spec Grav, UA 1.010    Blood, UA negative negative   pH, UA 7.5    Protein Ur, POC negative negative   Urobilinogen, UA 0.2    Nitrite, UA Negative Negative   Leukocytes, UA Negative Negative  POCT Microscopic Urinalysis ([facility])  Result Value Ref Range   WBC,UR,HPF,POC None  None WBC/hpf   RBC,UR,HPF,POC None None RBC/hpf   Bacteria None None, Too numerous to count   Mucus Absent Absent   Epithelial Cells, UR Per Microscopy Few (A) None, Too numerous to count cells/hpf  POCT urine pregnancy  Result Value Ref Range   Preg Test, Ur Negative Negative         Patient Instructions   Use the clonazepam 1/2-1 pill maximum of twice daily, but recommend you try to minimize regular use  Recommend discontinuing use of marijuana  Drink more water regularly  Try to get daily exercise which will help you sleep  Use the muscle relaxant only when needed for back spasms, Flexeril (cyclobenzaprine)  I will let you know the results of your Pap and STD testing in a few days.  If you choose to be sexually involved, always use protection 100% of the time.  If NG level does not seem to be increasing or if new  problems develop please return  Take pantoprazole 40 mg 1 daily for stomach  Return as needed    IF you received an x-ray today, you will receive an invoice from [facility]. Please contact [facility] at [phone] with questions or concerns regarding your invoice.   IF you received labwork today, you will receive an invoice from [facility]. Please contact [facility] at [phone] with questions or concerns regarding your invoice.   Our billing staff will not be able to assist you with questions regarding bills from these companies.  You will be contacted with the lab results as soon as they are available. The fastest way to get your results is to activate your My Chart account. Instructions are located on the last page of this paperwork. If you have not heard from us regarding the results in 2 weeks, please contact this office.          Return if symptoms worsen or fail to improve.   Tyaira Heward, MD 02/26/2016

## 2016-02-27 LAB — PAP IG, CT-NG, RFX HPV ASCU
CHLAMYDIA PROBE AMP: NOT DETECTED
GC Probe Amp: NOT DETECTED

## 2016-02-27 LAB — GC/CHLAMYDIA PROBE AMP
CT PROBE, AMP APTIMA: NOT DETECTED
GC Probe RNA: NOT DETECTED

## 2016-05-01 ENCOUNTER — Encounter: Payer: Self-pay | Admitting: Physician Assistant

## 2016-05-01 ENCOUNTER — Ambulatory Visit (INDEPENDENT_AMBULATORY_CARE_PROVIDER_SITE_OTHER): Payer: BLUE CROSS/BLUE SHIELD | Admitting: Physician Assistant

## 2016-05-01 VITALS — BP 106/64 | HR 92 | Temp 98.2°F | Resp 16 | Ht 62.0 in | Wt 126.6 lb

## 2016-05-01 DIAGNOSIS — F411 Generalized anxiety disorder: Secondary | ICD-10-CM | POA: Diagnosis not present

## 2016-05-01 DIAGNOSIS — B9789 Other viral agents as the cause of diseases classified elsewhere: Principal | ICD-10-CM

## 2016-05-01 DIAGNOSIS — J069 Acute upper respiratory infection, unspecified: Secondary | ICD-10-CM | POA: Diagnosis not present

## 2016-05-01 MED ORDER — PROMETHAZINE-DM 6.25-15 MG/5ML PO SYRP: 5.0000 mL | ORAL_SOLUTION | Freq: Every evening | ORAL | Status: DC | PRN

## 2016-05-01 MED ORDER — BENZONATATE 100 MG PO CAPS: ORAL_CAPSULE | ORAL | Status: AC

## 2016-05-01 MED ORDER — IPRATROPIUM BROMIDE 0.06 % NA SOLN: 2.0000 | Freq: Three times a day (TID) | NASAL | Status: DC

## 2016-05-01 NOTE — Progress Notes (Signed)
Delfin EdisHae Chung  MRN: [id number] DOB: May 05, 1991  Subjective:  Pt presents to clinic with a week h/o cough with green sputum and nasal congestion with PND.  She thought it was allergies but she has been using daytime benadryl and it has not been helping her symptoms.  The cough is keeping her up at night.  She has no SOB or wheezing - she has started smoking due to stress and the fact that the klonopin makes her sleepy at work but that is where most of her stress and anxiety is coming from.  She has anxiety and was given klonopin at her last visit and it has helped - she has been able to sleep through the night - she has tried zoloft in the past and it made her suicidal and she really does not want to change to a daily medication when the klonopin is working well.  She has a stressful job.  She last got her depo in April and has not had a period since then - she has not been sexually active since before her last depo.  Patient Active Problem List   Diagnosis Date Noted  . Generalized anxiety disorder 08/08/2014  . Endometriosis 08/08/2014  . Bilateral ovarian cysts 08/08/2014    Current Outpatient Prescriptions on File Prior to Visit  Medication Sig Dispense Refill  . clonazePAM (KLONOPIN) 1 MG tablet Use 1/2 to one maximum of twice daily, but avoid regular use. 30 tablet 1  . cyclobenzaprine (FLEXERIL) 5 MG tablet Take 1 tablet (5 mg total) by mouth 3 (three) times daily as needed. 30 tablet 2   No current facility-administered medications on file prior to visit.    Allergies  Allergen Reactions  . Sertraline Hcl Other (See Comments)    Suicidality    Review of Systems  Constitutional: Positive for fever (subjective at night) and chills.  HENT: Positive for congestion, rhinorrhea (green) and sore throat.   Respiratory: Positive for cough. Negative for shortness of breath and wheezing.        No h/o asthma - occasional smoker  Musculoskeletal: Positive for myalgias.    Allergic/Immunologic: Positive for environmental allergies (using allergy medications).   Objective:  BP 106/64 mmHg  Pulse 92  Temp(Src) 98.2 F (36.8 C) (Oral)  Resp 16  Ht 5\' 2"  (1.575 m)  Wt 126 lb 9.6 oz (57.425 kg)  BMI 23.15 kg/m2  SpO2 100%  Physical Exam  Constitutional: She is oriented to person, place, and time and well-developed, well-nourished, and in no distress.  HENT:  Head: Normocephalic and atraumatic.  Right Ear: Hearing, tympanic membrane, external ear and ear canal normal.  Left Ear: Hearing, tympanic membrane, external ear and ear canal normal.  Nose: Mucosal edema (red) present.  Mouth/Throat: Uvula is midline, oropharynx is clear and moist and mucous membranes are normal.  Eyes: Conjunctivae are normal.  Neck: Normal range of motion.  Cardiovascular: Normal rate, regular rhythm and normal heart sounds.   No murmur heard. Pulmonary/Chest: Effort normal and breath sounds normal.  Neurological: She is alert and oriented to person, place, and time. Gait normal.  Skin: Skin is warm and dry.  Psychiatric: Mood, memory, affect and judgment normal.  Vitals reviewed.   Assessment and Plan :  Viral URI with cough - Plan: ipratropium (ATROVENT) 0.06 % nasal spray, benzonatate (TESSALON) 100 MG capsule, promethazine-dextromethorphan (PROMETHAZINE-DM) 6.25-15 MG/5ML syrup - symptomatic control - will not do a controlled substance containing cough medication at night because of her klonopin nighttime  use  Anxiety state - Plan: Ambulatory referral to Psychiatry, Care order/instruction - d/w pt that klonopin is not a good long term medication for anxiety and pt is really not interested in starting something today - she will accept a referral to psychiatry for treatment of her anxiety.  Benny LennertSarah Weber PA-C  [facility] 05/01/2016 10:02 AM

## 2016-05-01 NOTE — Patient Instructions (Addendum)
     IF you received an x-ray today, you will receive an invoice from [facility]. Please contact [facility] at [phone] with questions or concerns regarding your invoice.   IF you received labwork today, you will receive an invoice from [facility]. Please contact [facility] at [phone] with questions or concerns regarding your invoice.   Our billing staff will not be able to assist you with questions regarding bills from these companies.  You will be contacted with the lab results as soon as they are available. The fastest way to get your results is to activate your My Chart account. Instructions are located on the last page of this paperwork. If you have not heard from us regarding the results in 2 weeks, please contact this office.      

## 2016-05-16 ENCOUNTER — Ambulatory Visit (INDEPENDENT_AMBULATORY_CARE_PROVIDER_SITE_OTHER): Payer: BLUE CROSS/BLUE SHIELD | Admitting: Physician Assistant

## 2016-05-16 ENCOUNTER — Other Ambulatory Visit: Payer: Self-pay

## 2016-05-16 VITALS — BP 118/78 | HR 113 | Temp 98.4°F | Resp 18 | Ht 62.0 in | Wt 127.0 lb

## 2016-05-16 DIAGNOSIS — N644 Mastodynia: Secondary | ICD-10-CM

## 2016-05-16 DIAGNOSIS — N63 Unspecified lump in breast: Secondary | ICD-10-CM | POA: Diagnosis not present

## 2016-05-16 DIAGNOSIS — N6310 Unspecified lump in the right breast, unspecified quadrant: Secondary | ICD-10-CM

## 2016-05-16 MED ORDER — CEPHALEXIN 500 MG PO CAPS: 500.0000 mg | ORAL_CAPSULE | Freq: Four times a day (QID) | ORAL | Status: AC

## 2016-05-16 NOTE — Patient Instructions (Addendum)
  You will get a call about when your Ultrasound is - please take the antibiotic until you have this test - it is ok to use warm compresses    IF you received an x-ray today, you will receive an invoice from [facility]. Please contact [facility] at [phone] with questions or concerns regarding your invoice.   IF you received labwork today, you will receive an invoice from [facility]. Please contact [facility] at [phone] with questions or concerns regarding your invoice.   Our billing staff will not be able to assist you with questions regarding bills from these companies.  You will be contacted with the lab results as soon as they are available. The fastest way to get your results is to activate your My Chart account. Instructions are located on the last page of this paperwork. If you have not heard from us regarding the results in 2 weeks, please contact this office.

## 2016-05-16 NOTE — Progress Notes (Signed)
   Delfin EdisHae Chung  MRN: [id number] DOB: Feb 26, 1991  Subjective:  Pt presents to clinic with right lower breast pain since yesterday.  She has not had a trauma to the area.  LMP last year - she is on Depo.  No nipple discharge.  No family h/o breast cancer.  Review of Systems  Musculoskeletal: Positive for gait problem.    Patient Active Problem List   Diagnosis Date Noted  . Generalized anxiety disorder 08/08/2014  . Endometriosis 08/08/2014  . Bilateral ovarian cysts 08/08/2014    Current Outpatient Prescriptions on File Prior to Visit  Medication Sig Dispense Refill  . clonazePAM (KLONOPIN) 1 MG tablet Use 1/2 to one maximum of twice daily, but avoid regular use. 30 tablet 1  . cyclobenzaprine (FLEXERIL) 5 MG tablet Take 1 tablet (5 mg total) by mouth 3 (three) times daily as needed. 30 tablet 2   No current facility-administered medications on file prior to visit.    Allergies  Allergen Reactions  . Sertraline Hcl Other (See Comments)    Suicidality    Objective:  BP 118/78 mmHg  Pulse 113  Temp(Src) 98.4 F (36.9 C) (Oral)  Resp 18  Ht 5\' 2"  (1.575 m)  Wt 127 lb (57.607 kg)  BMI 23.22 kg/m2  SpO2 100%  Physical Exam  Constitutional: She is oriented to person, place, and time and well-developed, well-nourished, and in no distress.  HENT:  Head: Normocephalic and atraumatic.  Right Ear: Hearing and external ear normal.  Left Ear: Hearing and external ear normal.  Eyes: Conjunctivae are normal.  Neck: Normal range of motion.  Pulmonary/Chest: Effort normal. Right breast exhibits tenderness. Right breast exhibits no inverted nipple, no mass, no nipple discharge and no skin change. Left breast exhibits no inverted nipple, no mass, no nipple discharge, no skin change and no tenderness.    Neurological: She is alert and oriented to person, place, and time. Gait normal.  Skin: Skin is warm and dry.  Psychiatric: Mood, memory, affect and judgment normal.  Vitals  reviewed.   Assessment and Plan :  Lump of breast, right - Plan: US BREAST LTD UNI RIGHT INC AXILLA, cephALEXin (KEFLEX) 500 MG capsule, CANCELED: US BREAST COMPLETE UNI LEFT INC AXILLA  Breast pain in female - Plan: US BREAST LTD UNI RIGHT INC AXILLA, CANCELED: US BREAST COMPLETE UNI LEFT INC AXILLA  Benny LennertSarah Weber PA-C  [facility] 05/16/2016 9:47 AM

## 2016-06-24 ENCOUNTER — Encounter: Payer: Self-pay | Admitting: Physician Assistant

## 2016-06-24 ENCOUNTER — Ambulatory Visit (INDEPENDENT_AMBULATORY_CARE_PROVIDER_SITE_OTHER): Payer: BLUE CROSS/BLUE SHIELD | Admitting: Physician Assistant

## 2016-06-24 VITALS — BP 110/72 | HR 104 | Temp 100.4°F | Resp 17 | Ht 62.0 in | Wt 129.0 lb

## 2016-06-24 DIAGNOSIS — J020 Streptococcal pharyngitis: Secondary | ICD-10-CM | POA: Diagnosis not present

## 2016-06-24 DIAGNOSIS — R509 Fever, unspecified: Secondary | ICD-10-CM | POA: Diagnosis not present

## 2016-06-24 DIAGNOSIS — J111 Influenza due to unidentified influenza virus with other respiratory manifestations: Secondary | ICD-10-CM | POA: Diagnosis not present

## 2016-06-24 DIAGNOSIS — R69 Illness, unspecified: Principal | ICD-10-CM

## 2016-06-24 LAB — POCT RAPID STREP A (OFFICE): Rapid Strep A Screen: POSITIVE — AB

## 2016-06-24 LAB — POCT INFLUENZA A/B
Influenza A, POC: NEGATIVE
Influenza B, POC: NEGATIVE

## 2016-06-24 MED ORDER — CEFTRIAXONE SODIUM 1 G IJ SOLR
1.0000 g | Freq: Once | INTRAMUSCULAR | Status: AC
  Administered 2016-06-24: 1 g via INTRAMUSCULAR

## 2016-06-24 MED ORDER — AMOXICILLIN 875 MG PO TABS: 875.0000 mg | ORAL_TABLET | Freq: Two times a day (BID) | ORAL | 0 refills | Status: DC

## 2016-06-24 NOTE — Progress Notes (Signed)
06/24/2016 3:55 PM   DOB: 1990/11/09 / MRN: [id number]  SUBJECTIVE:  Kristie Thornton is a 25 y.o. ill appearing female presenting for myalgia, sore throat, HA that started yesterday.  She reports to me "I'm dying, and I feel like I have been hit by a truck." She associates mild cough and fever.  Denies nausea, emesis. She has tried "3-4 aleve today" and still feels bad.   She is allergic to sertraline hcl.   She  has a past medical history of Anxiety.    She  reports that she has been smoking.  She has quit using smokeless tobacco. She reports that she does not drink alcohol or use drugs. She  reports that she currently engages in sexual activity and has had female partners. She reports using the following method of birth control/protection: Pill. The patient  has no past surgical history on file.  Her family history includes ADD / ADHD in her brother; Cancer in her mother; Cancer (age of onset: 7775) in her father; Fibromyalgia in her sister.  Review of Systems  Constitutional: Positive for chills and fever.  Cardiovascular: Negative for chest pain.  Gastrointestinal: Negative for nausea.  Skin: Negative for itching and rash.  Neurological: Negative for dizziness and headaches.    The problem list and medications were reviewed and updated by myself where necessary and exist elsewhere in the encounter.   OBJECTIVE:  BP 110/72 (BP Location: Left Arm, Patient Position: Sitting, Cuff Size: Normal)   Pulse (!) 104   Temp (!) 100.4 F (38 C) (Oral)   Resp 17   Ht 5\' 2"  (1.575 m)   Wt 129 lb (58.5 kg)   SpO2 100%   BMI 23.59 kg/m   Physical Exam  Constitutional: She is oriented to person, place, and time. She appears well-developed and well-nourished. She appears ill. No distress.  HENT:  Mouth/Throat: No oropharyngeal exudate.  Cardiovascular: Normal rate and regular rhythm.   Pulmonary/Chest: Effort normal and breath sounds normal.  Abdominal: Soft. Bowel sounds are normal.    Musculoskeletal: Normal range of motion.  Neurological: She is alert and oriented to person, place, and time. She displays normal reflexes. No cranial nerve deficit. Coordination normal.  Skin: Skin is warm and dry. She is not diaphoretic.  Psychiatric: She has a normal mood and affect.    Results for orders placed or performed in visit on 06/24/16 (from the past 72 hour(s))  POCT rapid strep A     Status: Abnormal   Collection Time: 06/24/16  2:06 PM  Result Value Ref Range   Rapid Strep A Screen Positive (A) Negative  POCT Influenza A/B     Status: None   Collection Time: 06/24/16  2:23 PM  Result Value Ref Range   Influenza A, POC Negative Negative   Influenza B, POC Negative Negative    No results found.  ASSESSMENT AND PLAN  Kristie Thornton was seen today for chills, sore throat, cough, generalized body aches and medication refill.  Diagnoses and all orders for this visit:  Influenza-like illness -     POCT Influenza A/B -     POCT rapid strep A -     Cancel: Culture, Group A Strep  Streptococcal sore throat -     cefTRIAXone (ROCEPHIN) injection 1 g; Inject 1 g into the muscle once. -     amoxicillin (AMOXIL) 875 MG tablet; Take 1 tablet (875 mg total) by mouth 2 (two) times daily.    The patient is  advised to call or return to clinic if she does not see an improvement in symptoms, or to seek the care of the closest emergency department if she worsens with the above plan.   Deliah Boston, MHS, PA-C [facility] 06/24/2016 3:55 PM

## 2016-06-24 NOTE — Patient Instructions (Addendum)
Take 2 Aleve in the morning and one at night. Also take 1 gram of Tylenol every 8hrs as needed for pain.    IF you received an x-ray today, you will receive an invoice from [facility]. Please contact [facility] at [phone] with questions or concerns regarding your invoice.   IF you received labwork today, you will receive an invoice from [facility]. Please contact [facility] at [phone] with questions or concerns regarding your invoice.   Our billing staff will not be able to assist you with questions regarding bills from these companies.  You will be contacted with the lab results as soon as they are available. The fastest way to get your results is to activate your My Chart account. Instructions are located on the last page of this paperwork. If you have not heard from us regarding the results in 2 weeks, please contact this office.

## 2016-07-03 ENCOUNTER — Ambulatory Visit (INDEPENDENT_AMBULATORY_CARE_PROVIDER_SITE_OTHER): Payer: BLUE CROSS/BLUE SHIELD | Admitting: Physician Assistant

## 2016-07-03 ENCOUNTER — Encounter: Payer: Self-pay | Admitting: Physician Assistant

## 2016-07-03 ENCOUNTER — Telehealth: Payer: Self-pay | Admitting: Physician Assistant

## 2016-07-03 VITALS — BP 92/64 | HR 94 | Temp 98.8°F | Resp 18 | Ht 62.0 in | Wt 130.0 lb

## 2016-07-03 DIAGNOSIS — J069 Acute upper respiratory infection, unspecified: Secondary | ICD-10-CM

## 2016-07-03 DIAGNOSIS — B9789 Other viral agents as the cause of diseases classified elsewhere: Principal | ICD-10-CM

## 2016-07-03 MED ORDER — HYDROCODONE-HOMATROPINE 5-1.5 MG/5ML PO SYRP: 2.5000 mL | ORAL_SOLUTION | Freq: Every day | ORAL | 0 refills | Status: AC

## 2016-07-03 NOTE — Progress Notes (Signed)
07/03/2016 2:13 PM   DOB: 1991-06-22 / MRN: [id number]  SUBJECTIVE:  Kristie Thornton is a 25 y.o. female presenting for cough, sore throat, nasal congestion, all that started 3 days ago.  I recently treated her for strep throat and she reports this has resolved.  She has three days of amoxicillin left and has been taking this.  Denies fever, chills, nausea, SOB, DOE, chest pain. She took an at home pregnancy test today and was negative.   She is allergic to sertraline hcl.   She  has a past medical history of Anxiety.    She  reports that she has been smoking.  She has quit using smokeless tobacco. She reports that she does not drink alcohol or use drugs. She  reports that she currently engages in sexual activity and has had female partners. She reports using the following method of birth control/protection: Pill. The patient  has no past surgical history on file.  Her family history includes ADD / ADHD in her brother; Cancer in her mother; Cancer (age of onset: 7875) in her father; Fibromyalgia in her sister.  Review of Systems  Constitutional: Positive for malaise/fatigue. Negative for chills, diaphoresis and fever.  HENT: Positive for congestion and sore throat.   Respiratory: Positive for cough. Negative for hemoptysis, shortness of breath and wheezing.   Cardiovascular: Negative for chest pain.  Gastrointestinal: Negative for nausea.  Skin: Negative for rash.  Neurological: Negative for dizziness and weakness.  Endo/Heme/Allergies: Negative for polydipsia.    The problem list and medications were reviewed and updated by myself where necessary and exist elsewhere in the encounter.   OBJECTIVE:  BP 92/64 (BP Location: Right Arm, Patient Position: Sitting, Cuff Size: Small)   Pulse 94   Temp 98.8 F (37.1 C) (Oral)   Resp 18   Ht 5\' 2"  (1.575 m)   Wt 130 lb (59 kg)   SpO2 100%   BMI 23.78 kg/m   Physical Exam  Constitutional: She is oriented to person, place, and time.  HENT:    Right Ear: Tympanic membrane and external ear normal.  Left Ear: Tympanic membrane and external ear normal.  Nose: Mucosal edema present. Right sinus exhibits no maxillary sinus tenderness and no frontal sinus tenderness. Left sinus exhibits no maxillary sinus tenderness and no frontal sinus tenderness.  Mouth/Throat: Uvula is midline, oropharynx is clear and moist and mucous membranes are normal. No oropharyngeal exudate.  Eyes: Conjunctivae are normal. Pupils are equal, round, and reactive to light.  Cardiovascular: Regular rhythm and normal heart sounds.   Pulmonary/Chest: Effort normal and breath sounds normal. She has no rales.  Neurological: She is alert and oriented to person, place, and time.  Skin: Skin is warm and dry. No rash noted. She is not diaphoretic. No erythema.  Psychiatric: Her behavior is normal.    No results found for this or any previous visit (from the past 72 hour(s)).  No results found.  ASSESSMENT AND PLAN  Kristie Thornton was seen today for follow-up.  Diagnoses and all orders for this visit:  Viral URI with cough: Vitals normal. Advised she rest and drink fluids.  She took a pregnancy test today at home and tested negative.  See AVS for OTC advise.  -     HYDROcodone-homatropine (HYCODAN) 5-1.5 MG/5ML syrup; Take 2.5-5 mLs by mouth at bedtime.    The patient is advised to call or return to clinic if she does not see an improvement in symptoms, or to seek the  care of the closest emergency department if she worsens with the above plan.   Deliah Boston, MHS, PA-C [facility] 07/03/2016 2:13 PM

## 2016-07-03 NOTE — Progress Notes (Signed)
   07/03/2016 1:54 PM   DOB: [birth year] / MRN: [id number]  SUBJECTIVE:  Kristie Thornton is a 25 y.o. female presenting for   She is allergic to sertraline hcl.   She  has a past medical history of Anxiety.    She  reports that she has been smoking.  She has quit using smokeless tobacco. She reports that she does not drink alcohol or use drugs. She  reports that she currently engages in sexual activity and has had female partners. She reports using the following method of birth control/protection: Pill. The patient  has no past surgical history on file.  Her family history includes ADD / ADHD in her brother; Cancer in her mother; Cancer (age of onset: 10975) in her father; Fibromyalgia in her sister.  ROS  The problem list and medications were reviewed and updated by myself where necessary and exist elsewhere in the encounter.   OBJECTIVE:  BP 92/64 (BP Location: Right Arm, Patient Position: Sitting, Cuff Size: Small)   Pulse 94   Temp 98.8 F (37.1 C) (Oral)   Resp 18   Ht 5\' 2"  (1.575 m)   Wt 130 lb (59 kg)   SpO2 100%   BMI 23.78 kg/m   Temp Readings from Last 3 Encounters:  07/03/16 98.8 F (37.1 C) (Oral)  06/24/16 (!) 100.4 F (38 C) (Oral)  05/16/16 98.4 F (36.9 C) (Oral)   Pulse Readings from Last 3 Encounters:  07/03/16 94  06/24/16 (!) 104  05/16/16 (!) 113      Physical Exam  No results found for this or any previous visit (from the past 72 hour(s)).  No results found.  ASSESSMENT AND PLAN  There are no diagnoses linked to this encounter.  The patient is advised to call or return to clinic if she does not see an improvement in symptoms, or to seek the care of the closest emergency department if she worsens with the above plan.   Deliah BostonMichael Rashana Andrew, MHS, PA-C [facility] 07/03/2016 1:54 PM

## 2016-07-03 NOTE — Patient Instructions (Addendum)
 -   I also recommend that you take Zyrtec-D 5-120 every morning or Claritin-D 10-240 for the next five to 10 days. This medication will help you with nasal congestion, post nasal drip and sneezing. You will have to purchase this directly from the pharmacist   Viral URI Medications: Please consult the pharmacist if you have questions. 600-800 mg ibuprofen every 8 hours as needed for the next five to ten days 5-120 Zyrtec-D every morning for five to 10 days OR Claritin D 10-240 every morning for five days.  Please visit the CDC's website http://www.davies-cooke.net/https://www.cdc.gov/getsmart/ to learn about appropriate and inappropriate uses of antibiotics.       IF you received an x-ray today, you will receive an invoice from [facility]. Please contact [facility] at [phone] with questions or concerns regarding your invoice.   IF you received labwork today, you will receive an invoice from [facility]. Please contact [facility] at [phone] with questions or concerns regarding your invoice.   Our billing staff will not be able to assist you with questions regarding bills from these companies.  You will be contacted with the lab results as soon as they are available. The fastest way to get your results is to activate your My Chart account. Instructions are located on the last page of this paperwork. If you have not heard from us regarding the results in 2 weeks, please contact this office.

## 2016-07-03 NOTE — Telephone Encounter (Signed)
Pt is needing to talk with Kristie Thornton about her medication she did not want to go into further detail   Best number [phone]

## 2016-07-04 NOTE — Telephone Encounter (Signed)
I just tried to call back.  LMOM to call us back. Deliah BostonMichael Khai Torbert, MS, PA-C 2:03 PM, 07/04/2016

## 2016-10-01 ENCOUNTER — Ambulatory Visit (INDEPENDENT_AMBULATORY_CARE_PROVIDER_SITE_OTHER): Payer: BLUE CROSS/BLUE SHIELD | Admitting: Urgent Care

## 2016-10-01 VITALS — BP 112/72 | HR 98 | Temp 98.4°F | Resp 17 | Ht 62.0 in | Wt 128.0 lb

## 2016-10-01 DIAGNOSIS — N6310 Unspecified lump in the right breast, unspecified quadrant: Secondary | ICD-10-CM | POA: Diagnosis not present

## 2016-10-01 DIAGNOSIS — R0789 Other chest pain: Secondary | ICD-10-CM | POA: Diagnosis not present

## 2016-10-01 DIAGNOSIS — F17200 Nicotine dependence, unspecified, uncomplicated: Secondary | ICD-10-CM

## 2016-10-01 DIAGNOSIS — R05 Cough: Secondary | ICD-10-CM

## 2016-10-01 DIAGNOSIS — R059 Cough, unspecified: Secondary | ICD-10-CM

## 2016-10-01 MED ORDER — HYDROCODONE-HOMATROPINE 5-1.5 MG/5ML PO SYRP: 5.0000 mL | ORAL_SOLUTION | Freq: Every evening | ORAL | 0 refills | Status: DC | PRN

## 2016-10-01 MED ORDER — BENZONATATE 100 MG PO CAPS: 100.0000 mg | ORAL_CAPSULE | Freq: Three times a day (TID) | ORAL | 0 refills | Status: DC | PRN

## 2016-10-01 MED ORDER — AZITHROMYCIN 250 MG PO TABS: ORAL_TABLET | ORAL | 0 refills | Status: DC

## 2016-10-01 NOTE — Progress Notes (Signed)
    MRN: [id number] DOB: 17-Mar-1991  Subjective:   Kristie Thornton is a 25 y.o. female presenting for chief complaint of Cough (PT states fever earlier. Also, pt would like to discuss old referral to mammo. )  Mammogram - Reports several month history of right breast mass. Denies unexplained fever, nipple discharge, bleeding, skin changes, nipple inversion. Patient was supposed to get a mammogram but never followed up for that.   Cough - Reports 2 week history of worsening productive cough, subjective fever. Cough is causing shob, chest pain. Also has sore throat, nausea, vomiting x1, upset stomach. Has not tried any medications. Smokes  1-3 cigarettes daily. Cut back from 1 pack per day.  Kristie Thornton currently has no medications in their medication list. Also is allergic to sertraline hcl.  Kristie Thornton  has a past medical history of Anxiety. Also  has no past surgical history on file.   Objective:   Vitals: BP 112/72 (BP Location: Right Arm, Patient Position: Sitting, Cuff Size: Normal)   Pulse 98   Temp 98.4 F (36.9 C) (Oral)   Resp 17   Ht 5\' 2"  (1.575 m)   Wt 128 lb (58.1 kg)   LMP 09/19/2016 (Approximate)   SpO2 98%   BMI 23.41 kg/m   Physical Exam  Constitutional: She is oriented to person, place, and time. She appears well-developed and well-nourished.  HENT:  Mouth/Throat: Oropharynx is clear and moist. No oropharyngeal exudate.  Nasal turbinates pink and moist.   Eyes: Right eye exhibits no discharge. Left eye exhibits no discharge.  Cardiovascular: Normal rate, regular rhythm and intact distal pulses.  Exam reveals no gallop and no friction rub.   No murmur heard. Pulmonary/Chest: No respiratory distress. She has no wheezes. She has no rales.    Rhonchi bilaterally in mid-lower lung fields.  Neurological: She is alert and oriented to person, place, and time.   Assessment and Plan :   1. Breast mass, right - I [facility]. I counseled her  on what I suspect could be fibrocystic changes, fibroadenoma. She verbalized understanding and will try to schedule her appointment using my standing order in Epic.  2. Cough 3. Atypical chest pain - Will cover for bronchitis, lower respiratory infection with azithromycin. Supportive care otherwise, rtc in 5-6 days if no improvement.  4. Tobacco use disorder - Encouraged her smoking cessation  Patient will schedule follow up appointment to discuss insomnia.  Wallis BambergMario Nemesio Castrillon, PA-C [facility] [phone] 10/01/2016 4:52 PM

## 2016-10-01 NOTE — Patient Instructions (Addendum)
Cough, Adult Coughing is a reflex that clears your throat and your airways. Coughing helps to heal and protect your lungs. It is normal to cough occasionally, but a cough that happens with other symptoms or lasts a long time may be a sign of a condition that needs treatment. A cough may last only 2-3 weeks (acute), or it may last longer than 8 weeks (chronic). What are the causes? Coughing is commonly caused by:  Breathing in substances that irritate your lungs.  A viral or bacterial respiratory infection.  Allergies.  Asthma.  Postnasal drip.  Smoking.  Acid backing up from the stomach into the esophagus (gastroesophageal reflux).  Certain medicines.  Chronic lung problems, including COPD (or rarely, lung cancer).  Other medical conditions such as heart failure. Follow these instructions at home: Pay attention to any changes in your symptoms. Take these actions to help with your discomfort:  Take medicines only as told by your health care provider.  If you were prescribed an antibiotic medicine, take it as told by your health care provider. Do not stop taking the antibiotic even if you start to feel better.  Talk with your health care provider before you take a cough suppressant medicine.  Drink enough fluid to keep your urine clear or pale yellow.  If the air is dry, use a cold steam vaporizer or humidifier in your bedroom or your home to help loosen secretions.  Avoid anything that causes you to cough at work or at home.  If your cough is worse at night, try sleeping in a semi-upright position.  Avoid cigarette smoke. If you smoke, quit smoking. If you need help quitting, ask your health care provider.  Avoid caffeine.  Avoid alcohol.  Rest as needed. Contact a health care provider if:  You have new symptoms.  You cough up pus.  Your cough does not get better after 2-3 weeks, or your cough gets worse.  You cannot control your cough with suppressant medicines  and you are losing sleep.  You develop pain that is getting worse or pain that is not controlled with pain medicines.  You have a fever.  You have unexplained weight loss.  You have night sweats. Get help right away if:  You cough up blood.  You have difficulty breathing.  Your heartbeat is very fast. This information is not intended to replace advice given to you by your health care provider. Make sure you discuss any questions you have with your health care provider. Document Released: 04/12/2011 Document Revised: 03/21/2016 Document Reviewed: 12/21/2014 Elsevier Interactive Patient Education  2017 Elsevier Inc.     Breast Tenderness Breast tenderness is a common problem for women of all ages. Breast tenderness may cause mild discomfort to severe pain. The pain usually comes and goes in association with your menstrual cycle, but it can be constant. Breast tenderness has many possible causes, including hormone changes and some medicines. Your health care provider may order tests, such as a mammogram or an ultrasound, to check for any unusual findings. Having breast tenderness usually does not mean that you have breast cancer. Follow these instructions at home: Sometimes, reassurance that you do not have breast cancer is all that is needed. In general, follow these home care instructions: Managing pain and discomfort  If directed, apply ice to the area:  Put ice in a plastic bag.  Place a towel between your skin and the bag.  Leave the ice on for 20 minutes, 2-3 times a day.  Make sure you are wearing a supportive bra, especially during exercise. You may also want to wear a supportive bra while sleeping if your breasts are very tender. Medicines  Take over-the-counter and prescription medicines only as told by your health care provider. If the cause of your pain is infection, you may be prescribed an antibiotic medicine.  If you were prescribed an antibiotic, take it as  told by your health care provider. Do not stop taking the antibiotic even if you start to feel better. General instructions  Your health care provider may recommend that you reduce the amount of fat in your diet. You can do this by:  Limiting fried foods.  Cooking foods using methods, such as baking, boiling, grilling, and broiling.  Decrease the amount of caffeine in your diet. You can do this by drinking more water and choosing caffeine-free options.  Keep a log of the days and times when your breasts are most tender.  Ask your health care provider how to do breast exams at home. This will help you notice if you have an unusual growth or lump. Contact a health care provider if:  Any part of your breast is hard, red, and hot to the touch. This may be a sign of infection.  You are not breastfeeding and you have fluid, especially blood or pus, coming out of your nipples.  You have a fever.  You have a new or painful lump in your breast that remains after your menstrual period ends.  Your pain does not improve or it gets worse.  Your pain is interfering with your daily activities. This information is not intended to replace advice given to you by your health care provider. Make sure you discuss any questions you have with your health care provider. Document Released: 09/26/2008 Document Revised: 07/12/2016 Document Reviewed: 07/12/2016 Elsevier Interactive Patient Education  2017 [employer]     IF you received an x-ray today, you will receive an invoice from [facility]. Please contact [facility] at [phone] with questions or concerns regarding your invoice.   IF you received labwork today, you will receive an invoice from [facility]. Please contact [facility] at [phone] with questions or concerns regarding your invoice.   Our billing staff will not be able to assist you with questions regarding bills from these  companies.  You will be contacted with the lab results as soon as they are available. The fastest way to get your results is to activate your My Chart account. Instructions are located on the last page of this paperwork. If you have not heard from us regarding the results in 2 weeks, please contact this office.

## 2016-10-08 ENCOUNTER — Other Ambulatory Visit: Payer: Self-pay

## 2016-10-23 ENCOUNTER — Ambulatory Visit
Admission: RE | Admit: 2016-10-23 | Discharge: 2016-10-23 | Disposition: A | Discharge status: Home / Self Care | Payer: Self-pay | Source: Ambulatory Visit | Attending: Urgent Care | Admitting: Urgent Care

## 2016-10-23 ENCOUNTER — Other Ambulatory Visit: Payer: Self-pay | Admitting: Urgent Care

## 2016-10-23 DIAGNOSIS — N6310 Unspecified lump in the right breast, unspecified quadrant: Secondary | ICD-10-CM

## 2016-10-28 NOTE — L&D Delivery Note (Signed)
Patient is 26 y.o. G1P0 2056w2d admitted for IOL for postdates.   Delivery Note At 0758 a viable female was delivered via  vacuum assisted vaginal delivery, Presentation: cephalic,LOA. APGAR: 7,9 ; weight pending.   Placenta status: manual removal due to cord avulsion, intact. Cord: 3 vessel  Applied kiwi vacuum to fetal head, careful to avoid maternal tissue. Patient pushed with good maternal effort to deliver fetal head after 2 contractions. Shoulders were difficult to deliver but not dystocia. Shoulders were more transverse position and turning the baby delivered posterior shoulder successfully. Anterior shoulder and rest of body followed without issue.  Anesthesia:  peidural Episiotomy:  none Lacerations:  2nd deg Suture Repair: 2-0 vycril Est. Blood Loss (mL): 300  Mom to postpartum.  Baby to Couplet care / Skin to Skin.  Rolm BookbinderAmber Narek Kniss, DO [place]

## 2016-10-30 ENCOUNTER — Ambulatory Visit (INDEPENDENT_AMBULATORY_CARE_PROVIDER_SITE_OTHER): Payer: BLUE CROSS/BLUE SHIELD | Admitting: Urgent Care

## 2016-10-30 ENCOUNTER — Ambulatory Visit (INDEPENDENT_AMBULATORY_CARE_PROVIDER_SITE_OTHER): Payer: BLUE CROSS/BLUE SHIELD

## 2016-10-30 VITALS — BP 120/84 | HR 92 | Temp 99.2°F | Resp 16 | Ht 62.0 in | Wt 122.0 lb

## 2016-10-30 DIAGNOSIS — G4700 Insomnia, unspecified: Secondary | ICD-10-CM | POA: Diagnosis not present

## 2016-10-30 DIAGNOSIS — F17200 Nicotine dependence, unspecified, uncomplicated: Secondary | ICD-10-CM | POA: Diagnosis not present

## 2016-10-30 DIAGNOSIS — J069 Acute upper respiratory infection, unspecified: Secondary | ICD-10-CM

## 2016-10-30 DIAGNOSIS — R05 Cough: Secondary | ICD-10-CM

## 2016-10-30 DIAGNOSIS — F4310 Post-traumatic stress disorder, unspecified: Secondary | ICD-10-CM | POA: Diagnosis not present

## 2016-10-30 DIAGNOSIS — B9789 Other viral agents as the cause of diseases classified elsewhere: Secondary | ICD-10-CM

## 2016-10-30 DIAGNOSIS — F419 Anxiety disorder, unspecified: Secondary | ICD-10-CM

## 2016-10-30 MED ORDER — HYDROXYZINE HCL 50 MG PO TABS: 50.0000 mg | ORAL_TABLET | Freq: Every evening | ORAL | 5 refills | Status: DC | PRN

## 2016-10-30 MED ORDER — BENZONATATE 100 MG PO CAPS: 100.0000 mg | ORAL_CAPSULE | Freq: Three times a day (TID) | ORAL | 0 refills | Status: DC | PRN

## 2016-10-30 MED ORDER — CETIRIZINE HCL 10 MG PO TABS: 10.0000 mg | ORAL_TABLET | Freq: Every day | ORAL | 11 refills | Status: DC

## 2016-10-30 MED ORDER — CYCLOBENZAPRINE HCL 10 MG PO TABS: 10.0000 mg | ORAL_TABLET | Freq: Every evening | ORAL | 5 refills | Status: DC | PRN

## 2016-10-30 MED ORDER — PSEUDOEPHEDRINE HCL ER 120 MG PO TB12: 120.0000 mg | ORAL_TABLET | Freq: Two times a day (BID) | ORAL | 3 refills | Status: DC

## 2016-10-30 NOTE — Patient Instructions (Addendum)
[facility] [address] [place], [place] [address]   Kristie Thornton [phone]  Kristie Thornton [phone]  Kristie Thornton [phone]  DO NOT TAKE VISTARIL AND FLEXERIL TOGETHER FOR INSOMNIA. Please let me know if you change your mind about starting SSRI therapy.    Cough, Adult Coughing is a reflex that clears your throat and your airways. Coughing helps to heal and protect your lungs. It is normal to cough occasionally, but a cough that happens with other symptoms or lasts a long time may be a sign of a condition that needs treatment. A cough may last only 2-3 weeks (acute), or it may last longer than 8 weeks (chronic). What are the causes? Coughing is commonly caused by:  Breathing in substances that irritate your lungs.  A viral or bacterial respiratory infection.  Allergies.  Asthma.  Postnasal drip.  Smoking.  Acid backing up from the stomach into the esophagus (gastroesophageal reflux).  Certain medicines.  Chronic lung problems, including COPD (or rarely, lung cancer).  Other medical conditions such as heart failure. Follow these instructions at home: Pay attention to any changes in your symptoms. Take these actions to help with your discomfort:  Take medicines only as told by your health care provider.  If you were prescribed an antibiotic medicine, take it as told by your health care provider. Do not stop taking the antibiotic even if you start to feel better.  Talk with your health care provider before you take a cough suppressant medicine.  Drink enough fluid to keep your urine clear or pale yellow.  If the air is dry, use a cold steam vaporizer or humidifier in your bedroom or your home to help loosen secretions.  Avoid anything that causes you to cough at work or at home.  If your cough is worse at night, try sleeping in a semi-upright position.  Avoid cigarette smoke. If you smoke, quit smoking. If you need help  quitting, ask your health care provider.  Avoid caffeine.  Avoid alcohol.  Rest as needed. Contact a health care provider if:  You have new symptoms.  You cough up pus.  Your cough does not get better after 2-3 weeks, or your cough gets worse.  You cannot control your cough with suppressant medicines and you are losing sleep.  You develop pain that is getting worse or pain that is not controlled with pain medicines.  You have a fever.  You have unexplained weight loss.  You have night sweats. Get help right away if:  You cough up blood.  You have difficulty breathing.  Your heartbeat is very fast. This information is not intended to replace advice given to you by your health care provider. Make sure you discuss any questions you have with your health care provider. Document Released: 04/12/2011 Document Revised: 03/21/2016 Document Reviewed: 12/21/2014 Elsevier Interactive Patient Education  2017 Elsevier Inc.    Smoking Hazards Smoking cigarettes is extremely bad for your health. Tobacco smoke has over 200 known poisons in it. It contains the poisonous gases nitrogen oxide and carbon monoxide. There are over 60 chemicals in tobacco smoke that cause cancer. Some of the chemicals found in cigarette smoke include:   Cyanide.   Benzene.   Formaldehyde.   Methanol (wood alcohol).   Acetylene (fuel used in welding torches).   Ammonia.  Even smoking lightly shortens your life expectancy by several years. You can greatly reduce the risk of medical problems for you and your family by stopping now. Smoking  is the most preventable cause of death and disease in our society. Within days of quitting smoking, your circulation improves, you decrease the risk of having a heart attack, and your lung capacity improves. There may be some increased phlegm in the first few days after quitting, and it may take months for your lungs to clear up completely. Quitting for 10 years  reduces your risk of developing lung cancer to almost that of a nonsmoker.  WHAT ARE THE RISKS OF SMOKING? Cigarette smokers have an increased risk of many serious medical problems, including:  Lung cancer.   Lung disease (such as pneumonia, bronchitis, and emphysema).   Heart attack and chest pain due to the heart not getting enough oxygen (angina).   Heart disease and peripheral blood vessel disease.   Hypertension.   Stroke.   Oral cancer (cancer of the lip, mouth, or voice box).   Bladder cancer.   Pancreatic cancer.   Cervical cancer.   Pregnancy complications, including premature birth.   Stillbirths and smaller newborn babies, birth defects, and genetic damage to sperm.   Early menopause.   Lower estrogen level for women.   Infertility.   Facial wrinkles.   Blindness.   Increased risk of broken bones (fractures).   Senile dementia.   Stomach ulcers and internal bleeding.   Delayed wound healing and increased risk of complications during surgery. Because of secondhand smoke exposure, children of smokers have an increased risk of the following:   Sudden infant death syndrome (SIDS).   Respiratory infections.   Lung cancer.   Heart disease.   Ear infections.  WHY IS SMOKING ADDICTIVE? Nicotine is the chemical agent in tobacco that is capable of causing addiction or dependence. When you smoke and inhale, nicotine is absorbed rapidly into the bloodstream through your lungs. Both inhaled and noninhaled nicotine may be addictive.  WHAT ARE THE BENEFITS OF QUITTING?  There are many health benefits to quitting smoking. Some are:   The likelihood of developing cancer and heart disease decreases. Health improvements are seen almost immediately.   Blood pressure, pulse rate, and breathing patterns start returning to normal soon after quitting.   People who quit may see an improvement in their overall quality of life.  HOW DO YOU  QUIT SMOKING? Smoking is an addiction with both physical and psychological effects, and longtime habits can be hard to change. Your health care provider can recommend:  Programs and community resources, which may include group support, education, or therapy.  Replacement products, such as patches, gum, and nasal sprays. Use these products only as directed. Do not replace cigarette smoking with electronic cigarettes (commonly called e-cigarettes). The safety of e-cigarettes is unknown, and some may contain harmful chemicals. FOR MORE INFORMATION  American Lung Association: www.lung.org  American Cancer Society: www.cancer.org This information is not intended to replace advice given to you by your health care provider. Make sure you discuss any questions you have with your health care provider. Document Released: 11/21/2004 Document Revised: 02/05/2016 Document Reviewed: 04/05/2013 Elsevier Interactive Patient Education  2017 [employer].   IF you received an x-ray today, you will receive an invoice from [facility]. Please contact [facility] at [phone] with questions or concerns regarding your invoice.   IF you received labwork today, you will receive an invoice from [facility]. Please contact [facility] at [phone] with questions or concerns regarding your invoice.   Our billing staff will not be able to assist you with questions regarding bills from these companies.  You will be contacted with the lab results as soon as they are available. The fastest way to get your results is to activate your My Chart account. Instructions are located on the last page of this paperwork. If you have not heard from [place] regarding the results in 2 weeks, please contact this office.

## 2016-10-30 NOTE — Progress Notes (Signed)
I originally asked to this patient for Gurney MaxinMike Mani as this patient had specifically requested to see him but agreed to see another provider as she was running late for another appointment. I entered the room to see the patient and she immediately begin to list the following medications that she wanted: Klonopin, cyclobenzaprine, hycodan cough syrup, benzonatate , and a Zpack. I explained to her that I will not simply write prescriptions, and according to her CC, she was requesting to be seen for URI and cough. She precedes to tell me that she had pneumonia and feels like its back. Also she has a police report showing that was arrested for narcotics possession due to her "klonopin pills" were in different bottles. She had very pressured and rushed speech. Advised her I would evaluate her for cough and URI symptoms only and she could follow-up with Gurney MaxinMike Mani regarding other medications as I did not fee comfortable giving her controlled or potentially sedating type medications. She became upset although not verbally aggressive, she agreed to end our visit and reschedule with Gurney MaxinMike Mani. No charge for her time with me.   Godfrey PickKimberly S. Tiburcio PeaHarris, MSN, FNP-C Primary Care at [facility] [phone]
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
## 2016-11-20 ENCOUNTER — Ambulatory Visit (INDEPENDENT_AMBULATORY_CARE_PROVIDER_SITE_OTHER): Payer: BLUE CROSS/BLUE SHIELD | Admitting: Urgent Care

## 2016-11-20 VITALS — BP 128/80 | HR 113 | Temp 98.2°F | Resp 17 | Ht 62.0 in | Wt 119.0 lb

## 2016-11-20 DIAGNOSIS — R21 Rash and other nonspecific skin eruption: Secondary | ICD-10-CM

## 2016-11-20 DIAGNOSIS — Z7251 High risk heterosexual behavior: Secondary | ICD-10-CM | POA: Diagnosis not present

## 2016-11-20 LAB — POCT SKIN KOH: SKIN KOH, POC: NEGATIVE

## 2016-11-20 MED ORDER — KETOCONAZOLE 2 % EX CREA: 1.0000 "application " | TOPICAL_CREAM | Freq: Every day | CUTANEOUS | 0 refills | Status: DC
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
## 2016-11-20 NOTE — Patient Instructions (Addendum)
I believe you have a fungal skin infection. Make sure you apply ketoconazole cream once daily for 2 weeks. If this does not make the rash go away then you may need to have a biopsy and more blood work done. Please come back for a recheck if this is the case. Thank you for letting me participate in your health and well being.     IF you received an x-ray today, you will receive an invoice from [facility]. Please contact [facility] at [phone] with questions or concerns regarding your invoice.   IF you received labwork today, you will receive an invoice from [facility]. Please contact [facility] at [phone] with questions or concerns regarding your invoice.   Our billing staff will not be able to assist you with questions regarding bills from these companies.  You will be contacted with the lab results as soon as they are available. The fastest way to get your results is to activate your My Chart account. Instructions are located on the last page of this paperwork. If you have not heard from us regarding the results in 2 weeks, please contact this office.

## 2016-11-21 LAB — HEPATITIS C ANTIBODY

## 2016-11-21 LAB — HEPATITIS B SURFACE ANTIGEN: Hepatitis B Surface Ag: NEGATIVE

## 2016-11-21 LAB — HEPATITIS B SURFACE ANTIBODY, QUANTITATIVE: Hepatitis B Surf Ab Quant: <3.1 m[IU]/mL — ABNORMAL LOW (ref >9.9)

## 2016-11-23 LAB — GC/CHLAMYDIA PROBE AMP
Chlamydia trachomatis, NAA: NEGATIVE
Neisseria gonorrhoeae by PCR: NEGATIVE

## 2016-11-23 LAB — TRICHOMONAS VAGINALIS, PROBE AMP: Trich vag by NAA: NEGATIVE

## 2017-01-21 ENCOUNTER — Ambulatory Visit (INDEPENDENT_AMBULATORY_CARE_PROVIDER_SITE_OTHER): Payer: BLUE CROSS/BLUE SHIELD | Admitting: Physician Assistant

## 2017-01-21 VITALS — BP 119/77 | HR 117 | Temp 98.6°F | Resp 16 | Ht 62.0 in | Wt 124.0 lb

## 2017-01-21 DIAGNOSIS — Z3A01 Less than 8 weeks gestation of pregnancy: Secondary | ICD-10-CM | POA: Diagnosis not present

## 2017-01-21 DIAGNOSIS — N926 Irregular menstruation, unspecified: Secondary | ICD-10-CM | POA: Diagnosis not present

## 2017-01-21 LAB — POCT URINE PREGNANCY: PREG TEST UR: POSITIVE — AB

## 2017-01-21 NOTE — Patient Instructions (Addendum)
First Trimester of Pregnancy The first trimester of pregnancy is from week 1 until the end of week 13 (months 1 through 3). During this time, your baby will begin to develop inside you. At 6-8 weeks, the eyes and face are formed, and the heartbeat can be seen on ultrasound. At the end of 12 weeks, all the baby's organs are formed. Prenatal care is all the medical care you receive before the birth of your baby. Make sure you get good prenatal care and follow all of your doctor's instructions. Follow these instructions at home: Medicines  Take over-the-counter and prescription medicines only as told by your doctor. Some medicines are safe and some medicines are not safe during pregnancy.  Take a prenatal vitamin that contains at least 600 micrograms (mcg) of folic acid.  If you have trouble pooping (constipation), take medicine that will make your stool soft (stool softener) if your doctor approves. Eating and drinking  Eat regular, healthy meals.  Your doctor will tell you the amount of weight gain that is right for you.  Avoid raw meat and uncooked cheese.  If you feel sick to your stomach (nauseous) or throw up (vomit): ? Eat 4 or 5 small meals a day instead of 3 large meals. ? Try eating a few soda crackers. ? Drink liquids between meals instead of during meals.  To prevent constipation: ? Eat foods that are high in fiber, like fresh fruits and vegetables, whole grains, and beans. ? Drink enough fluids to keep your pee (urine) clear or pale yellow. Activity  Exercise only as told by your doctor. Stop exercising if you have cramps or pain in your lower belly (abdomen) or low back.  Do not exercise if it is too hot, too humid, or if you are in a place of great height (high altitude).  Try to avoid standing for long periods of time. Move your legs often if you must stand in one place for a long time.  Avoid heavy lifting.  Wear low-heeled shoes. Sit and stand up straight.  You  can have sex unless your doctor tells you not to. Relieving pain and discomfort  Wear a good support bra if your breasts are sore.  Take warm water baths (sitz baths) to soothe pain or discomfort caused by hemorrhoids. Use hemorrhoid cream if your doctor says it is okay.  Rest with your legs raised if you have leg cramps or low back pain.  If you have puffy, bulging veins (varicose veins) in your legs: ? Wear support hose or compression stockings as told by your doctor. ? Raise (elevate) your feet for 15 minutes, 3-4 times a day. ? Limit salt in your food. Prenatal care  Schedule your prenatal visits by the twelfth week of pregnancy.  Write down your questions. Take them to your prenatal visits.  Keep all your prenatal visits as told by your doctor. This is important. Safety  Wear your seat belt at all times when driving.  Make a list of emergency phone numbers. The list should include numbers for family, friends, the hospital, and police and fire departments. General instructions  Ask your doctor for a referral to a local prenatal class. Begin classes no later than at the start of month 6 of your pregnancy.  Ask for help if you need counseling or if you need help with nutrition. Your doctor can give you advice or tell you where to go for help.  Do not use hot tubs, steam rooms, or   not use hot tubs, steam rooms, or saunas.  Do not douche or use tampons or scented sanitary pads.  Do not cross your legs for long periods of time.  Avoid all herbs and alcohol. Avoid drugs that are not approved by your doctor.  Do not use any tobacco products, including cigarettes, chewing tobacco, and electronic cigarettes. If you need help quitting, ask your doctor. You may get counseling or other support to help you quit.  Avoid cat litter boxes and soil used by cats. These carry germs that can cause birth defects in the baby and can cause a loss of your baby (miscarriage) or stillbirth.  Visit your dentist. At home,  brush your teeth with a soft toothbrush. Be gentle when you floss. Contact a doctor if:  You are dizzy.  You have mild cramps or pressure in your lower belly.  You have a nagging pain in your belly area.  You continue to feel sick to your stomach, you throw up, or you have watery poop (diarrhea).  You have a bad smelling fluid coming from your vagina.  You have pain when you pee (urinate).  You have increased puffiness (swelling) in your face, hands, legs, or ankles. Get help right away if:  You have a fever.  You are leaking fluid from your vagina.  You have spotting or bleeding from your vagina.  You have very bad belly cramping or pain.  You gain or lose weight rapidly.  You throw up blood. It may look like coffee grounds.  You are around people who have [place] measles, fifth disease, or chickenpox.  You have a very bad headache.  You have shortness of breath.  You have any kind of trauma, such as from a fall or a car accident. Summary  The first trimester of pregnancy is from week 1 until the end of week 13 (months 1 through 3).  To take care of yourself and your unborn baby, you will need to eat healthy meals, take medicines only if your doctor tells you to do so, and do activities that are safe for you and your baby.  Keep all follow-up visits as told by your doctor. This is important as your doctor will have to ensure that your baby is healthy and growing well. This information is not intended to replace advice given to you by your health care provider. Make sure you discuss any questions you have with your health care provider. Document Released: 04/01/2008 Document Revised: 10/22/2016 Document Reviewed: 10/22/2016 Elsevier Interactive Patient Education  2017 [employer]     IF you received an x-ray today, you will receive an invoice from [facility]. Please contact [facility] at [phone] with questions or concerns regarding your  invoice.   IF you received labwork today, you will receive an invoice from [facility]. Please contact [facility] at [phone] with questions or concerns regarding your invoice.   Our billing staff will not be able to assist you with questions regarding bills from these companies.  You will be contacted with the lab results as soon as they are available. The fastest way to get your results is to activate your My Chart account. Instructions are located on the last page of this paperwork. If you have not heard from us regarding the results in 2 weeks, please contact this office.

## 2017-01-21 NOTE — Progress Notes (Signed)
PRIMARY CARE AT [facility] [address] [place] [address] [phone]  Date:  01/21/2017   Name:  Kristie Thornton   DOB:  06-24-1991   MRN:  [id number]  PCP:  Norberto Sorenson, MD    History of Present Illness:  Kristie Thornton is a 26 y.o. female patient who presents to PCP with  Chief Complaint  Patient presents with  . Possible Pregnancy    Needs note if positive     Patient is here today with a positive pregnancy test at home today.  She has missed her period and about 12 days past.  She is sexually active with 1 partner for the last 2 months, but is unsure if she had a sexual encounter close to this timeframe, with another person.  Patient's last menstrual period was 12/12/2016.  She has had a hx of ovarian cyst and was dxd with endometriosis.  No vaginal bleeding No dysuria, or hematuria.   No abdominal pain No lightheadedness. Abdominal cramping minimal. Not currently taking a prenatal First pregnancy.  Patient Active Problem List   Diagnosis Date Noted  . Generalized anxiety disorder 08/08/2014  . Endometriosis 08/08/2014  . Bilateral ovarian cysts 08/08/2014    Past Medical History:  Diagnosis Date  . Anxiety   . Depression   . PTSD (post-traumatic stress disorder)   . Suicide and self-inflicted injury (HCC)     No past surgical history on file.  Social History  Substance Use Topics  . Smoking status: Current Some Day Smoker    Last attempt to quit: 07/05/2014  . Smokeless tobacco: Former Neurosurgeon     Comment: previously vaped  . Alcohol use No    Family History  Problem Relation Age of Onset  . Cancer Mother     colon cancer  . Cancer Father 30    testicular cancer  . Fibromyalgia Sister   . ADD / ADHD Brother     Allergies  Allergen Reactions  . Sertraline Hcl Other (See Comments)    Suicidality    Medication list has been reviewed and updated.  No current outpatient prescriptions on file prior to visit.   No current facility-administered medications on  file prior to visit.     ROS ROS otherwise unremarkable unless listed above.  Physical Examination: BP 119/77   Pulse (!) 117   Temp 98.6 F (37 C) (Oral)   Resp 16   Ht  (1.575 m)   Wt 124 lb (56.2 kg)   LMP 12/12/2016   SpO2 100%   BMI 22.68 kg/m  Ideal Body Weight: Weight in (lb) to have BMI = 25: 136.4  Physical Exam  Constitutional: She is oriented to person, place, and time. She appears well-developed and well-nourished. No distress.  HENT:  Head: Normocephalic and atraumatic.  Right Ear: External ear normal.  Left Ear: External ear normal.  Eyes: Conjunctivae and EOM are normal. Pupils are equal, round, and reactive to light.  Cardiovascular: Normal rate.   Pulmonary/Chest: Effort normal. No respiratory distress.  Neurological: She is alert and oriented to person, place, and time.  Skin: She is not diaphoretic.  Psychiatric: She has a normal mood and affect. Her behavior is normal.    Results for orders placed or performed in visit on 01/21/17  POCT urine pregnancy  Result Value Ref Range   Preg Test, Ur Positive (A) Negative     Assessment and Plan: Kristie Thornton is a 26 y.o. female who is here today for positive pregnancy  test. Discussed alarming sxs to warrant an immediate return. Start prenatals which she will pick up otc.   Placing urgent referral to obgyn EDD 11.22.2018 by lmp. Less than [redacted] weeks gestation of pregnancy - Plan: Ambulatory referral to Obstetrics / Gynecology  Missed period - Plan: POCT urine pregnancy, Ambulatory referral to Obstetrics / Gynecology  Trena Platt, PA-C [facility] 3/27/20186:26 PM

## 2017-01-22 ENCOUNTER — Encounter: Payer: Self-pay | Admitting: Obstetrics & Gynecology

## 2017-02-07 ENCOUNTER — Telehealth: Payer: Self-pay | Admitting: Urgent Care

## 2017-02-07 NOTE — Telephone Encounter (Signed)
Wonder when last std check was done 10/2016 and negative

## 2017-02-07 NOTE — Telephone Encounter (Signed)
Pt called says she needs lab results ASAP.  Please Advise  [phone]

## 2017-02-07 NOTE — Telephone Encounter (Signed)
Already notified at ov [email or web address] if question

## 2017-02-11 ENCOUNTER — Other Ambulatory Visit (HOSPITAL_COMMUNITY)
Admission: RE | Admit: 2017-02-11 | Discharge: 2017-02-11 | Disposition: A | Discharge status: Home / Self Care | Payer: BLUE CROSS/BLUE SHIELD | Source: Ambulatory Visit | Attending: Obstetrics & Gynecology | Admitting: Obstetrics & Gynecology

## 2017-02-11 ENCOUNTER — Telehealth: Payer: Self-pay | Admitting: Family Medicine

## 2017-02-11 ENCOUNTER — Encounter (INDEPENDENT_AMBULATORY_CARE_PROVIDER_SITE_OTHER): Payer: BLUE CROSS/BLUE SHIELD | Admitting: Obstetrics & Gynecology

## 2017-02-11 ENCOUNTER — Encounter: Payer: Self-pay | Admitting: Obstetrics & Gynecology

## 2017-02-11 ENCOUNTER — Ambulatory Visit: Payer: Self-pay

## 2017-02-11 ENCOUNTER — Ambulatory Visit (INDEPENDENT_AMBULATORY_CARE_PROVIDER_SITE_OTHER): Payer: BLUE CROSS/BLUE SHIELD | Admitting: Obstetrics & Gynecology

## 2017-02-11 DIAGNOSIS — A5602 Chlamydial vulvovaginitis: Secondary | ICD-10-CM | POA: Diagnosis not present

## 2017-02-11 DIAGNOSIS — B9689 Other specified bacterial agents as the cause of diseases classified elsewhere: Secondary | ICD-10-CM | POA: Insufficient documentation

## 2017-02-11 DIAGNOSIS — Z202 Contact with and (suspected) exposure to infections with a predominantly sexual mode of transmission: Secondary | ICD-10-CM | POA: Diagnosis present

## 2017-02-11 DIAGNOSIS — N760 Acute vaginitis: Secondary | ICD-10-CM | POA: Insufficient documentation

## 2017-02-11 DIAGNOSIS — Z3403 Encounter for supervision of normal first pregnancy, third trimester: Secondary | ICD-10-CM | POA: Insufficient documentation

## 2017-02-11 DIAGNOSIS — O3680X0 Pregnancy with inconclusive fetal viability, not applicable or unspecified: Secondary | ICD-10-CM | POA: Diagnosis not present

## 2017-02-11 MED ORDER — AZITHROMYCIN 500 MG PO TABS: 1000.0000 mg | ORAL_TABLET | Freq: Once | ORAL | 1 refills | Status: AC

## 2017-02-11 NOTE — Patient Instructions (Signed)
First Trimester of Pregnancy The first trimester of pregnancy is from week 1 until the end of week 13 (months 1 through 3). A week after a sperm fertilizes an egg, the egg will implant on the wall of the uterus. This embryo will begin to develop into a baby. Genes from you and your partner will form the baby. The female genes will determine whether the baby will be a boy or a girl. At 6-8 weeks, the eyes and face will be formed, and the heartbeat can be seen on ultrasound. At the end of 12 weeks, all the baby's organs will be formed. Now that you are pregnant, you will want to do everything you can to have a healthy baby. Two of the most important things are to get good prenatal care and to follow your health care provider's instructions. Prenatal care is all the medical care you receive before the baby's birth. This care will help prevent, find, and treat any problems during the pregnancy and childbirth. Body changes during your first trimester Your body goes through many changes during pregnancy. The changes vary from woman to woman.  You may gain or lose a couple of pounds at first.  You may feel sick to your stomach (nauseous) and you may throw up (vomit). If the vomiting is uncontrollable, call your health care provider.  You may tire easily.  You may develop headaches that can be relieved by medicines. All medicines should be approved by your health care provider.  You may urinate more often. Painful urination may mean you have a bladder infection.  You may develop heartburn as a result of your pregnancy.  You may develop constipation because certain hormones are causing the muscles that push stool through your intestines to slow down.  You may develop hemorrhoids or swollen veins (varicose veins).  Your breasts may begin to grow larger and become tender. Your nipples may stick out more, and the tissue that surrounds them (areola) may become darker.  Your gums may bleed and may be  sensitive to brushing and flossing.  Dark spots or blotches (chloasma, mask of pregnancy) may develop on your face. This will likely fade after the baby is born.  Your menstrual periods will stop.  You may have a loss of appetite.  You may develop cravings for certain kinds of food.  You may have changes in your emotions from day to day, such as being excited to be pregnant or being concerned that something may go wrong with the pregnancy and baby.  You may have more vivid and strange dreams.  You may have changes in your hair. These can include thickening of your hair, rapid growth, and changes in texture. Some women also have hair loss during or after pregnancy, or hair that feels dry or thin. Your hair will most likely return to normal after your baby is born.  What to expect at prenatal visits During a routine prenatal visit:  You will be weighed to make sure you and the baby are growing normally.  Your blood pressure will be taken.  Your abdomen will be measured to track your baby's growth.  The fetal heartbeat will be listened to between weeks 10 and 14 of your pregnancy.  Test results from any previous visits will be discussed.  Your health care provider may ask you:  How you are feeling.  If you are feeling the baby move.  If you have had any abnormal symptoms, such as leaking fluid, bleeding, severe headaches,   or abdominal cramping.  If you are using any tobacco products, including cigarettes, chewing tobacco, and electronic cigarettes.  If you have any questions.  Other tests that may be performed during your first trimester include:  Blood tests to find your blood type and to check for the presence of any previous infections. The tests will also be used to check for low iron levels (anemia) and protein on red blood cells (Rh antibodies). Depending on your risk factors, or if you previously had diabetes during pregnancy, you may have tests to check for high blood  sugar that affects pregnant women (gestational diabetes).  Urine tests to check for infections, diabetes, or protein in the urine.  An ultrasound to confirm the proper growth and development of the baby.  Fetal screens for spinal cord problems (spina bifida) and Down syndrome.  HIV (human immunodeficiency virus) testing. Routine prenatal testing includes screening for HIV, unless you choose not to have this test.  You may need other tests to make sure you and the baby are doing well.  Follow these instructions at home: Medicines  Follow your health care provider's instructions regarding medicine use. Specific medicines may be either safe or unsafe to take during pregnancy.  Take a prenatal vitamin that contains at least 600 micrograms (mcg) of folic acid.  If you develop constipation, try taking a stool softener if your health care provider approves. Eating and drinking  Eat a balanced diet that includes fresh fruits and vegetables, whole grains, good sources of protein such as meat, eggs, or tofu, and low-fat dairy. Your health care provider will help you determine the amount of weight gain that is right for you.  Avoid raw meat and uncooked cheese. These carry germs that can cause birth defects in the baby.  Eating four or five small meals rather than three large meals a day may help relieve nausea and vomiting. If you start to feel nauseous, eating a few soda crackers can be helpful. Drinking liquids between meals, instead of during meals, also seems to help ease nausea and vomiting.  Limit foods that are high in fat and processed sugars, such as fried and sweet foods.  To prevent constipation: ? Eat foods that are high in fiber, such as fresh fruits and vegetables, whole grains, and beans. ? Drink enough fluid to keep your urine clear or pale yellow. Activity  Exercise only as directed by your health care provider. Most women can continue their usual exercise routine during  pregnancy. Try to exercise for 30 minutes at least 5 days a week. Exercising will help you: ? Control your weight. ? Stay in shape. ? Be prepared for labor and delivery.  Experiencing pain or cramping in the lower abdomen or lower back is a good sign that you should stop exercising. Check with your health care provider before continuing with normal exercises.  Try to avoid standing for long periods of time. Move your legs often if you must stand in one place for a long time.  Avoid heavy lifting.  Wear low-heeled shoes and practice good posture.  You may continue to have sex unless your health care provider tells you not to. Relieving pain and discomfort  Wear a good support bra to relieve breast tenderness.  Take warm sitz baths to soothe any pain or discomfort caused by hemorrhoids. Use hemorrhoid cream if your health care provider approves.  Rest with your legs elevated if you have leg cramps or low back pain.  If you develop   varicose veins in your legs, wear support hose. Elevate your feet for 15 minutes, 3-4 times a day. Limit salt in your diet. Prenatal care  Schedule your prenatal visits by the twelfth week of pregnancy. They are usually scheduled monthly at first, then more often in the last 2 months before delivery.  Write down your questions. Take them to your prenatal visits.  Keep all your prenatal visits as told by your health care provider. This is important. Safety  Wear your seat belt at all times when driving.  Make a list of emergency phone numbers, including numbers for family, friends, the hospital, and police and fire departments. General instructions  Ask your health care provider for a referral to a local prenatal education class. Begin classes no later than the beginning of month 6 of your pregnancy.  Ask for help if you have counseling or nutritional needs during pregnancy. Your health care provider can offer advice or refer you to specialists for help  with various needs.  Do not use hot tubs, steam rooms, or saunas.  Do not douche or use tampons or scented sanitary pads.  Do not cross your legs for long periods of time.  Avoid cat litter boxes and soil used by cats. These carry germs that can cause birth defects in the baby and possibly loss of the fetus by miscarriage or stillbirth.  Avoid all smoking, herbs, alcohol, and medicines not prescribed by your health care provider. Chemicals in these products affect the formation and growth of the baby.  Do not use any products that contain nicotine or tobacco, such as cigarettes and e-cigarettes. If you need help quitting, ask your health care provider. You may receive counseling support and other resources to help you quit.  Schedule a dentist appointment. At home, brush your teeth with a soft toothbrush and be gentle when you floss. Contact a health care provider if:  You have dizziness.  You have mild pelvic cramps, pelvic pressure, or nagging pain in the abdominal area.  You have persistent nausea, vomiting, or diarrhea.  You have a bad smelling vaginal discharge.  You have pain when you urinate.  You notice increased swelling in your face, hands, legs, or ankles.  You are exposed to fifth disease or chickenpox.  You are exposed to German measles (rubella) and have never had it. Get help right away if:  You have a fever.  You are leaking fluid from your vagina.  You have spotting or bleeding from your vagina.  You have severe abdominal cramping or pain.  You have rapid weight gain or loss.  You vomit blood or material that looks like coffee grounds.  You develop a severe headache.  You have shortness of breath.  You have any kind of trauma, such as from a fall or a car accident. Summary  The first trimester of pregnancy is from week 1 until the end of week 13 (months 1 through 3).  Your body goes through many changes during pregnancy. The changes vary from  woman to woman.  You will have routine prenatal visits. During those visits, your health care provider will examine you, discuss any test results you may have, and talk with you about how you are feeling. This information is not intended to replace advice given to you by your health care provider. Make sure you discuss any questions you have with your health care provider. Document Released: 10/08/2001 Document Revised: 09/25/2016 Document Reviewed: 09/25/2016 Elsevier Interactive Patient Education  2017 Elsevier   Inc.  

## 2017-02-11 NOTE — Progress Notes (Signed)
First Trimester Screen scheduled for May 29th @ 0930. Pt notified.

## 2017-02-11 NOTE — Progress Notes (Signed)
Pt informed that the ultrasound is considered a limited OB ultrasound and is not intended to be a complete ultrasound exam.  Patient also informed that the ultrasound is not being completed with the intent of assessing for fetal or placental anomalies or any pelvic abnormalities.  Explained that the purpose of today's ultrasound is to assess for viability.  Patient acknowledges the purpose of the exam and the limitations of the study.    Single IUP FHR - 156 bpm per M-mode CRL - 0.983 cm  (7w 0d) Dr. Debroah Loop informed

## 2017-02-11 NOTE — Telephone Encounter (Signed)
Pt is needing a refill on hyoschmine  For ibs  [phone]

## 2017-02-11 NOTE — Progress Notes (Signed)
Ultrasounds Results Note  SUBJECTIVE HPI:  [name] is a 26 y.o. G0 at Unknown by LMP who presents to the [facility] for followup ultrasound results done today in clinic The patient denies abdominal pain or vaginal bleeding.  Upon review of the patient's records, patient was first seen with positive UCG ultrasound was performed earlier today.   Past Medical History:  Diagnosis Date  . Anxiety   . Depression   . PTSD (post-traumatic stress disorder)   . Suicide and self-inflicted injury (HCC)    No past surgical history on file. Social History   Social History  . Marital status: Single    Spouse name: n/a  . Number of children: 0  . Years of education: 12th +   Occupational History  . Camera operator   Social History Main Topics  . Smoking status: Current Some Day Smoker    Last attempt to quit: 07/05/2014  . Smokeless tobacco: Former Neurosurgeon     Comment: previously vaped  . Alcohol use No  . Drug use: No  . Sexual activity: Yes    Partners: Male    Birth control/ protection: Pill   Other Topics Concern  . Not on file   Social History Narrative   Parents are from [place]. Parents and her siblings were born in the [place].   Nail tech   Marital status: single; not dating      Children:  None     Lives: alone      Employment: unemployed      Tobacco:  None      Alcohol: weekends; no DWIs      Drugs: marijuana socially         Current Outpatient Prescriptions on File Prior to Visit  Medication Sig Dispense Refill  . Prenatal Vit-Fe Fumarate-FA (MULTIVITAMIN-PRENATAL) 27-0.8 MG TABS tablet Take 1 tablet by mouth daily at 12 noon.     No current facility-administered medications on file prior to visit.    Allergies  Allergen Reactions  . Sertraline Hcl Other (See Comments)    Suicidality    I have reviewed patient's Past Medical Hx, Surgical Hx, Family Hx, Social Hx, medications and allergies.   Review of Systems Review of Systems   Constitutional: Negative for fever and chills.  Gastrointestinal: Negative for nausea, vomiting, abdominal pain, diarrhea and constipation.  Genitourinary: Negative for dysuria.  Musculoskeletal: Negative for back pain.  Neurological: Negative for dizziness and weakness.    Physical Exam  BP (!) 109/58   Pulse 75   Wt 127 lb (57.6 kg)   LMP 12/12/2016 (Exact Date)   BMI 23.23 kg/m   GENERAL: Well-developed, well-nourished female in no acute distress.  HEENT: Normocephalic, atraumatic.   LUNGS: Effort normal Pelvic exam: normal external genitalia, vulva, vagina, cervix, uterus and adnexa, VULVA: normal appearing vulva with no masses, tenderness or lesions, VAGINA: vaginal discharge - white and milky, CERVIX: cervical discharge present - white and milky.  HEART: Regular rate  SKIN: Warm, dry and without erythema PSYCH: Normal mood and affect NEURO: Alert and oriented x 4  LAB RESULTS No results found for this or any previous visit (from the past 24 hour(s)).  IMAGING No results found.  ASSESSMENT 1. Chlamydia contact   2. Encounter for supervision of normal first pregnancy in third trimester    Zithromax Rx PLAN Discharge home in stable condition Patient advised to start/continue taking prenatal vitamins STD testing and NOB labs Pregnancy confirmation  letter given Patient advised to start prenatal care with OB provider of choice as soon as possible First screen  Adam Phenix, MD  02/11/2017  10:14 AM

## 2017-02-12 LAB — OBSTETRIC PANEL, INCLUDING HIV
Antibody Screen: NEGATIVE
BASOS: 0 %
Basophils Absolute: 0 10*3/uL (ref 0.0–0.2)
EOS (ABSOLUTE): 0.1 10*3/uL (ref 0.0–0.4)
EOS: 1 %
HEMATOCRIT: 39.2 % (ref 34.0–46.6)
HEMOGLOBIN: 13.3 g/dL (ref 11.1–15.9)
HIV SCREEN 4TH GENERATION: NONREACTIVE
Hepatitis B Surface Ag: NEGATIVE
IMMATURE GRANS (ABS): 0 10*3/uL (ref 0.0–0.1)
Immature Granulocytes: 0 %
LYMPHS ABS: 1.9 10*3/uL (ref 0.7–3.1)
LYMPHS: 24 %
MCH: 33.4 pg — AB (ref 26.6–33.0)
MCHC: 33.9 g/dL (ref 31.5–35.7)
MCV: 99 fL — AB (ref 79–97)
MONOS ABS: 0.5 10*3/uL (ref 0.1–0.9)
Monocytes: 7 %
NEUTROS ABS: 5.4 10*3/uL (ref 1.4–7.0)
Neutrophils: 68 %
Platelets: 323 10*3/uL (ref 150–379)
RBC: 3.98 x10E6/uL (ref 3.77–5.28)
RDW: 13.3 % (ref 12.3–15.4)
RH TYPE: POSITIVE
RPR Ser Ql: NONREACTIVE
Rubella Antibodies, IGG: <0.9 index — ABNORMAL LOW (ref >0.99)
WBC: 7.9 10*3/uL (ref 3.4–10.8)

## 2017-02-12 LAB — CERVICOVAGINAL ANCILLARY ONLY
Bacterial vaginitis: POSITIVE — AB
CANDIDA VAGINITIS: NEGATIVE
Chlamydia: POSITIVE — AB
Neisseria Gonorrhea: NEGATIVE
Trichomonas: NEGATIVE

## 2017-02-14 NOTE — Telephone Encounter (Signed)
I donot see hisycomine on current meds list

## 2017-02-14 NOTE — Telephone Encounter (Signed)
I have never seen this pt. Looks like this was rx'ed once 3 yrs prior by a physician who is no longer here so I rec pt come in to discuss this problem/trx with her PCP here.

## 2017-02-14 NOTE — Telephone Encounter (Signed)
l/m to make an appt

## 2017-02-15 ENCOUNTER — Inpatient Hospital Stay (HOSPITAL_COMMUNITY)
Admission: AD | Admit: 2017-02-15 | Discharge: 2017-02-16 | Disposition: A | Discharge status: Home / Self Care | Payer: BLUE CROSS/BLUE SHIELD | Source: Ambulatory Visit | Attending: Obstetrics and Gynecology | Admitting: Obstetrics and Gynecology

## 2017-02-15 ENCOUNTER — Encounter (HOSPITAL_COMMUNITY): Payer: Self-pay

## 2017-02-15 DIAGNOSIS — Z915 Personal history of self-harm: Secondary | ICD-10-CM | POA: Diagnosis not present

## 2017-02-15 DIAGNOSIS — O209 Hemorrhage in early pregnancy, unspecified: Secondary | ICD-10-CM | POA: Diagnosis not present

## 2017-02-15 DIAGNOSIS — O98311 Other infections with a predominantly sexual mode of transmission complicating pregnancy, first trimester: Secondary | ICD-10-CM | POA: Diagnosis not present

## 2017-02-15 DIAGNOSIS — A749 Chlamydial infection, unspecified: Secondary | ICD-10-CM | POA: Insufficient documentation

## 2017-02-15 DIAGNOSIS — Z3A01 Less than 8 weeks gestation of pregnancy: Secondary | ICD-10-CM | POA: Diagnosis not present

## 2017-02-15 DIAGNOSIS — Z87891 Personal history of nicotine dependence: Secondary | ICD-10-CM | POA: Diagnosis not present

## 2017-02-15 DIAGNOSIS — O4690 Antepartum hemorrhage, unspecified, unspecified trimester: Secondary | ICD-10-CM | POA: Insufficient documentation

## 2017-02-15 DIAGNOSIS — F4310 Post-traumatic stress disorder, unspecified: Secondary | ICD-10-CM | POA: Insufficient documentation

## 2017-02-15 DIAGNOSIS — Z888 Allergy status to other drugs, medicaments and biological substances status: Secondary | ICD-10-CM | POA: Diagnosis not present

## 2017-02-15 DIAGNOSIS — O99341 Other mental disorders complicating pregnancy, first trimester: Secondary | ICD-10-CM | POA: Insufficient documentation

## 2017-02-15 DIAGNOSIS — O98811 Other maternal infectious and parasitic diseases complicating pregnancy, first trimester: Secondary | ICD-10-CM

## 2017-02-15 DIAGNOSIS — Z202 Contact with and (suspected) exposure to infections with a predominantly sexual mode of transmission: Secondary | ICD-10-CM | POA: Insufficient documentation

## 2017-02-15 HISTORY — DX: Personal history of other diseases of the digestive system: Z87.19

## 2017-02-15 LAB — URINALYSIS, ROUTINE W REFLEX MICROSCOPIC
BILIRUBIN URINE: NEGATIVE
GLUCOSE, UA: NEGATIVE mg/dL
HGB URINE DIPSTICK: NEGATIVE
Ketones, ur: 5 mg/dL — AB
Leukocytes, UA: NEGATIVE
Nitrite: NEGATIVE
PROTEIN: NEGATIVE mg/dL
Specific Gravity, Urine: 1.005 (ref 1.005–1.030)
pH: 7 (ref 5.0–8.0)

## 2017-02-15 NOTE — MAU Note (Signed)
Pt reports bleeding since 7pm. She noticed a few drops of blood in the toilet and then some pink on the tissue. Pain 3/10 cramping.

## 2017-02-15 NOTE — MAU Note (Signed)
Vaginal bleeding since 7 pm, like I just started my period but I'm [redacted] weeks pregnant.  No clots.  Crampy low abd pain and upper and lower back pain since started same time. Also wants to know if her chlamydia test was positive that was collected on 4/17 here. Picked up  z-pack precription yesterday but was not told her results yet.

## 2017-02-16 DIAGNOSIS — O209 Hemorrhage in early pregnancy, unspecified: Secondary | ICD-10-CM | POA: Diagnosis not present

## 2017-02-16 DIAGNOSIS — O4690 Antepartum hemorrhage, unspecified, unspecified trimester: Secondary | ICD-10-CM | POA: Diagnosis not present

## 2017-02-16 NOTE — MAU Provider Note (Signed)
Chief Complaint: Vaginal Bleeding   First Provider Initiated Contact with Patient 02/15/17 2345      SUBJECTIVE HPI: Kristie Thornton is a 26 y.o. G1P0 at [redacted]w[redacted]d by 7 week [place] who presents to maternity admissions reporting light vaginal bleeding when wiping x 2 today. She took azithromycin yesterday, prescribed because of exposure to chlamydia since her boyfriend tested positive. Her test done at [facility] was also positive.  She reports mild cramping associated with the bleeding. There are no other associated symptoms.  She has not tried any treatments. She denies vaginal itching/burning, urinary symptoms, h/a, dizziness, n/v, or fever/chills.     HPI  Past Medical History:  Diagnosis Date  . Anxiety   . Depression   . History of IBS   . PTSD (post-traumatic stress disorder)   . Suicide and self-inflicted injury [facility])    History reviewed. No pertinent surgical history. Social History   Social History  . Marital status: Single    Spouse name: n/a  . Number of children: 0  . Years of education: 12th +   Occupational History  . Camera operator   Social History Main Topics  . Smoking status: Former Smoker    Quit date: 07/05/2014  . Smokeless tobacco: Former Neurosurgeon     Comment: previously vaped  . Alcohol use No  . Drug use: No  . Sexual activity: Yes    Partners: Male   Other Topics Concern  . Not on file   Social History Narrative   Parents are from [place]. Parents and her siblings were born in the [place].   Nail tech   Marital status: single; not dating      Children:  None     Lives: alone      Employment: unemployed      Tobacco:  None      Alcohol: weekends; no DWIs      Drugs: marijuana socially         No current facility-administered medications on file prior to encounter.    Current Outpatient Prescriptions on File Prior to Encounter  Medication Sig Dispense Refill  . Prenatal Vit-Fe Fumarate-FA (MULTIVITAMIN-PRENATAL) 27-0.8 MG TABS tablet Take 1 tablet by  mouth daily at 12 noon.     Allergies  Allergen Reactions  . Sertraline Hcl Other (See Comments)    Suicidality    ROS:  Review of Systems  Constitutional: Negative for chills, fatigue and fever.  Respiratory: Negative for shortness of breath.   Cardiovascular: Negative for chest pain.  Gastrointestinal: Positive for abdominal pain. Negative for nausea and vomiting.  Genitourinary: Positive for pelvic pain and vaginal bleeding. Negative for difficulty urinating, dysuria, flank pain, vaginal discharge and vaginal pain.  Neurological: Negative for dizziness and headaches.  Psychiatric/Behavioral: Negative.      I have reviewed patient's Past Medical Hx, Surgical Hx, Family Hx, Social Hx, medications and allergies.   Physical Exam   Patient Vitals for the past 24 hrs:  BP Temp Temp src Pulse Resp SpO2 Height Weight  02/16/17 0010 109/62 - - 81 18 - - -  02/15/17 2233 107/62 98.8 F (37.1 C) Oral 91 16 100 % 5' 0.5" (1.537 m) 129 lb 12 oz (58.9 kg)   Constitutional: Well-developed, well-nourished female in no acute distress.  Cardiovascular: normal rate Respiratory: normal effort GI: Abd soft, non-tender. Pos BS x 4 MS: Extremities nontender, no edema, normal ROM Neurologic: Alert and oriented x 4.  GU: Neg CVAT.  PELVIC EXAM: Cervix pink, visually closed, without lesion, scantlight brown/tan discharge, vaginal walls and external genitalia normal Bimanual exam: Cervix 0/long/high, firm, anterior, neg CMT, uterus nontender, slightly enlarged, adnexa without tenderness, enlargement, or mass  Bedside US reveals normal FHR, pt reassured by imaging.  LAB RESULTS Results for orders placed or performed during the hospital encounter of 02/15/17 (from the past 24 hour(s))  Urinalysis, Routine w reflex microscopic     Status: Abnormal   Collection Time: 02/15/17 10:30 PM  Result Value Ref Range   Color, Urine STRAW (A) YELLOW   APPearance CLEAR CLEAR   Specific Gravity, Urine  1.005 1.005 - 1.030   pH 7.0 5.0 - 8.0   Glucose, UA NEGATIVE NEGATIVE mg/dL   Hgb urine dipstick NEGATIVE NEGATIVE   Bilirubin Urine NEGATIVE NEGATIVE   Ketones, ur 5 (A) NEGATIVE mg/dL   Protein, ur NEGATIVE NEGATIVE mg/dL   Nitrite NEGATIVE NEGATIVE   Leukocytes, UA NEGATIVE NEGATIVE    A/Positive/-- (04/17 1022)  IMAGING No results found.  MAU Management/MDM: Ordered labs and reviewed results.  IUP, confirmed on previous study still visible today.  Likely bleeding r/t recent chlamydia infection just treated yesterday.  Bleeding precautions reviewed, reassurance provided.  Pt stable at time of discharge.  ASSESSMENT 1. Vaginal bleeding in pregnancy, first trimester   2. Chlamydia infection affecting pregnancy in first trimester     PLAN Discharge home with bleeding precautions  Allergies as of 02/16/2017      Reactions   Sertraline Hcl Other (See Comments)   Suicidality      Medication List    TAKE these medications   multivitamin-prenatal 27-0.8 MG Tabs tablet Take 1 tablet by mouth daily at 12 noon.      Follow-up Information    [facility] for [facility] Follow up.   Specialty:  Obstetrics and Gynecology Why:  As scheduled, return to MAU as needed Contact information: [address] [place] [address] [phone]          Sharen Counter Certified Nurse-Midwife 02/16/2017  7:02 AM

## 2017-02-17 ENCOUNTER — Telehealth: Payer: Self-pay

## 2017-02-17 ENCOUNTER — Ambulatory Visit: Payer: BLUE CROSS/BLUE SHIELD

## 2017-02-17 DIAGNOSIS — N760 Acute vaginitis: Principal | ICD-10-CM

## 2017-02-17 DIAGNOSIS — B9689 Other specified bacterial agents as the cause of diseases classified elsewhere: Secondary | ICD-10-CM

## 2017-02-17 MED ORDER — METRONIDAZOLE 500 MG PO TABS
500.0000 mg | ORAL_TABLET | Freq: Two times a day (BID) | ORAL | 0 refills | Status: DC
Start: 2017-02-17 — End: 2017-03-12

## 2017-02-17 MED ORDER — METRONIDAZOLE 500 MG PO TABS: 500.0000 mg | ORAL_TABLET | Freq: Two times a day (BID) | ORAL | 0 refills | Status: DC

## 2017-02-17 NOTE — Telephone Encounter (Signed)
Called patient and informed her of BV and chlamydia. Patient states she is aware of chlamydia and was treated in MAU. Informed patient of flagyl sent to pharmacy. Patient verbalized understanding and had no questions

## 2017-02-17 NOTE — Telephone Encounter (Signed)
Pt called and stated that she had a lab on Tuesday.  Pt requested results.

## 2017-03-03 NOTE — Progress Notes (Signed)
This encounter was created in error - please disregard.

## 2017-03-12 ENCOUNTER — Encounter: Payer: Self-pay | Admitting: Medical

## 2017-03-12 ENCOUNTER — Other Ambulatory Visit (HOSPITAL_COMMUNITY)
Admission: RE | Admit: 2017-03-12 | Discharge: 2017-03-12 | Disposition: A | Discharge status: Home / Self Care | Payer: BLUE CROSS/BLUE SHIELD | Source: Ambulatory Visit | Attending: Medical | Admitting: Medical

## 2017-03-12 ENCOUNTER — Ambulatory Visit (INDEPENDENT_AMBULATORY_CARE_PROVIDER_SITE_OTHER): Payer: BLUE CROSS/BLUE SHIELD | Admitting: Medical

## 2017-03-12 VITALS — BP 108/62 | HR 75 | Wt 125.1 lb

## 2017-03-12 DIAGNOSIS — O0991 Supervision of high risk pregnancy, unspecified, first trimester: Secondary | ICD-10-CM | POA: Diagnosis not present

## 2017-03-12 DIAGNOSIS — O99341 Other mental disorders complicating pregnancy, first trimester: Secondary | ICD-10-CM | POA: Insufficient documentation

## 2017-03-12 DIAGNOSIS — Z113 Encounter for screening for infections with a predominantly sexual mode of transmission: Secondary | ICD-10-CM | POA: Diagnosis not present

## 2017-03-12 DIAGNOSIS — F411 Generalized anxiety disorder: Secondary | ICD-10-CM

## 2017-03-12 DIAGNOSIS — Z3A11 11 weeks gestation of pregnancy: Secondary | ICD-10-CM | POA: Insufficient documentation

## 2017-03-12 DIAGNOSIS — Z3403 Encounter for supervision of normal first pregnancy, third trimester: Secondary | ICD-10-CM

## 2017-03-12 DIAGNOSIS — Z3401 Encounter for supervision of normal first pregnancy, first trimester: Secondary | ICD-10-CM

## 2017-03-12 DIAGNOSIS — O0990 Supervision of high risk pregnancy, unspecified, unspecified trimester: Secondary | ICD-10-CM | POA: Diagnosis present

## 2017-03-12 LAB — POCT URINALYSIS DIP (DEVICE)
BILIRUBIN URINE: NEGATIVE
Glucose, UA: NEGATIVE mg/dL
Hgb urine dipstick: NEGATIVE
Ketones, ur: NEGATIVE mg/dL
Nitrite: NEGATIVE
Protein, ur: NEGATIVE mg/dL
Specific Gravity, Urine: 1.015 (ref 1.005–1.030)
UROBILINOGEN UA: 0.2 mg/dL (ref 0.0–1.0)
pH: 7 (ref 5.0–8.0)

## 2017-03-12 NOTE — Progress Notes (Signed)
Here for first prenatal visit. Given prenatal education booklets. Declines flu shot. Declines [employer] app.

## 2017-03-12 NOTE — Patient Instructions (Signed)
First Trimester of Pregnancy The first trimester of pregnancy is from week 1 until the end of week 13 (months 1 through 3). During this time, your baby will begin to develop inside you. At 6-8 weeks, the eyes and face are formed, and the heartbeat can be seen on ultrasound. At the end of 12 weeks, all the baby's organs are formed. Prenatal care is all the medical care you receive before the birth of your baby. Make sure you get good prenatal care and follow all of your doctor's instructions. Follow these instructions at home: Medicines  Take over-the-counter and prescription medicines only as told by your doctor. Some medicines are safe and some medicines are not safe during pregnancy.  Take a prenatal vitamin that contains at least 600 micrograms (mcg) of folic acid.  If you have trouble pooping (constipation), take medicine that will make your stool soft (stool softener) if your doctor approves. Eating and drinking  Eat regular, healthy meals.  Your doctor will tell you the amount of weight gain that is right for you.  Avoid raw meat and uncooked cheese.  If you feel sick to your stomach (nauseous) or throw up (vomit): ? Eat 4 or 5 small meals a day instead of 3 large meals. ? Try eating a few soda crackers. ? Drink liquids between meals instead of during meals.  To prevent constipation: ? Eat foods that are high in fiber, like fresh fruits and vegetables, whole grains, and beans. ? Drink enough fluids to keep your pee (urine) clear or pale yellow. Activity  Exercise only as told by your doctor. Stop exercising if you have cramps or pain in your lower belly (abdomen) or low back.  Do not exercise if it is too hot, too humid, or if you are in a place of great height (high altitude).  Try to avoid standing for long periods of time. Move your legs often if you must stand in one place for a long time.  Avoid heavy lifting.  Wear low-heeled shoes. Sit and stand up straight.  You  can have sex unless your doctor tells you not to. Relieving pain and discomfort  Wear a good support bra if your breasts are sore.  Take warm water baths (sitz baths) to soothe pain or discomfort caused by hemorrhoids. Use hemorrhoid cream if your doctor says it is okay.  Rest with your legs raised if you have leg cramps or low back pain.  If you have puffy, bulging veins (varicose veins) in your legs: ? Wear support hose or compression stockings as told by your doctor. ? Raise (elevate) your feet for 15 minutes, 3-4 times a day. ? Limit salt in your food. Prenatal care  Schedule your prenatal visits by the twelfth week of pregnancy.  Write down your questions. Take them to your prenatal visits.  Keep all your prenatal visits as told by your doctor. This is important. Safety  Wear your seat belt at all times when driving.  Make a list of emergency phone numbers. The list should include numbers for family, friends, the hospital, and police and fire departments. General instructions  Ask your doctor for a referral to a local prenatal class. Begin classes no later than at the start of month 6 of your pregnancy.  Ask for help if you need counseling or if you need help with nutrition. Your doctor can give you advice or tell you where to go for help.  Do not use hot tubs, steam rooms, or   not use hot tubs, steam rooms, or saunas.  Do not douche or use tampons or scented sanitary pads.  Do not cross your legs for long periods of time.  Avoid all herbs and alcohol. Avoid drugs that are not approved by your doctor.  Do not use any tobacco products, including cigarettes, chewing tobacco, and electronic cigarettes. If you need help quitting, ask your doctor. You may get counseling or other support to help you quit.  Avoid cat litter boxes and soil used by cats. These carry germs that can cause birth defects in the baby and can cause a loss of your baby (miscarriage) or stillbirth.  Visit your dentist. At home,  brush your teeth with a soft toothbrush. Be gentle when you floss. Contact a doctor if:  You are dizzy.  You have mild cramps or pressure in your lower belly.  You have a nagging pain in your belly area.  You continue to feel sick to your stomach, you throw up, or you have watery poop (diarrhea).  You have a bad smelling fluid coming from your vagina.  You have pain when you pee (urinate).  You have increased puffiness (swelling) in your face, hands, legs, or ankles. Get help right away if:  You have a fever.  You are leaking fluid from your vagina.  You have spotting or bleeding from your vagina.  You have very bad belly cramping or pain.  You gain or lose weight rapidly.  You throw up blood. It may look like coffee grounds.  You are around people who have [place] measles, fifth disease, or chickenpox.  You have a very bad headache.  You have shortness of breath.  You have any kind of trauma, such as from a fall or a car accident. Summary  The first trimester of pregnancy is from week 1 until the end of week 13 (months 1 through 3).  To take care of yourself and your unborn baby, you will need to eat healthy meals, take medicines only if your doctor tells you to do so, and do activities that are safe for you and your baby.  Keep all follow-up visits as told by your doctor. This is important as your doctor will have to ensure that your baby is healthy and growing well. This information is not intended to replace advice given to you by your health care provider. Make sure you discuss any questions you have with your health care provider. Document Released: 04/01/2008 Document Revised: 10/22/2016 Document Reviewed: 10/22/2016 Elsevier Interactive Patient Education  2017 [employer] Dental Work and Pregnancy Proper dental care before, during, and after pregnancy is important for you and your baby. Pregnancy hormones can sometimes cause the gums to swell, which makes it  easier for food to become trapped between teeth. The health of your teeth and gums can affect your growing baby. Dental care recommendations To help prevent infection and maintain healthy teeth and gums, a thorough oral examination is recommended for all women during the first trimester of pregnancy. Routine cleanings and examinations are recommended throughout pregnancy. Dental care considerations  Tell your dentist if you are pregnant or you plan to become pregnant.  If you are pregnant, avoid routine X-ray exams until after your baby is born. If you are trying to become pregnant, you do not need to avoid X-rays.  If you need an emergency procedure that includes a dental X-ray exam during pregnancy, very low levels of radiation will be used, and lead aprons can be  used to protect you from radiation.  Your dentist will discuss the risks and benefits of having dental procedures during pregnancy. If possible, it is best to have dental procedures (such as cavity fillings and crown repair) during the second trimester of pregnancy or after your baby is born.  If you and your dentist decide to postpone a procedure for any reason, your dentist can recommend treatment to lower the chances of infection until the procedure is performed. This may involve taking certain medicines that are safe to take during pregnancy, such as penicillin or amoxicillin. Follow these instructions at home:  Practice good oral hygiene habits at home:  Brush your teeth twice a day with fluoride toothpaste. Brush thoroughly for at least 2 minutes. If you have morning sickness, avoid strongly-flavored toothpastes.  Floss at least once a day.  Visit your dentist to have regular oral exams and cleanings, and if you experience oral problems.  Eat a well-balanced diet that is low in sugar and carbohydrates.  If you vomit, rinse your mouth with water afterward.  Keep all follow-up visits as told by your dentist. This is  important. Seek dental care if:  You develop any of the following oral symptoms or they get worse:  Pain.  Bleeding.  Swelling.  Inflammation.  You develop growths or swelling between teeth. Get help right away if:  You have a fever or chills. Summary  Proper dental care before, during, and after pregnancy is important for you and your baby.  If you are pregnant, routine X-ray exams should be avoided until after your baby is born.  Your dentist will help you consider the risks and benefits of dental procedures during pregnancy.  You should brush your teeth with fluoride toothpaste twice a day and floss at least once a day. This information is not intended to replace advice given to you by your health care provider. Make sure you discuss any questions you have with your health care provider. Document Released: 04/03/2010 Document Revised: 09/28/2016 Document Reviewed: 09/28/2016 Elsevier Interactive Patient Education  2017 [employer].

## 2017-03-12 NOTE — Progress Notes (Signed)
   PRENATAL VISIT NOTE  Subjective:  Kristie Thornton is a 26 y.o. G1P0 at 1151w1d being seen today for her first prenatal visit.  She is currently monitored for the following issues for this low-risk pregnancy and has Generalized anxiety disorder; Endometriosis; Chlamydia contact; and Encounter for supervision of normal first pregnancy in third trimester on her problem list.  Patient reports no complaints.  Contractions: Not present. Vag. Bleeding: None.  Movement: Absent. Denies leaking of fluid.   The following portions of the patient's history were reviewed and updated as appropriate: allergies, current medications, past family history, past medical history, past social history, past surgical history and problem list. Problem list updated.  Objective:   Vitals:   03/12/17 1247  BP: 108/62  Pulse: 75  Weight: 125 lb 1.6 oz (56.7 kg)    Fetal Status: Fetal Heart Rate (bpm): 160   Movement: Absent     General:  Alert, oriented and cooperative. Patient is in no acute distress.  Skin: Skin is warm and dry. No rash noted.   Cardiovascular: Normal heart rate and rhythm noted  Respiratory: Normal respiratory effort, no problems with respiration noted, clear to auscultation bilaterally  Abdomen: Soft, gravid, appropriate for gestational age. Normal bowel sounds. Non-tender. Pain/Pressure: Absent     Pelvic:  Cervical exam performed Dilation: Closed Effacement (%): Thick    Extremities: Normal range of motion.  Edema: Trace  Mental Status: Normal mood and affect. Normal behavior. Normal judgment and thought content.   Assessment and Plan:  Pregnancy: G1P0 at 4551w1d  1. Supervision of high risk pregnancy, antepartum - GC/Chlamydia probe amp ([place] - TOC today  - Dental letter given  - Scheduled for NT 03/25/17  2. Generalized anxiety disorder   first trimester warning symptoms and general obstetric precautions including but not limited to vaginal bleeding, contractions,  leaking of fluid and fetal movement were reviewed in detail with the patient. Please refer to After Visit Summary for other counseling recommendations.  Return in about 4 weeks (around 04/09/2017) for LOB.   Marny LowensteinWenzel, Gerrad Welker N, PA-C

## 2017-03-13 ENCOUNTER — Telehealth: Payer: Self-pay | Admitting: *Deleted

## 2017-03-13 LAB — GC/CHLAMYDIA PROBE AMP (~~LOC~~) NOT AT ARMC
CHLAMYDIA, DNA PROBE: NEGATIVE
NEISSERIA GONORRHEA: NEGATIVE

## 2017-03-13 NOTE — Telephone Encounter (Signed)
Received message left on nurse voicemail on 03/12/17 at 1416.  Patient states she needs some information faxed to her dentist office for a referral.  Requests a return call to [phone].

## 2017-03-20 ENCOUNTER — Encounter (HOSPITAL_COMMUNITY): Payer: Self-pay | Admitting: *Deleted

## 2017-03-25 ENCOUNTER — Ambulatory Visit (HOSPITAL_COMMUNITY)
Admission: RE | Admit: 2017-03-25 | Discharge: 2017-03-25 | Disposition: A | Discharge status: Home / Self Care | Payer: BLUE CROSS/BLUE SHIELD | Source: Ambulatory Visit | Attending: Obstetrics & Gynecology | Admitting: Obstetrics & Gynecology

## 2017-03-25 ENCOUNTER — Encounter (HOSPITAL_COMMUNITY): Payer: Self-pay

## 2017-03-25 DIAGNOSIS — Z3A13 13 weeks gestation of pregnancy: Secondary | ICD-10-CM | POA: Diagnosis not present

## 2017-03-25 DIAGNOSIS — Z3403 Encounter for supervision of normal first pregnancy, third trimester: Secondary | ICD-10-CM | POA: Diagnosis not present

## 2017-03-26 ENCOUNTER — Encounter: Payer: Self-pay | Admitting: Family Medicine

## 2017-03-26 DIAGNOSIS — O98819 Other maternal infectious and parasitic diseases complicating pregnancy, unspecified trimester: Secondary | ICD-10-CM

## 2017-03-26 DIAGNOSIS — A749 Chlamydial infection, unspecified: Secondary | ICD-10-CM

## 2017-03-26 HISTORY — DX: Chlamydial infection, unspecified: A74.9

## 2017-03-28 ENCOUNTER — Other Ambulatory Visit: Payer: Self-pay

## 2017-04-08 ENCOUNTER — Ambulatory Visit (INDEPENDENT_AMBULATORY_CARE_PROVIDER_SITE_OTHER): Payer: BLUE CROSS/BLUE SHIELD | Admitting: Family

## 2017-04-08 VITALS — BP 113/63 | HR 87 | Wt 130.0 lb

## 2017-04-08 DIAGNOSIS — Z3A19 19 weeks gestation of pregnancy: Secondary | ICD-10-CM

## 2017-04-08 DIAGNOSIS — Z3402 Encounter for supervision of normal first pregnancy, second trimester: Secondary | ICD-10-CM

## 2017-04-08 DIAGNOSIS — Z363 Encounter for antenatal screening for malformations: Secondary | ICD-10-CM

## 2017-04-08 NOTE — Patient Instructions (Signed)

## 2017-04-08 NOTE — Progress Notes (Signed)
   PRENATAL VISIT NOTE  Subjective:  Kristie Thornton is a 26 y.o. G1P0 at 7475w3d being seen today for ongoing prenatal care.  She is currently monitored for the following issues for this low-risk pregnancy and has Generalized anxiety disorder; Endometriosis; Chlamydia contact; Encounter for supervision of normal first pregnancy in third trimester; and Chlamydia infection affecting pregnancy, antepartum on her problem list.  Patient reports no complaints.  Contractions: Not present. Vag. Bleeding: None.  Movement: Absent. Denies leaking of fluid.   The following portions of the patient's history were reviewed and updated as appropriate: allergies, current medications, past family history, past medical history, past social history, past surgical history and problem list. Problem list updated.  Objective:   Vitals:   04/08/17 1053  BP: 113/63  Pulse: 87  Weight: 130 lb (59 kg)    Fetal Status: Fetal Heart Rate (bpm): 145 Fundal Height: 16 cm Movement: Absent     General:  Alert, oriented and cooperative. Patient is in no acute distress.  Skin: Skin is warm and dry. No rash noted.   Cardiovascular: Normal heart rate noted  Respiratory: Normal respiratory effort, no problems with respiration noted  Abdomen: Soft, gravid, appropriate for gestational age. Pain/Pressure: Absent     Pelvic:  Cervical exam deferred        Extremities: Normal range of motion.  Edema: None  Mental Status: Normal mood and affect. Normal behavior. Normal judgment and thought content.   Assessment and Plan:  Pregnancy: G1P0 at 4475w3d  1. Screening, antenatal, for malformation by ultrasound - US MFM OB COMP + 14 WK; Future  2. Encounter for supervision of normal first pregnancy in second trimester - Reviewed results of NT; need AFP at next visit  Preterm labor symptoms and general obstetric precautions including but not limited to vaginal bleeding, contractions, leaking of fluid and fetal movement were reviewed in  detail with the patient. Please refer to After Visit Summary for other counseling recommendations.  Return in about 4 weeks (around 05/06/2017) for return OB visit.   Rochele PagesWalidah Karim, CNM  04/08/17 12:05 PM

## 2017-05-06 ENCOUNTER — Other Ambulatory Visit: Payer: Self-pay

## 2017-05-06 ENCOUNTER — Ambulatory Visit (INDEPENDENT_AMBULATORY_CARE_PROVIDER_SITE_OTHER): Payer: BLUE CROSS/BLUE SHIELD | Admitting: Advanced Practice Midwife

## 2017-05-06 VITALS — BP 113/65 | HR 74 | Wt 133.0 lb

## 2017-05-06 DIAGNOSIS — Z3490 Encounter for supervision of normal pregnancy, unspecified, unspecified trimester: Secondary | ICD-10-CM

## 2017-05-06 DIAGNOSIS — Z3492 Encounter for supervision of normal pregnancy, unspecified, second trimester: Secondary | ICD-10-CM

## 2017-05-06 DIAGNOSIS — Z3403 Encounter for supervision of normal first pregnancy, third trimester: Secondary | ICD-10-CM

## 2017-05-06 NOTE — Progress Notes (Signed)
Educated pt [place]

## 2017-05-06 NOTE — Patient Instructions (Signed)

## 2017-05-06 NOTE — Progress Notes (Signed)
   PRENATAL VISIT NOTE  Subjective:  Kristie Thornton is a 26 y.o. G1P0 at 463w3d being seen today for ongoing prenatal care.  She is currently monitored for the following issues for this low-risk pregnancy and has Generalized anxiety disorder; Endometriosis; Chlamydia contact; Encounter for supervision of normal first pregnancy in third trimester; and Chlamydia infection affecting pregnancy, antepartum on her problem list.  Patient reports no complaints.  Contractions: Not present. Vag. Bleeding: None.  Movement: Present. Denies leaking of fluid.   The following portions of the patient's history were reviewed and updated as appropriate: allergies, current medications, past family history, past medical history, past social history, past surgical history and problem list. Problem list updated.  Objective:   Vitals:   05/06/17 0943  BP: 113/65  Pulse: 74  Weight: 133 lb (60.3 kg)    Fetal Status: Fetal Heart Rate (bpm): 138 Fundal Height: 20 cm Movement: Present     General:  Alert, oriented and cooperative. Patient is in no acute distress.  Skin: Skin is warm and dry. No rash noted.   Cardiovascular: Normal heart rate noted  Respiratory: Normal respiratory effort, no problems with respiration noted  Abdomen: Soft, gravid, appropriate for gestational age. Pain/Pressure: Absent     Pelvic:  Cervical exam deferred        Extremities: Normal range of motion.  Edema: None  Mental Status: Normal mood and affect. Normal behavior. Normal judgment and thought content.   Assessment and Plan:  Pregnancy: G1P0 at [redacted]w[redacted]d  1. Encounter for supervision of low-risk pregnancy, antepartum  - AFP, Serum, Open Spina Bifida  Preterm labor symptoms and general obstetric precautions including but not limited to vaginal bleeding, contractions, leaking of fluid and fetal movement were reviewed in detail with the patient. Please refer to After Visit Summary for other counseling recommendations.  Return in about 4  weeks (around 06/03/2017).  Signed up for CBE.    Dorathy KinsmanVirginia Sharlette Jansma, CNM

## 2017-05-07 ENCOUNTER — Ambulatory Visit (HOSPITAL_COMMUNITY)
Admission: RE | Admit: 2017-05-07 | Discharge: 2017-05-07 | Disposition: A | Discharge status: Home / Self Care | Payer: BLUE CROSS/BLUE SHIELD | Source: Ambulatory Visit | Attending: Family | Admitting: Family

## 2017-05-07 DIAGNOSIS — O99332 Smoking (tobacco) complicating pregnancy, second trimester: Secondary | ICD-10-CM | POA: Insufficient documentation

## 2017-05-07 DIAGNOSIS — Z363 Encounter for antenatal screening for malformations: Secondary | ICD-10-CM | POA: Insufficient documentation

## 2017-05-07 DIAGNOSIS — Z3A19 19 weeks gestation of pregnancy: Secondary | ICD-10-CM | POA: Diagnosis not present

## 2017-05-10 LAB — AFP, SERUM, OPEN SPINA BIFIDA
AFP MoM: 0.89
AFP Value: 47.1 ng/mL
GEST. AGE ON COLLECTION DATE: 19 wk
Maternal Age At EDD: 26.2 yr
OSBR RISK 1 IN: 10000
TEST RESULTS AFP: NEGATIVE
WEIGHT: 133 [lb_av]

## 2017-06-03 ENCOUNTER — Ambulatory Visit (INDEPENDENT_AMBULATORY_CARE_PROVIDER_SITE_OTHER): Payer: BLUE CROSS/BLUE SHIELD | Admitting: Obstetrics and Gynecology

## 2017-06-03 ENCOUNTER — Encounter: Payer: Self-pay | Admitting: Obstetrics and Gynecology

## 2017-06-03 VITALS — BP 113/63 | HR 106 | Wt 136.1 lb

## 2017-06-03 DIAGNOSIS — Z3403 Encounter for supervision of normal first pregnancy, third trimester: Secondary | ICD-10-CM

## 2017-06-03 DIAGNOSIS — O26899 Other specified pregnancy related conditions, unspecified trimester: Secondary | ICD-10-CM

## 2017-06-03 DIAGNOSIS — O26893 Other specified pregnancy related conditions, third trimester: Secondary | ICD-10-CM

## 2017-06-03 DIAGNOSIS — G5600 Carpal tunnel syndrome, unspecified upper limb: Secondary | ICD-10-CM

## 2017-06-03 HISTORY — DX: Other specified pregnancy related conditions, unspecified trimester: O26.899

## 2017-06-03 NOTE — Progress Notes (Signed)
Subjective:  Kristie Thornton is a 26 y.o. G1P0 at 5578w3d being seen today for ongoing prenatal care.  She is currently monitored for the following issues for this low-risk pregnancy and has Generalized anxiety disorder; Endometriosis; Chlamydia contact; Encounter for supervision of normal first pregnancy in third trimester; and Chlamydia infection affecting pregnancy, antepartum on her problem list.  Patient reports carpal tunnel symptoms.  Contractions: Not present. Vag. Bleeding: None.  Movement: Present. Denies leaking of fluid.   The following portions of the patient's history were reviewed and updated as appropriate: allergies, current medications, past family history, past medical history, past social history, past surgical history and problem list. Problem list updated.  Objective:   Vitals:   06/03/17 1007  BP: 113/63  Pulse: (!) 106  Weight: 136 lb 1.6 oz (61.7 kg)    Fetal Status: Fetal Heart Rate (bpm): 144 Fundal Height: 23 cm Movement: Present     General:  Alert, oriented and cooperative. Patient is in no acute distress.  Skin: Skin is warm and dry. No rash noted.   Cardiovascular: Normal heart rate noted  Respiratory: Normal respiratory effort, no problems with respiration noted  Abdomen: Soft, gravid, appropriate for gestational age. Pain/Pressure: Absent     Pelvic: Vag. Bleeding: None Vag D/C Character: Watery   Cervical exam deferred        Extremities: Normal range of motion.  Edema: Trace  Mental Status: Normal mood and affect. Normal behavior. Normal judgment and thought content.    Assessment and Plan:  Pregnancy: G1P0 at 6778w3d  1. Encounter for supervision of normal first pregnancy in third trimester Continue routine prenatal care. Patient wants waterbirth delivery. Information given. Plan for 28wk labs and GTT at next visit.   2. Carpal tunnel syndrome during pregnancy Symptoms consistent with carpal tunnel syndrome. Conservative measures discussed.   Preterm  labor symptoms and general obstetric precautions including but not limited to vaginal bleeding, contractions, leaking of fluid and fetal movement were reviewed in detail with the patient. Please refer to After Visit Summary for other counseling recommendations.  Return in about 4 weeks (around 07/01/2017) for ob visit.   Caryl AdaJazma Phelps, DO OB Fellow Faculty Practice, [facility] 06/03/2017, 10:32 AM

## 2017-06-03 NOTE — Patient Instructions (Addendum)
Thinking About Kristie Thornton???  You must attend a Kristie Thornton class at [facility]  3rd Wednesday of every month from 7-9pm  Free  [employer] by calling [phone] or online at [email or web address]  Bring [place] the certificate from the class  Kristie Thornton supplies needed for [facility] Department patients:  Our practice has a Heritage manager in a Box tub at the hospital that you can borrow  You will need to purchase an accessory kit that has all needed supplies through [facility] [phone]) or online $175.00  Or you can purchase the supplies separately: o Single-use disposable tub liner for Birth Pool in a Box (REGULAR size) o New garden hose labeled "lead-free", "suitable for drinking water", o Electric drain pump to remove water (We recommend 792 gallon per hour or greater pump.)  o  "non-toxic" OR "water potable" o Garden hose to remove the dirty water o Fish net o Bathing suit top (optional) o Long-handled mirror (optional)  [employer] sells tubs for ~ $120 if you would rather purchase your own tub.  They also sell accessories, liners.    Www.waterbirthsolutions.com for tub purchases and supplies  The Labor Ladies (www.thelaborladies.com) $275 for tub rental/set-up & take down/kit   [employer] information regarding doulas (labor support) who provide pool rentals:  IdentityList.se.htm   The Labor Ladies (www.thelaborladies.com)  IdentityList.se.htm   Things that would prevent you from having a waterbirth:  Premature, <37wks  Previous cesarean birth  Presence of thick meconium-stained fluid  Multiple gestation (Twins, triplets, etc.)  Uncontrolled diabetes or gestational diabetes requiring medication  Hypertension  Heavy vaginal bleeding  Non-reassuring fetal heart rate  Active infection (MRSA, etc.)  If your labor has to be induced and induction method requires continuous  monitoring of the baby's heart rate  Other risks/issues identified by your obstetrical provider  Considering Kristie Thornton? Guide for patients at [facility] [employer]  Why consider waterbirth?  . Gentle birth for babies . Less pain medicine used in labor . May allow for passive descent/less pushing . May reduce perineal tears  . More mobility and instinctive maternal position changes . Increased maternal relaxation . Reduced blood pressure in labor  Is waterbirth safe? What are the risks of infection, drowning or other complications?  . Infection: o Very low risk (3.7 % for tub vs 4.8% for bed) o 7 in 8000 waterbirths with documented infection o Poorly cleaned equipment most common cause o Slightly lower group B strep transmission rate  . Drowning o Maternal:  - Very low risk   - Related to seizures or fainting o Newborn:  - Very low risk. No evidence of increased risk of respiratory problems in multiple large studies - Physiological protection from breathing under water - Avoid underwater birth if there are any fetal complications - Once baby's head is out of the water, keep it out.  . Birth complication o Some reports of cord trauma, but risk decreased by bringing baby to surface gradually o No evidence of increased risk of shoulder dystocia. Mothers can usually change positions faster in water than in a bed, possibly aiding the maneuvers to free the shoulder.   Am I a candidate for waterbirth?  Yes, if you are: . Full-term (37 weeks or greater)  . Have had an uncomplicated pregnancy and labor  No, if you have: Marland Kitchen Preterm birth less than 37 weeks . Thick, particulate meconium stained fluid . Maternal fever over 101 . Heavy bleeding or signs of placental abruption . Pre-eclampsia  .  Any abnormal fetal heart rate pattern . Breech presentation . Twins  . Very large baby . Active communicable infection (this does NOT include group B strep) . Significant  limitation to mobility  Please remember that birth is unpredictable. Under certain unforeseeable circumstances your provider may advise against giving birth in the tub. These decisions will be made on a case-by-case basis and with the safety of you and your baby as our highest priority.

## 2017-07-02 ENCOUNTER — Ambulatory Visit (INDEPENDENT_AMBULATORY_CARE_PROVIDER_SITE_OTHER): Payer: BLUE CROSS/BLUE SHIELD | Admitting: Advanced Practice Midwife

## 2017-07-02 VITALS — BP 114/70 | HR 83 | Wt 141.6 lb

## 2017-07-02 DIAGNOSIS — Z23 Encounter for immunization: Secondary | ICD-10-CM

## 2017-07-02 DIAGNOSIS — Z3403 Encounter for supervision of normal first pregnancy, third trimester: Secondary | ICD-10-CM

## 2017-07-02 MED ORDER — TETANUS-DIPHTH-ACELL PERTUSSIS 5-2.5-18.5 LF-MCG/0.5 IM SUSP
0.5000 mL | Freq: Once | INTRAMUSCULAR | Status: AC
  Administered 2017-07-02: 0.5 mL via INTRAMUSCULAR

## 2017-07-02 NOTE — Progress Notes (Signed)
Just ate some food- discussed need to do 2 hr gtt this week- will make an appointment for lab only at check out today. Pain in lower back.

## 2017-07-02 NOTE — Patient Instructions (Signed)

## 2017-07-02 NOTE — Progress Notes (Signed)
   PRENATAL VISIT NOTE  Subjective:  Kristie Thornton is a 26 y.o. G1P0 at 7110w4d being seen today for ongoing prenatal care.  She is currently monitored for the following issues for this low-risk pregnancy and has Generalized anxiety disorder; Endometriosis; Chlamydia contact; Encounter for supervision of normal first pregnancy in third trimester; Chlamydia infection affecting pregnancy, antepartum; and Carpal tunnel syndrome during pregnancy on her problem list.  Patient reports backache.  Contractions: Irregular. Vag. Bleeding: None.  Movement: Present. Denies leaking of fluid.   The following portions of the patient's history were reviewed and updated as appropriate: allergies, current medications, past family history, past medical history, past social history, past surgical history and problem list. Problem list updated.  Objective:   Vitals:   07/02/17 1128  BP: 114/70  Pulse: 83  Weight: 141 lb 9.6 oz (64.2 kg)    Fetal Status: Fetal Heart Rate (bpm): 140 Fundal Height: 28 cm Movement: Present     General:  Alert, oriented and cooperative. Patient is in no acute distress.  Skin: Skin is warm and dry. No rash noted.   Cardiovascular: Normal heart rate noted  Respiratory: Normal respiratory effort, no problems with respiration noted  Abdomen: Soft, gravid, appropriate for gestational age.  Pain/Pressure: Present     Pelvic: Cervical exam deferred        Extremities: Normal range of motion.  Edema: Trace  Mental Status:  Normal mood and affect. Normal behavior. Normal judgment and thought content.   Assessment and Plan:  Pregnancy: G1P0 at 6110w4d  1. Encounter for supervision of normal first pregnancy in third trimester  - Tdap (BOOSTRIX) injection 0.5 mL; Inject 0.5 mLs into the muscle once.  Preterm labor symptoms and general obstetric precautions including but not limited to vaginal bleeding, contractions, leaking of fluid and fetal movement were reviewed in detail with the  patient. Please refer to After Visit Summary for other counseling recommendations.  Return in about 2 weeks (around 07/16/2017) for ROB.  GTT ASAP  Dorathy KinsmanVirginia Apollo Timothy, CNM

## 2017-07-03 ENCOUNTER — Encounter (HOSPITAL_COMMUNITY): Payer: Self-pay | Admitting: *Deleted

## 2017-07-03 ENCOUNTER — Inpatient Hospital Stay (HOSPITAL_COMMUNITY)
Admission: AD | Admit: 2017-07-03 | Discharge: 2017-07-03 | Disposition: A | Discharge status: Home / Self Care | Payer: BLUE CROSS/BLUE SHIELD | Source: Ambulatory Visit | Attending: Obstetrics and Gynecology | Admitting: Obstetrics and Gynecology

## 2017-07-03 DIAGNOSIS — A5602 Chlamydial vulvovaginitis: Secondary | ICD-10-CM | POA: Insufficient documentation

## 2017-07-03 DIAGNOSIS — M545 Low back pain: Secondary | ICD-10-CM | POA: Diagnosis present

## 2017-07-03 DIAGNOSIS — Z3A27 27 weeks gestation of pregnancy: Secondary | ICD-10-CM | POA: Insufficient documentation

## 2017-07-03 DIAGNOSIS — M549 Dorsalgia, unspecified: Secondary | ICD-10-CM

## 2017-07-03 DIAGNOSIS — O26893 Other specified pregnancy related conditions, third trimester: Secondary | ICD-10-CM | POA: Diagnosis not present

## 2017-07-03 DIAGNOSIS — Z87891 Personal history of nicotine dependence: Secondary | ICD-10-CM | POA: Insufficient documentation

## 2017-07-03 DIAGNOSIS — O9989 Other specified diseases and conditions complicating pregnancy, childbirth and the puerperium: Secondary | ICD-10-CM

## 2017-07-03 DIAGNOSIS — O98312 Other infections with a predominantly sexual mode of transmission complicating pregnancy, second trimester: Secondary | ICD-10-CM | POA: Diagnosis not present

## 2017-07-03 DIAGNOSIS — O98819 Other maternal infectious and parasitic diseases complicating pregnancy, unspecified trimester: Secondary | ICD-10-CM

## 2017-07-03 DIAGNOSIS — A749 Chlamydial infection, unspecified: Secondary | ICD-10-CM

## 2017-07-03 LAB — URINALYSIS, ROUTINE W REFLEX MICROSCOPIC
Bilirubin Urine: NEGATIVE
GLUCOSE, UA: NEGATIVE mg/dL
HGB URINE DIPSTICK: NEGATIVE
Ketones, ur: NEGATIVE mg/dL
Leukocytes, UA: NEGATIVE
Nitrite: NEGATIVE
Protein, ur: NEGATIVE mg/dL
Specific Gravity, Urine: 1.008 (ref 1.005–1.030)
pH: 7 (ref 5.0–8.0)

## 2017-07-03 NOTE — Discharge Instructions (Signed)

## 2017-07-03 NOTE — MAU Provider Note (Signed)
History     CSN: [id number]  Arrival date and time: 07/03/17 2012   First Provider Initiated Contact with Patient 07/03/17 2123      Chief Complaint  Patient presents with  . Back Pain   Kristie Thornton is a 26 y.o. G1P0 at [redacted]w[redacted]d who presents today with back pain. She states that she has had the pain for about 2 weeks, and she wanted to make sure she didn't have kidney infection. She denies any VB or LOF. She reports normal fetal movement. She denies any contractions.    Back Pain  This is a new problem. Episode onset: 2 weeks ago. The problem occurs intermittently. The problem is unchanged. The pain is present in the lumbar spine. The pain is at a severity of 2/10. Pertinent negatives include no dysuria or fever. Risk factors include pregnancy. She has tried nothing for the symptoms.   Past Medical History:  Diagnosis Date  . Anxiety   . Depression   . History of IBS   . PTSD (post-traumatic stress disorder)   . Suicide and self-inflicted injury [facility])     Past Surgical History:  Procedure Laterality Date  . NO PAST SURGERIES      Family History  Problem Relation Age of Onset  . Cancer Mother        colon cancer  . Cancer Father 40       testicular cancer  . Fibromyalgia Sister   . ADD / ADHD Brother     Social History  Substance Use Topics  . Smoking status: Former Smoker    Types: Cigarettes  . Smokeless tobacco: Never Used     Comment: stopped 02/2017 previously vaped  . Alcohol use No     Comment: stopped 01/2017 occasional    Allergies:  Allergies  Allergen Reactions  . Sertraline Hcl Other (See Comments)    Suicidality    Prescriptions Prior to Admission  Medication Sig Dispense Refill Last Dose  . Prenatal Vit-Fe Fumarate-FA (MULTIVITAMIN-PRENATAL) 27-0.8 MG TABS tablet Take 1 tablet by mouth daily at 12 noon.   Taking    Review of Systems  Constitutional: Negative for chills and fever.  Gastrointestinal: Positive for nausea. Negative for vomiting.   Genitourinary: Positive for frequency. Negative for dysuria, urgency, vaginal bleeding and vaginal discharge.  Musculoskeletal: Positive for back pain.   Physical Exam   Blood pressure 115/68, pulse 93, temperature 99.2 F (37.3 C), temperature source Oral, resp. rate 18, height 5' 0.5" (1.537 m), weight 141 lb (64 kg), last menstrual period 12/12/2016, SpO2 100 %, unknown if currently breastfeeding.  Physical Exam  Nursing note and vitals reviewed. Constitutional: She is oriented to person, place, and time. She appears well-developed and well-nourished. No distress.  HENT:  Head: Normocephalic.  Cardiovascular: Normal rate.   Respiratory: Effort normal.  GI: Soft. There is no tenderness. There is no rebound.  Genitourinary:  Genitourinary Comments:  No CVA tenderness.   Neurological: She is alert and oriented to person, place, and time.  Skin: Skin is warm and dry.  Psychiatric: She has a normal mood and affect.     Results for orders placed or performed during the hospital encounter of 07/03/17 (from the past 24 hour(s))  Urinalysis, Routine w reflex microscopic     Status: None   Collection Time: 07/03/17  8:30 PM  Result Value Ref Range   Color, Urine YELLOW YELLOW   APPearance CLEAR CLEAR   Specific Gravity, Urine 1.008 1.005 - 1.030  pH 7.0 5.0 - 8.0   Glucose, UA NEGATIVE NEGATIVE mg/dL   Hgb urine dipstick NEGATIVE NEGATIVE   Bilirubin Urine NEGATIVE NEGATIVE   Ketones, ur NEGATIVE NEGATIVE mg/dL   Protein, ur NEGATIVE NEGATIVE mg/dL   Nitrite NEGATIVE NEGATIVE   Leukocytes, UA NEGATIVE NEGATIVE   FHT: 145, moderate with 10x10 accels, no decels Toco; No UCs  MAU Course  Procedures  MDM   Assessment and Plan   1. Back pain affecting pregnancy in third trimester   2. Chlamydia infection affecting pregnancy, antepartum   3. [redacted] weeks gestation of pregnancy    DC home Comfort measures reviewed  3rd Trimester precautions  PTL precautions  Fetal kick  counts RX: none  Return to MAU as needed FU with OB as planned  Follow-up Information    [facility]   Specialty:  Obstetrics and Gynecology Contact information: [address] [place] [id number] [phone]           Thressa ShellerHeather Amalee Olsen 07/03/2017, 9:25 PM

## 2017-07-03 NOTE — MAU Note (Signed)
Pt reports sharp pains in her lower back off/on for one week, lower abd pain for the last few days, urinary frequency, and only voiding small amounts.

## 2017-07-09 ENCOUNTER — Other Ambulatory Visit: Payer: BLUE CROSS/BLUE SHIELD

## 2017-07-09 DIAGNOSIS — Z3403 Encounter for supervision of normal first pregnancy, third trimester: Secondary | ICD-10-CM

## 2017-07-10 LAB — GLUCOSE TOLERANCE, 2 HOURS W/ 1HR
GLUCOSE, 1 HOUR: 144 mg/dL (ref 65–179)
GLUCOSE, 2 HOUR: 142 mg/dL (ref 65–152)
GLUCOSE, FASTING: 81 mg/dL (ref 65–91)

## 2017-07-10 LAB — CBC
HEMOGLOBIN: 11.5 g/dL (ref 11.1–15.9)
Hematocrit: 34.3 % (ref 34.0–46.6)
MCH: 33.2 pg — ABNORMAL HIGH (ref 26.6–33.0)
MCHC: 33.5 g/dL (ref 31.5–35.7)
MCV: 99 fL — ABNORMAL HIGH (ref 79–97)
Platelets: 249 10*3/uL (ref 150–379)
RBC: 3.46 x10E6/uL — ABNORMAL LOW (ref 3.77–5.28)
RDW: 13.7 % (ref 12.3–15.4)
WBC: 11.6 10*3/uL — ABNORMAL HIGH (ref 3.4–10.8)

## 2017-07-10 LAB — RPR: RPR: NONREACTIVE

## 2017-07-10 LAB — HIV ANTIBODY (ROUTINE TESTING W REFLEX): HIV SCREEN 4TH GENERATION: NONREACTIVE

## 2017-07-16 ENCOUNTER — Ambulatory Visit (INDEPENDENT_AMBULATORY_CARE_PROVIDER_SITE_OTHER): Payer: BLUE CROSS/BLUE SHIELD | Admitting: Obstetrics and Gynecology

## 2017-07-16 VITALS — BP 119/68 | HR 91 | Wt 144.2 lb

## 2017-07-16 DIAGNOSIS — Z3403 Encounter for supervision of normal first pregnancy, third trimester: Secondary | ICD-10-CM

## 2017-07-16 DIAGNOSIS — R21 Rash and other nonspecific skin eruption: Secondary | ICD-10-CM

## 2017-07-16 DIAGNOSIS — A749 Chlamydial infection, unspecified: Secondary | ICD-10-CM

## 2017-07-16 DIAGNOSIS — O98819 Other maternal infectious and parasitic diseases complicating pregnancy, unspecified trimester: Secondary | ICD-10-CM

## 2017-07-16 MED ORDER — TRIAMCINOLONE ACETONIDE 0.025 % EX OINT: 1.0000 "application " | TOPICAL_OINTMENT | Freq: Two times a day (BID) | CUTANEOUS | 1 refills | Status: DC

## 2017-07-16 NOTE — Progress Notes (Signed)
Subjective:  Kristie Thornton is a 26 y.o. G1P0 at [redacted]w[redacted]d being seen today for ongoing prenatal care.  She is currently monitored for the following issues for this low-risk pregnancy and has Generalized anxiety disorder; Endometriosis; Encounter for supervision of normal first pregnancy in third trimester; Chlamydia infection affecting pregnancy, antepartum; and Carpal tunnel syndrome during pregnancy on her problem list.  Patient reports rash. Rash has been present for about a year. It is worsening. Occasionally itches. Followed with her PCP in January for rash was gien antifungal cream but did not help. Has not followed up since. Contractions: Not present. Vag. Bleeding: None.  Movement: (!) Decreased. Denies leaking of fluid.   The following portions of the patient's history were reviewed and updated as appropriate: allergies, current medications, past family history, past medical history, past social history, past surgical history and problem list. Problem list updated.  Objective:   Vitals:   07/16/17 0907  BP: 119/68  Pulse: 91  Weight: 65.4 kg (144 lb 3.2 oz)    Fetal Status: Fetal Heart Rate (bpm): 145 Fundal Height: 29 cm Movement: (!) Decreased     General:  Alert, oriented and cooperative. Patient is in no acute distress.  Skin: Skin is warm and dry. No rash noted.   Cardiovascular: Normal heart rate noted  Respiratory: Normal respiratory effort, no problems with respiration noted  Abdomen: Soft, gravid, appropriate for gestational age. Pain/Pressure: Absent     Pelvic: Vag. Bleeding: None     Cervical exam deferred        Extremities: Normal range of motion.  Edema: None  Mental Status: Normal mood and affect. Normal behavior. Normal judgment and thought content.   Urinalysis:      Assessment and Plan:  Pregnancy: G1P0 at [redacted]w[redacted]d  1. Encounter for supervision of normal first pregnancy in third trimester Continue routine care. Low-risk pregnancy. 28wk labs normal. Patient taking a  water birth class today and still interested. Actuve fetal movement on exam. Discussed kick counts.   2. Chlamydia infection affecting pregnancy, antepartum Wants to be retested despite not having symptoms. Will collect urine to test. Positive result in April with TOC negative in May.  - GC/Chlamydia Probe Amp  3. Rash and nonspecific skin eruption Now chronic rash. Unknown etiology. Skin scraping at PCP office negative for KOH. Will try steroid creme since antifungal did not work. Patient encouraged to follow-up with PCP for possible biopsy.   Preterm labor symptoms and general obstetric precautions including but not limited to vaginal bleeding, contractions, leaking of fluid and fetal movement were reviewed in detail with the patient. Please refer to After Visit Summary for other counseling recommendations.  Return in about 2 weeks (around 07/30/2017) for ob visit.  Caryl Ada, DO OB Fellow

## 2017-07-16 NOTE — Progress Notes (Signed)
Rash to neck/right shoulder. Pt says it is getting bigger.

## 2017-07-16 NOTE — Addendum Note (Signed)
Addended by: Garret Reddish on: 07/16/2017 10:02 AM   Modules accepted: Orders

## 2017-07-17 LAB — CERVICOVAGINAL ANCILLARY ONLY
CHLAMYDIA, DNA PROBE: NEGATIVE
Neisseria Gonorrhea: NEGATIVE

## 2017-07-30 ENCOUNTER — Ambulatory Visit (INDEPENDENT_AMBULATORY_CARE_PROVIDER_SITE_OTHER): Admitting: Family Medicine

## 2017-07-30 VITALS — BP 109/72 | HR 96 | Wt 143.3 lb

## 2017-07-30 DIAGNOSIS — Z3403 Encounter for supervision of normal first pregnancy, third trimester: Secondary | ICD-10-CM

## 2017-07-30 DIAGNOSIS — O26893 Other specified pregnancy related conditions, third trimester: Secondary | ICD-10-CM | POA: Diagnosis not present

## 2017-07-30 DIAGNOSIS — Z113 Encounter for screening for infections with a predominantly sexual mode of transmission: Secondary | ICD-10-CM | POA: Diagnosis not present

## 2017-07-30 DIAGNOSIS — N898 Other specified noninflammatory disorders of vagina: Secondary | ICD-10-CM

## 2017-07-30 DIAGNOSIS — R197 Diarrhea, unspecified: Secondary | ICD-10-CM

## 2017-07-30 NOTE — Progress Notes (Signed)
   PRENATAL VISIT NOTE  Subjective:  Kristie Thornton is a 26 y.o. G1P0 at [redacted]w[redacted]d being seen today for ongoing prenatal care.  She is currently monitored for the following issues for this low-risk pregnancy and has Generalized anxiety disorder; Endometriosis; Encounter for supervision of normal first pregnancy in third trimester; Chlamydia infection affecting pregnancy, antepartum; and Carpal tunnel syndrome during pregnancy on her problem list.  Patient reports increased "milky" vaginal discharge x few days. Also reports watery diarrhea x4 days. Denies abdominal pain or vomiting or nausea.   Patient reports: Contractions: Irregular. Vag. Bleeding: None.  Movement: Present. Denies leaking of fluid.   The following portions of the patient's history were reviewed and updated as appropriate: allergies, current medications, past family history, past medical history, past social history, past surgical history and problem list. Problem list updated.  Objective:   Vitals:   07/30/17 0914  BP: 109/72  Pulse: 96  Weight: 143 lb 4.8 oz (65 kg)    Fetal Status: Fetal Heart Rate (bpm): 135 Fundal Height: 29 cm Movement: Present     General:  Alert, oriented and cooperative. Patient is in no acute distress.  Skin: Skin is warm and dry. No rash noted.   Cardiovascular: Normal heart rate noted  Respiratory: Normal respiratory effort, no problems with respiration noted  Abdomen: Soft, gravid, appropriate for gestational age.  Pain/Pressure: Present     Pelvic: white discharge noted. cervix is visualized and closed         Extremities: Normal range of motion.  Edema: None  Mental Status:  Normal mood and affect. Normal behavior. Normal judgment and thought content.   Assessment and Plan:  Pregnancy: G1P0 at [redacted]w[redacted]d  1. Encounter for supervision of normal first pregnancy in third trimester Routine care.  2. Vaginal discharge - Cervicovaginal ancillary only  3. Diarrhea, unspecified type - uncertain  etiology. Patient appears well hydrated. Continue po hydration. - patient declines stool testing today.  Preterm labor symptoms and general obstetric precautions including but not limited to vaginal bleeding, contractions, leaking of fluid and fetal movement were reviewed in detail with the patient. Please refer to After Visit Summary for other counseling recommendations.  Return in about 2 weeks (around 08/13/2017).   Rolm Bookbinder, DO

## 2017-07-30 NOTE — Patient Instructions (Signed)

## 2017-08-06 LAB — CERVICOVAGINAL ANCILLARY ONLY
Bacterial vaginitis: NEGATIVE
Chlamydia: NEGATIVE
Neisseria Gonorrhea: NEGATIVE
Trichomonas: NEGATIVE

## 2017-08-13 ENCOUNTER — Ambulatory Visit (INDEPENDENT_AMBULATORY_CARE_PROVIDER_SITE_OTHER): Payer: Self-pay | Admitting: Student

## 2017-08-13 DIAGNOSIS — Z3403 Encounter for supervision of normal first pregnancy, third trimester: Secondary | ICD-10-CM

## 2017-08-13 NOTE — Patient Instructions (Signed)
Hormonal Contraception Information °Hormonal contraception is a type of birth control that uses hormones to prevent pregnancy. It usually involves a combination of the hormones estrogen and progesterone or only the hormone progesterone. Hormonal contraception works in these ways: °· It thickens the mucus in the cervix, making it harder for sperm to enter the uterus. °· It changes the lining of the uterus, making it harder for an egg to implant. °· It may stop the ovaries from releasing eggs (ovulation). Some women who take hormonal contraceptives that contain only progesterone may continue to ovulate. ° °Hormonal contraception cannot prevent sexually transmitted infections (STIs). Pregnancy may still occur. °Estrogen and progesterone contraceptives °Contraceptives that use a combination of estrogen and progesterone are available in these forms: °· Pill. Pills come in different combinations of hormones. They must be taken at the same time each day. Pills can affect your period, causing you to get your period once every three months or not at all. °· Patch. The patch must be worn on the lower abdomen for three weeks and then removed on the fourth. °· Vaginal ring. The ring is placed in the vagina and left there for three weeks. It is then removed for one week. ° °Progesterone contraceptives °Contraceptives that use progesterone only are available in these forms: °· Pill. Pills should be taken every day of the cycle. °· Intrauterine device (IUD). This device is inserted into the uterus and removed or replaced every five years or sooner. °· Implant. Plastic rods are placed under the skin of the upper arm. They are removed or replaced every three years or sooner. °· Injection. The injection is given once every 90 days. ° °What are the side effects? °The side effects of estrogen and progesterone contraceptives include: °· Nausea. °· Headaches. °· Breast tenderness. °· Bleeding or spotting between menstrual cycles. °· High  blood pressure (rare). °· Strokes, heart attacks, or blood clots (rare) ° °Side effects of progesterone-only contraceptives include: °· Nausea. °· Headaches. °· Breast tenderness. °· Unpredictable menstrual bleeding. °· High blood pressure (rare). ° °Talk to your health care provider about what side effects may affect you. °Where to find more information: °· Ask your health care provider for more information and resources about hormonal contraception. °· [place] Department of Health and Human Services Office on Women's Health: www.womenshealth.gov °Questions to ask: °· What type of hormonal contraception is right for me? °· How long should I plan to use hormonal contraception? °· What are the side effects of the hormonal contraception method I choose? °· How can I prevent STIs while using hormonal contraception? °Contact a health care provider if: °· You start taking hormonal contraceptives and you develop persistent or severe side effects. °Summary °· Estrogen and progesterone are hormones used in many forms of birth control. °· Talk to your health care provider about what side effects may affect you. °· Hormonal contraception cannot prevent sexually transmitted infections (STIs). °· Ask your health care provider for more information and resources about hormonal contraception. °This information is not intended to replace advice given to you by your health care provider. Make sure you discuss any questions you have with your health care provider. °Document Released: 11/03/2007 Document Revised: 09/13/2016 Document Reviewed: 09/13/2016 °Elsevier Interactive Patient Education © 2018 Elsevier Inc. ° °

## 2017-08-13 NOTE — Progress Notes (Signed)
   PRENATAL VISIT NOTE  Subjective:  Kristie Thornton is a 26 y.o. G1P0 at 5864w4d being seen today for ongoing prenatal care.  She is currently monitored for the following issues for this low-risk pregnancy and has Generalized anxiety disorder; Endometriosis; Encounter for supervision of normal first pregnancy in third trimester; Chlamydia infection affecting pregnancy, antepartum; and Carpal tunnel syndrome during pregnancy on her problem list.  Patient reports no complaints.  Contractions: Irregular. Vag. Bleeding: None.  Movement: Present. Denies leaking of fluid.   The following portions of the patient's history were reviewed and updated as appropriate: allergies, current medications, past family history, past medical history, past social history, past surgical history and problem list. Problem list updated.  Objective:   Vitals:   08/13/17 1411  BP: 124/81  Pulse: (!) 108  Weight: 146 lb 8 oz (66.5 kg)    Fetal Status: Fetal Heart Rate (bpm): 163 Fundal Height: 33 cm Movement: Present     General:  Alert, oriented and cooperative. Patient is in no acute distress.  Skin: Skin is warm and dry. No rash noted.   Cardiovascular: Normal heart rate noted  Respiratory: Normal respiratory effort, no problems with respiration noted  Abdomen: Soft, gravid, appropriate for gestational age.  Pain/Pressure: Absent     Pelvic: Cervical exam deferred        Extremities: Normal range of motion.  Edema: None  Mental Status:  Normal mood and affect. Normal behavior. Normal judgment and thought content.   Assessment and Plan:  Pregnancy: G1P0 at 8564w4d  1. Encounter for supervision of normal first pregnancy in third trimester Patient doing well. Patient says that she wants an US to show her husband who is in the army. I explained that we do not do US unless medically necessary. Patient verbalized understanding.   Preterm labor symptoms and general obstetric precautions including but not limited to  vaginal bleeding, contractions, leaking of fluid and fetal movement were reviewed in detail with the patient. Please refer to After Visit Summary for other counseling recommendations.  Return in about 2 weeks (around 08/27/2017).   Marylene LandKathryn Lorraine Landen Knoedler, CNM

## 2017-08-27 ENCOUNTER — Ambulatory Visit (INDEPENDENT_AMBULATORY_CARE_PROVIDER_SITE_OTHER): Payer: Self-pay | Admitting: Family Medicine

## 2017-08-27 VITALS — BP 134/87 | HR 96 | Wt 148.1 lb

## 2017-08-27 DIAGNOSIS — Z3403 Encounter for supervision of normal first pregnancy, third trimester: Secondary | ICD-10-CM

## 2017-08-27 NOTE — Progress Notes (Signed)
   PRENATAL VISIT NOTE  Subjective:  Kristie Thornton is a 26 y.o. G1P0 at 5861w4d being seen today for ongoing prenatal care.  She is currently monitored for the following issues for this low-risk pregnancy and has Generalized anxiety disorder; Endometriosis; Encounter for supervision of normal first pregnancy in third trimester; Chlamydia infection affecting pregnancy, antepartum; and Carpal tunnel syndrome during pregnancy on her problem list.  Patient reports headache at night x3 days. Wakes up without headache. Drinking water improves headache. No changes in vision, RUQ pain..  Contractions: Irritability.  .  Movement: Present. Denies leaking of fluid.   The following portions of the patient's history were reviewed and updated as appropriate: allergies, current medications, past family history, past medical history, past social history, past surgical history and problem list. Problem list updated.  Objective:   Vitals:   08/27/17 1435  BP: 134/87  Pulse: 96  Weight: 148 lb 1 oz (67.2 kg)    Fetal Status: Fetal Heart Rate (bpm): 145   Movement: Present     General:  Alert, oriented and cooperative. Patient is in no acute distress.  Skin: Skin is warm and dry. No rash noted.   Cardiovascular: Normal heart rate noted  Respiratory: Normal respiratory effort, no problems with respiration noted  Abdomen: Soft, gravid, appropriate for gestational age.  Pain/Pressure: Present     Pelvic: Cervical exam deferred        Extremities: Normal range of motion.  Edema: None  Mental Status:  Normal mood and affect. Normal behavior. Normal judgment and thought content.   Assessment and Plan:  Pregnancy: G1P0 at 2961w4d   1. Encounter for supervision of normal first pregnancy in third trimester FHT wnd FH normal. Tylenol for headache. Doubtful this is part of preeclampsia. Reviewed preeclampsia si/sxs and when to go to hospital.  Preterm labor symptoms and general obstetric precautions including but not  limited to vaginal bleeding, contractions, leaking of fluid and fetal movement were reviewed in detail with the patient. Please refer to After Visit Summary for other counseling recommendations.  No Follow-up on file.   Levie HeritageJacob J Mar Walmer, DO

## 2017-08-27 NOTE — Progress Notes (Signed)
Patient has been having headache's  @ night before bed. And they are the worse. Al   so having a lot of cramping.

## 2017-09-03 ENCOUNTER — Ambulatory Visit (INDEPENDENT_AMBULATORY_CARE_PROVIDER_SITE_OTHER): Admitting: Obstetrics and Gynecology

## 2017-09-03 VITALS — BP 131/74 | HR 104 | Wt 150.8 lb

## 2017-09-03 DIAGNOSIS — Z3403 Encounter for supervision of normal first pregnancy, third trimester: Secondary | ICD-10-CM

## 2017-09-03 DIAGNOSIS — A749 Chlamydial infection, unspecified: Secondary | ICD-10-CM

## 2017-09-03 DIAGNOSIS — O98813 Other maternal infectious and parasitic diseases complicating pregnancy, third trimester: Secondary | ICD-10-CM

## 2017-09-03 DIAGNOSIS — Z113 Encounter for screening for infections with a predominantly sexual mode of transmission: Secondary | ICD-10-CM

## 2017-09-03 DIAGNOSIS — O98819 Other maternal infectious and parasitic diseases complicating pregnancy, unspecified trimester: Principal | ICD-10-CM

## 2017-09-03 LAB — OB RESULTS CONSOLE GBS: STREP GROUP B AG: POSITIVE

## 2017-09-03 NOTE — Progress Notes (Signed)
Prenatal Visit Note Date: 09/03/2017 Clinic: [facility]  Subjective:  Kristie Thornton is a 26 y.o. G1P0 at 9034w4d being seen today for ongoing prenatal care.  She is currently monitored for the following issues for this low-risk pregnancy and has Generalized anxiety disorder; Endometriosis; Encounter for supervision of normal first pregnancy in third trimester; Chlamydia infection affecting pregnancy, antepartum; and Carpal tunnel syndrome during pregnancy on their problem list.  Patient reports occasional HAs.   Contractions: Irregular. Vag. Bleeding: None.  Movement: Present. Denies leaking of fluid.   The following portions of the patient's history were reviewed and updated as appropriate: allergies, current medications, past family history, past medical history, past social history, past surgical history and problem list. Problem list updated.  Objective:   Vitals:   09/03/17 1416  BP: 131/74  Pulse: (!) 104  Weight: 150 lb 12.8 oz (68.4 kg)    Fetal Status: Fetal Heart Rate (bpm): 141 Fundal Height: 37 cm Movement: Present  Presentation: Vertex  General:  Alert, oriented and cooperative. Patient is in no acute distress.  Skin: Skin is warm and dry. No rash noted.   Cardiovascular: Normal heart rate noted  Respiratory: Normal respiratory effort, no problems with respiration noted  Abdomen: Soft, gravid, appropriate for gestational age. No abdominal/ruq tenderness. Pain/Pressure: Present     Pelvic:  Cervical exam performed Dilation: 1 Effacement (%): 50 Station: -2  Extremities: Normal range of motion.  Edema: None  Mental Status: Normal mood and affect. Normal behavior. Normal judgment and thought content.   Urinalysis:      Assessment and Plan:  Pregnancy: G1P0 at 6634w4d  1. Chlamydia infection affecting pregnancy, antepartum - Strep Gp B NAA - Cervicovaginal ancillary only  2. Encounter for supervision of normal first pregnancy in third trimester D/w pt  re: BC nv. Pre-x precautions given.  - Strep Gp B NAA - Cervicovaginal ancillary only  Preterm labor symptoms and general obstetric precautions including but not limited to vaginal bleeding, contractions, leaking of fluid and fetal movement were reviewed in detail with the patient. Please refer to After Visit Summary for other counseling recommendations.  Return in about 1 week (around 09/10/2017) for rob.   Finley BingPickens, Amiliah Campisi, MD

## 2017-09-04 LAB — CERVICOVAGINAL ANCILLARY ONLY
CHLAMYDIA, DNA PROBE: NEGATIVE
Neisseria Gonorrhea: NEGATIVE
TRICH (WINDOWPATH): NEGATIVE

## 2017-09-05 LAB — STREP GP B NAA: STREP GROUP B AG: POSITIVE — AB

## 2017-09-06 ENCOUNTER — Encounter: Payer: Self-pay | Admitting: Obstetrics and Gynecology

## 2017-09-06 DIAGNOSIS — O99820 Streptococcus B carrier state complicating pregnancy: Secondary | ICD-10-CM | POA: Insufficient documentation

## 2017-09-10 ENCOUNTER — Encounter: Payer: Self-pay | Admitting: Advanced Practice Midwife

## 2017-09-10 ENCOUNTER — Ambulatory Visit (INDEPENDENT_AMBULATORY_CARE_PROVIDER_SITE_OTHER): Payer: Self-pay | Admitting: Advanced Practice Midwife

## 2017-09-10 VITALS — BP 147/88 | HR 97 | Wt 154.0 lb

## 2017-09-10 DIAGNOSIS — Z87898 Personal history of other specified conditions: Secondary | ICD-10-CM

## 2017-09-10 DIAGNOSIS — F1191 Opioid use, unspecified, in remission: Secondary | ICD-10-CM | POA: Insufficient documentation

## 2017-09-10 DIAGNOSIS — Z3403 Encounter for supervision of normal first pregnancy, third trimester: Secondary | ICD-10-CM

## 2017-09-10 NOTE — Progress Notes (Signed)
   PRENATAL VISIT NOTE  Subjective:  Kristie Thornton is a 26 y.o. G1P0 at 6264w4d being seen today for ongoing prenatal care.  She is currently monitored for the following issues for this low-risk pregnancy and has Generalized anxiety disorder; Endometriosis; Encounter for supervision of normal first pregnancy in third trimester; Chlamydia infection affecting pregnancy, antepartum; Carpal tunnel syndrome during pregnancy; GBS (group B Streptococcus carrier), +RV culture, currently pregnant; and History of narcotic use on their problem list.  Patient reports occasional contractions.  Contractions: Irritability. Vag. Bleeding: None.  Movement: Absent. Denies leaking of fluid.   The following portions of the patient's history were reviewed and updated as appropriate: allergies, current medications, past family history, past medical history, past social history, past surgical history and problem list. Problem list updated.  Objective:   Vitals:   09/10/17 1517  BP: (!) 147/88  Pulse: 97  Weight: 154 lb (69.9 kg)    Fetal Status: Fetal Heart Rate (bpm): 148   Movement: Absent     General:  Alert, oriented and cooperative. Patient is in no acute distress.  Skin: Skin is warm and dry. No rash noted.   Cardiovascular: Normal heart rate noted  Respiratory: Normal respiratory effort, no problems with respiration noted  Abdomen: Soft, gravid, appropriate for gestational age.  Pain/Pressure: Absent     Pelvic: Cervical exam performed        Extremities: Normal range of motion.  Edema: None  Mental Status:  Normal mood and affect. Normal behavior. Normal judgment and thought content.   Assessment and Plan:  Pregnancy: G1P0 at 7264w4d  1. Encounter for supervision of normal first pregnancy in third trimester      Labor signs reviewed  2. History of narcotic use     Records reviewed. Past use of suboxone.  I was unable to get into database.  Stopped in 2017  Term labor symptoms and general obstetric  precautions including but not limited to vaginal bleeding, contractions, leaking of fluid and fetal movement were reviewed in detail with the patient. Please refer to After Visit Summary for other counseling recommendations.  Return in about 1 week (around 09/17/2017) for [facility].   Wynelle BourgeoisMarie Kyle Luppino, CNM

## 2017-09-10 NOTE — Patient Instructions (Signed)

## 2017-09-10 NOTE — Progress Notes (Signed)
Pt stated wants check up..Marland Kitchen

## 2017-09-13 ENCOUNTER — Encounter: Payer: Self-pay | Admitting: Advanced Practice Midwife

## 2017-09-15 ENCOUNTER — Telehealth: Payer: Self-pay | Admitting: General Practice

## 2017-09-15 NOTE — Telephone Encounter (Signed)
Patient called and left message stating she has a sore throat and wants to know what she can take. Called patient stating I am returning her phone call and to discuss her mychart message. Reviewed approved OTC medications for cold/cough with patient. Patient verbalized understanding. Discussed with patient that we did not prescribe suboxone, but that was noted in her chart as a previously used medication. Patient states she hasn't taken that before and doesn't like that her chart says narcotic use. Patient states she hasn't had a problem with that before and she feels offended that the provider put that in her chart after only spending 20 minutes with her when she waited 90 minutes to be seen. Apologized to patient that she felt offended or stereotyped by that but I can assure her that wasn't the intention. Told patient Hilda LiasMarie is very thorough in going through past medical records and was just updating her chart based off an ER note from January 2017. Patient asked what ER visit that was. Told patient it was an ER visit from January 2017 at [facility]. Patient states she has never [facility] to a [facility]. Told patient the hospital is in [place] and the encounter is dated 11/21/15. Patient states she went to the ER for a UTI and that is all so why is that in her chart. Read ER notes to patient. Patient was very upset and states that was supposed to be private information from the rehab center and they violated her rights releasing that information. Told patient that is where our information was derived from. Told patient that the note made in her chart is mostly in regards to her mental health history not opoid use. Told patient that our concern would be, patient's with a mental health history are more likely to be at risk for postpartum depression and that is what our concern would be so we can make sure she is okay after she has the baby. Told patient I cannot remove that from her chart and she will need to speak  with the provider she sees on Wednesday regarding that if she has more questions. Patient verbalized understanding & had no other questions at this time.

## 2017-09-17 ENCOUNTER — Ambulatory Visit (INDEPENDENT_AMBULATORY_CARE_PROVIDER_SITE_OTHER)

## 2017-09-17 VITALS — BP 136/79 | HR 97 | Wt 156.5 lb

## 2017-09-17 DIAGNOSIS — Z3403 Encounter for supervision of normal first pregnancy, third trimester: Secondary | ICD-10-CM

## 2017-09-17 NOTE — Progress Notes (Signed)
   PRENATAL VISIT NOTE  Subjective:  Kristie Thornton is a 26 y.o. G1P0 at 468w4d being seen today for ongoing prenatal care.  She is currently monitored for the following issues for this low-risk pregnancy and has Generalized anxiety disorder; Endometriosis; Encounter for supervision of normal first pregnancy in third trimester; Chlamydia infection affecting pregnancy, antepartum; Carpal tunnel syndrome during pregnancy; GBS (group B Streptococcus carrier), +RV culture, currently pregnant; and History of narcotic use on their problem list.  Patient reports no complaints.  Contractions: Irregular. Vag. Bleeding: None.  Movement: Present. Denies leaking of fluid.   The following portions of the patient's history were reviewed and updated as appropriate: allergies, current medications, past family history, past medical history, past social history, past surgical history and problem list. Problem list updated.  Objective:   Vitals:   09/17/17 1424  BP: 136/79  Pulse: 97  Weight: 156 lb 8 oz (71 kg)    Fetal Status: Fetal Heart Rate (bpm): 139 Fundal Height: 38 cm Movement: Present     General:  Alert, oriented and cooperative. Patient is in no acute distress.  Skin: Skin is warm and dry. No rash noted.   Cardiovascular: Normal heart rate noted  Respiratory: Normal respiratory effort, no problems with respiration noted  Abdomen: Soft, gravid, appropriate for gestational age.  Pain/Pressure: Present     Pelvic: Cervical exam performed Dilation: 1 Effacement (%): 70 Station: -2  Extremities: Normal range of motion.  Edema: Trace  Mental Status:  Normal mood and affect. Normal behavior. Normal judgment and thought content.   Assessment and Plan:  Pregnancy: G1P0 at [redacted]w[redacted]d  1. Encounter for supervision of normal first pregnancy in third trimester -Discussed with patient concerns of hx of depression and drug use in chart. [place] in [place] abuse. Patient desires to  keep medical hx confidential and did not disclose to providers due to worry of being "labeled." Explained to patient importance of knowing hx for postpartum period and to better take care of her and baby. Encouraged patient to disclose information to care providers going forward.   Term labor symptoms and general obstetric precautions including but not limited to vaginal bleeding, contractions, leaking of fluid and fetal movement were reviewed in detail with the patient. Please refer to After Visit Summary for other counseling recommendations.  Return in about 1 week (around 09/24/2017), or ROB.   Rolm BookbinderCaroline M Sterling Mondo, CNM  09/17/17 3:17 PM

## 2017-09-17 NOTE — Patient Instructions (Signed)
Fetal Movement Counts °Patient Name: ________________________________________________ Patient Due Date: ____________________ °What is a fetal movement count? °A fetal movement count is the number of times that you feel your baby move during a certain amount of time. This may also be called a fetal kick count. A fetal movement count is recommended for every pregnant woman. You may be asked to start counting fetal movements as early as week 28 of your pregnancy. °Pay attention to when your baby is most active. You may notice your baby's sleep and wake cycles. You may also notice things that make your baby move more. You should do a fetal movement count: °· When your baby is normally most active. °· At the same time each day. ° °A good time to count movements is while you are resting, after having something to eat and drink. °How do I count fetal movements? °1. Find a quiet, comfortable area. Sit, or lie down on your side. °2. Write down the date, the start time and stop time, and the number of movements that you felt between those two times. Take this information with you to your health care visits. °3. For 2 hours, count kicks, flutters, swishes, rolls, and jabs. You should feel at least 10 movements during 2 hours. °4. You may stop counting after you have felt 10 movements. °5. If you do not feel 10 movements in 2 hours, have something to eat and drink. Then, keep resting and counting for 1 hour. If you feel at least 4 movements during that hour, you may stop counting. °Contact a health care provider if: °· You feel fewer than 4 movements in 2 hours. °· Your baby is not moving like he or she usually does. °Date: ____________ Start time: ____________ Stop time: ____________ Movements: ____________ °Date: ____________ Start time: ____________ Stop time: ____________ Movements: ____________ °Date: ____________ Start time: ____________ Stop time: ____________ Movements: ____________ °Date: ____________ Start time:  ____________ Stop time: ____________ Movements: ____________ °Date: ____________ Start time: ____________ Stop time: ____________ Movements: ____________ °Date: ____________ Start time: ____________ Stop time: ____________ Movements: ____________ °Date: ____________ Start time: ____________ Stop time: ____________ Movements: ____________ °Date: ____________ Start time: ____________ Stop time: ____________ Movements: ____________ °Date: ____________ Start time: ____________ Stop time: ____________ Movements: ____________ °This information is not intended to replace advice given to you by your health care provider. Make sure you discuss any questions you have with your health care provider. °Document Released: 11/13/2006 Document Revised: 06/12/2016 Document Reviewed: 11/23/2015 °Elsevier Interactive Patient Education © 2018 [employer] °Braxton Hicks Contractions °Contractions of the uterus can occur throughout pregnancy, but they are not always a sign that you are in labor. You may have practice contractions called Braxton Hicks contractions. These false labor contractions are sometimes confused with true labor. °What are Braxton Hicks contractions? °Braxton Hicks contractions are tightening movements that occur in the muscles of the uterus before labor. Unlike true labor contractions, these contractions do not result in opening (dilation) and thinning of the cervix. Toward the end of pregnancy (32-34 weeks), Braxton Hicks contractions can happen more often and may become stronger. These contractions are sometimes difficult to tell apart from true labor because they can be very uncomfortable. You should not feel embarrassed if you go to the hospital with false labor. °Sometimes, the only way to tell if you are in true labor is for your health care provider to look for changes in the cervix. The health care provider will do a physical exam and may monitor your contractions. If   you are not in true labor, the exam  should show that your cervix is not dilating and your water has not broken. °If there are no prenatal problems or other health problems associated with your pregnancy, it is completely safe for you to be sent home with false labor. You may continue to have Braxton Hicks contractions until you go into true labor. °How can I tell the difference between true labor and false labor? °· Differences °? False labor °? Contractions last 30-70 seconds.: Contractions are usually shorter and not as strong as true labor contractions. °? Contractions become very regular.: Contractions are usually irregular. °? Discomfort is usually felt in the top of the uterus, and it spreads to the lower abdomen and low back.: Contractions are often felt in the front of the lower abdomen and in the groin. °? Contractions do not go away with walking.: Contractions may go away when you walk around or change positions while lying down. °? Contractions usually become more intense and increase in frequency.: Contractions get weaker and are shorter-lasting as time goes on. °? The cervix dilates and gets thinner.: The cervix usually does not dilate or become thin. °Follow these instructions at home: °· Take over-the-counter and prescription medicines only as told by your health care provider. °· Keep up with your usual exercises and follow other instructions from your health care provider. °· Eat and drink lightly if you think you are going into labor. °· If Braxton Hicks contractions are making you uncomfortable: °? Change your position from lying down or resting to walking, or change from walking to resting. °? Sit and rest in a tub of warm water. °? Drink enough fluid to keep your urine clear or pale yellow. Dehydration may cause these contractions. °? Do slow and deep breathing several times an hour. °· Keep all follow-up prenatal visits as told by your health care provider. This is important. °Contact a health care provider if: °· You have a  fever. °· You have continuous pain in your abdomen. °Get help right away if: °· Your contractions become stronger, more regular, and closer together. °· You have fluid leaking or gushing from your vagina. °· You pass blood-tinged mucus (bloody show). °· You have bleeding from your vagina. °· You have low back pain that you never had before. °· You feel your baby’s head pushing down and causing pelvic pressure. °· Your baby is not moving inside you as much as it used to. °Summary °· Contractions that occur before labor are called Braxton Hicks contractions, false labor, or practice contractions. °· Braxton Hicks contractions are usually shorter, weaker, farther apart, and less regular than true labor contractions. True labor contractions usually become progressively stronger and regular and they become more frequent. °· Manage discomfort from Braxton Hicks contractions by changing position, resting in a warm bath, drinking plenty of water, or practicing deep breathing. °This information is not intended to replace advice given to you by your health care provider. Make sure you discuss any questions you have with your health care provider. °Document Released: 10/14/2005 Document Revised: 09/02/2016 Document Reviewed: 09/02/2016 °Elsevier Interactive Patient Education © 2017 Elsevier Inc. ° °

## 2017-09-24 ENCOUNTER — Ambulatory Visit (INDEPENDENT_AMBULATORY_CARE_PROVIDER_SITE_OTHER): Admitting: Advanced Practice Midwife

## 2017-09-24 ENCOUNTER — Other Ambulatory Visit: Payer: Self-pay | Admitting: Advanced Practice Midwife

## 2017-09-24 ENCOUNTER — Other Ambulatory Visit: Payer: Self-pay | Admitting: Certified Nurse Midwife

## 2017-09-24 VITALS — BP 132/85 | HR 96 | Wt 155.0 lb

## 2017-09-24 DIAGNOSIS — Z3403 Encounter for supervision of normal first pregnancy, third trimester: Secondary | ICD-10-CM

## 2017-09-24 DIAGNOSIS — O99820 Streptococcus B carrier state complicating pregnancy: Secondary | ICD-10-CM

## 2017-09-24 NOTE — Addendum Note (Signed)
Addended by: Sharyon CableOGERS, Bailee Thall C on: 09/24/2017 12:18 PM   Modules accepted: Kipp BroodSmartSet

## 2017-09-24 NOTE — Progress Notes (Signed)
   PRENATAL VISIT NOTE  Subjective:  Kristie Thornton is a 26 y.o. G1P0 at 247w4d being seen today for ongoing prenatal care.  She is currently monitored for the following issues for this low-risk pregnancy and has Generalized anxiety disorder; Endometriosis; Encounter for supervision of normal first pregnancy in third trimester; Chlamydia infection affecting pregnancy, antepartum; Carpal tunnel syndrome during pregnancy; GBS (group B Streptococcus carrier), +RV culture, currently pregnant; and History of narcotic use on their problem list.  Patient reports no complaints.  Contractions: Irregular. Vag. Bleeding: None.  Movement: Present. Denies leaking of fluid.   The following portions of the patient's history were reviewed and updated as appropriate: allergies, current medications, past family history, past medical history, past social history, past surgical history and problem list. Problem list updated.  Objective:   Vitals:   09/24/17 1136  BP: 132/85  Pulse: 96  Weight: 155 lb (70.3 kg)    Fetal Status: Fetal Heart Rate (bpm): 142 Fundal Height: 37 cm Movement: Present  Presentation: Vertex  General:  Alert, oriented and cooperative. Patient is in no acute distress.  Skin: Skin is warm and dry. No rash noted.   Cardiovascular: Normal heart rate noted  Respiratory: Normal respiratory effort, no problems with respiration noted  Abdomen: Soft, gravid, appropriate for gestational age.  Pain/Pressure: Present     Pelvic: Cervical exam deferred        Extremities: Normal range of motion.  Edema: Trace  Mental Status:  Normal mood and affect. Normal behavior. Normal judgment and thought content.   Assessment and Plan:  Pregnancy: G1P0 at 347w4d  1. Encounter for supervision of normal first pregnancy in third trimester -Discussed post dates planning for NST and BPP early next week -Scheduled for induction on 12/8 @0800  at 41weeks  2. GBS (group B Streptococcus carrier), +RV culture,  currently pregnant -Patient is GBS positive, discussed need to come in to [facility] for evaluation as soon as she believes her water broke.   Term labor symptoms and general obstetric precautions including but not limited to vaginal bleeding, contractions, leaking of fluid and fetal movement were reviewed in detail with the patient. Please refer to After Visit Summary for other counseling recommendations.  Return in about 5 days (around 09/29/2017) for ROB, NST.and BPP.    Sharyon CableVeronica C Sidonia Nutter, CNM

## 2017-09-24 NOTE — Patient Instructions (Signed)

## 2017-09-25 ENCOUNTER — Encounter (HOSPITAL_COMMUNITY): Payer: Self-pay | Admitting: *Deleted

## 2017-09-25 ENCOUNTER — Telehealth (HOSPITAL_COMMUNITY): Payer: Self-pay | Admitting: *Deleted

## 2017-09-25 NOTE — Telephone Encounter (Signed)
Preadmission screen  

## 2017-09-29 ENCOUNTER — Ambulatory Visit: Payer: Self-pay

## 2017-09-29 ENCOUNTER — Ambulatory Visit (INDEPENDENT_AMBULATORY_CARE_PROVIDER_SITE_OTHER): Admitting: *Deleted

## 2017-09-29 ENCOUNTER — Ambulatory Visit (INDEPENDENT_AMBULATORY_CARE_PROVIDER_SITE_OTHER): Admitting: Family Medicine

## 2017-09-29 VITALS — BP 135/84 | HR 98 | Wt 157.0 lb

## 2017-09-29 DIAGNOSIS — O99820 Streptococcus B carrier state complicating pregnancy: Secondary | ICD-10-CM

## 2017-09-29 DIAGNOSIS — O480 Post-term pregnancy: Secondary | ICD-10-CM

## 2017-09-29 DIAGNOSIS — Z3403 Encounter for supervision of normal first pregnancy, third trimester: Secondary | ICD-10-CM

## 2017-09-29 DIAGNOSIS — O409XX0 Polyhydramnios, unspecified trimester, not applicable or unspecified: Secondary | ICD-10-CM

## 2017-09-29 NOTE — Progress Notes (Signed)

## 2017-09-29 NOTE — Progress Notes (Signed)
   PRENATAL VISIT NOTE  Subjective:  Kristie Thornton is a 26 y.o. G1P0 at 7756w2d being seen today for ongoing prenatal care.  She is currently monitored for the following issues for this low-risk pregnancy and has Generalized anxiety disorder; Endometriosis; Encounter for supervision of normal first pregnancy in third trimester; Chlamydia infection affecting pregnancy, antepartum; Carpal tunnel syndrome during pregnancy; GBS (group B Streptococcus carrier), +RV culture, currently pregnant; and History of narcotic use on their problem list.  Patient reports no complaints.  Contractions: Irregular. Vag. Bleeding: None.  Movement: Present. Denies leaking of fluid.   The following portions of the patient's history were reviewed and updated as appropriate: allergies, current medications, past family history, past medical history, past social history, past surgical history and problem list. Problem list updated.  Objective:   Vitals:   09/29/17 1508  BP: 135/84  Pulse: 98  Weight: 157 lb (71.2 kg)    Fetal Status: Fetal Heart Rate (bpm): NST   Movement: Present  Presentation: Vertex  General:  Alert, oriented and cooperative. Patient is in no acute distress.  Skin: Skin is warm and dry. No rash noted.   Cardiovascular: Normal heart rate noted  Respiratory: Normal respiratory effort, no problems with respiration noted  Abdomen: Soft, gravid, appropriate for gestational age.  Pain/Pressure: Present     Pelvic: Cervical exam deferred        Extremities: Normal range of motion.  Edema: Trace  Mental Status:  Normal mood and affect. Normal behavior. Normal judgment and thought content.   Assessment and Plan:  Pregnancy: G1P0 at 4956w2d  1. Encounter for supervision of normal first pregnancy in third trimester FHT and FH normal. Induction scheduled for 41 weeks  2. GBS (group B Streptococcus carrier), +RV culture, currently pregnant Intrapartum prophylaxis  3. Polyhydramnios affecting  pregnancy BPP 10/10. Per new [facility] guidelines, no reason to induce for mild idiopathic poly (AFI 21, but one pocket >8cm).  Term labor symptoms and general obstetric precautions including but not limited to vaginal bleeding, contractions, leaking of fluid and fetal movement were reviewed in detail with the patient. Please refer to After Visit Summary for other counseling recommendations.  No Follow-up on file.   Levie HeritageJacob J Stayce Delancy, DO

## 2017-09-30 ENCOUNTER — Other Ambulatory Visit: Payer: Self-pay | Admitting: Certified Nurse Midwife

## 2017-10-04 ENCOUNTER — Encounter (HOSPITAL_COMMUNITY): Payer: Self-pay

## 2017-10-04 ENCOUNTER — Other Ambulatory Visit: Payer: Self-pay

## 2017-10-04 ENCOUNTER — Inpatient Hospital Stay (HOSPITAL_COMMUNITY)
Admission: RE | Admit: 2017-10-04 | Discharge: 2017-10-08 | DRG: 807 | Disposition: A | Discharge status: Home / Self Care | Source: Ambulatory Visit | Attending: Obstetrics and Gynecology | Admitting: Obstetrics and Gynecology

## 2017-10-04 VITALS — BP 138/86 | HR 81 | Temp 97.9°F | Resp 20 | Ht 60.5 in | Wt 153.6 lb

## 2017-10-04 DIAGNOSIS — O9962 Diseases of the digestive system complicating childbirth: Secondary | ICD-10-CM | POA: Diagnosis present

## 2017-10-04 DIAGNOSIS — O480 Post-term pregnancy: Principal | ICD-10-CM | POA: Diagnosis present

## 2017-10-04 DIAGNOSIS — O134 Gestational [pregnancy-induced] hypertension without significant proteinuria, complicating childbirth: Secondary | ICD-10-CM | POA: Diagnosis present

## 2017-10-04 DIAGNOSIS — Z87891 Personal history of nicotine dependence: Secondary | ICD-10-CM | POA: Diagnosis not present

## 2017-10-04 DIAGNOSIS — O99824 Streptococcus B carrier state complicating childbirth: Secondary | ICD-10-CM | POA: Diagnosis present

## 2017-10-04 DIAGNOSIS — O98819 Other maternal infectious and parasitic diseases complicating pregnancy, unspecified trimester: Secondary | ICD-10-CM

## 2017-10-04 DIAGNOSIS — Z3A41 41 weeks gestation of pregnancy: Secondary | ICD-10-CM | POA: Diagnosis not present

## 2017-10-04 DIAGNOSIS — O139 Gestational [pregnancy-induced] hypertension without significant proteinuria, unspecified trimester: Secondary | ICD-10-CM | POA: Diagnosis not present

## 2017-10-04 DIAGNOSIS — O403XX0 Polyhydramnios, third trimester, not applicable or unspecified: Secondary | ICD-10-CM | POA: Diagnosis present

## 2017-10-04 DIAGNOSIS — A749 Chlamydial infection, unspecified: Secondary | ICD-10-CM

## 2017-10-04 LAB — CBC
HCT: 37.6 % (ref 36.0–46.0)
Hemoglobin: 12.6 g/dL (ref 12.0–15.0)
MCH: 33.6 pg (ref 26.0–34.0)
MCHC: 33.5 g/dL (ref 30.0–36.0)
MCV: 100.3 fL — ABNORMAL HIGH (ref 78.0–100.0)
Platelets: 265 10*3/uL (ref 150–400)
RBC: 3.75 MIL/uL — ABNORMAL LOW (ref 3.87–5.11)
RDW: 13.5 % (ref 11.5–15.5)
WBC: 10.9 10*3/uL — ABNORMAL HIGH (ref 4.0–10.5)

## 2017-10-04 LAB — COMPREHENSIVE METABOLIC PANEL
ALK PHOS: 137 U/L — AB (ref 38–126)
ALT: 10 U/L — AB (ref 14–54)
AST: 16 U/L (ref 15–41)
Albumin: 3.3 g/dL — ABNORMAL LOW (ref 3.5–5.0)
Anion gap: 10 (ref 5–15)
BUN: 7 mg/dL (ref 6–20)
CALCIUM: 8.9 mg/dL (ref 8.9–10.3)
CHLORIDE: 105 mmol/L (ref 101–111)
CO2: 20 mmol/L — AB (ref 22–32)
CREATININE: 0.37 mg/dL — AB (ref 0.44–1.00)
GFR calc Af Amer: >60 mL/min (ref >60)
GFR calc non Af Amer: >60 mL/min (ref >60)
GLUCOSE: 111 mg/dL — AB (ref 65–99)
Potassium: 3.2 mmol/L — ABNORMAL LOW (ref 3.5–5.1)
SODIUM: 135 mmol/L (ref 135–145)
Total Bilirubin: 0.3 mg/dL (ref 0.3–1.2)
Total Protein: 6.7 g/dL (ref 6.5–8.1)

## 2017-10-04 LAB — TYPE AND SCREEN
ABO/RH(D): A POS
Antibody Screen: NEGATIVE

## 2017-10-04 LAB — ABO/RH: ABO/RH(D): A POS

## 2017-10-04 LAB — PROTEIN / CREATININE RATIO, URINE
Creatinine, Urine: 34 mg/dL
Protein Creatinine Ratio: 0.21 mg/mg{Cre} — ABNORMAL HIGH (ref 0.00–0.15)
Total Protein, Urine: 7 mg/dL

## 2017-10-04 MED ORDER — OXYTOCIN 40 UNITS IN LACTATED RINGERS INFUSION - SIMPLE MED
2.5000 [IU]/h | INTRAVENOUS | Status: DC
  Filled 2017-10-04 (×2): qty 1000

## 2017-10-04 MED ORDER — OXYTOCIN BOLUS FROM INFUSION
500.0000 mL | Freq: Once | INTRAVENOUS | Status: AC
  Administered 2017-10-06: 500 mL via INTRAVENOUS

## 2017-10-04 MED ORDER — ACETAMINOPHEN 325 MG PO TABS: 650.0000 mg | ORAL_TABLET | ORAL | Status: DC | PRN

## 2017-10-04 MED ORDER — PENICILLIN G POTASSIUM 5000000 UNITS IJ SOLR
5.0000 10*6.[IU] | Freq: Once | INTRAVENOUS | Status: AC
  Administered 2017-10-04: 5 10*6.[IU] via INTRAVENOUS
  Filled 2017-10-04: qty 5

## 2017-10-04 MED ORDER — TERBUTALINE SULFATE 1 MG/ML IJ SOLN
0.2500 mg | Freq: Once | INTRAMUSCULAR | Status: DC | PRN
  Filled 2017-10-04: qty 1

## 2017-10-04 MED ORDER — LACTATED RINGERS IV SOLN
INTRAVENOUS | Status: DC
  Administered 2017-10-04 – 2017-10-05 (×3): via INTRAVENOUS

## 2017-10-04 MED ORDER — OXYCODONE-ACETAMINOPHEN 5-325 MG PO TABS: 2.0000 | ORAL_TABLET | ORAL | Status: DC | PRN

## 2017-10-04 MED ORDER — FENTANYL CITRATE (PF) 100 MCG/2ML IJ SOLN
100.0000 ug | INTRAMUSCULAR | Status: DC | PRN
  Administered 2017-10-04 – 2017-10-05 (×7): 100 ug via INTRAVENOUS
  Filled 2017-10-04 (×6): qty 2

## 2017-10-04 MED ORDER — MISOPROSTOL 25 MCG QUARTER TABLET
25.0000 ug | ORAL_TABLET | ORAL | Status: DC | PRN
  Administered 2017-10-04 (×2): 25 ug via VAGINAL
  Filled 2017-10-04 (×3): qty 1

## 2017-10-04 MED ORDER — LIDOCAINE HCL (PF) 1 % IJ SOLN
30.0000 mL | INTRAMUSCULAR | Status: AC | PRN
  Administered 2017-10-06: 30 mL via SUBCUTANEOUS
  Filled 2017-10-04: qty 30

## 2017-10-04 MED ORDER — ONDANSETRON HCL 4 MG/2ML IJ SOLN
4.0000 mg | Freq: Four times a day (QID) | INTRAMUSCULAR | Status: DC | PRN
  Administered 2017-10-05 (×3): 4 mg via INTRAVENOUS
  Filled 2017-10-04 (×4): qty 2

## 2017-10-04 MED ORDER — OXYCODONE-ACETAMINOPHEN 5-325 MG PO TABS: 1.0000 | ORAL_TABLET | ORAL | Status: DC | PRN

## 2017-10-04 MED ORDER — SOD CITRATE-CITRIC ACID 500-334 MG/5ML PO SOLN: 30.0000 mL | ORAL | Status: DC | PRN

## 2017-10-04 MED ORDER — OXYTOCIN 40 UNITS IN LACTATED RINGERS INFUSION - SIMPLE MED
1.0000 m[IU]/min | INTRAVENOUS | Status: DC
  Administered 2017-10-04: 1 m[IU]/min via INTRAVENOUS

## 2017-10-04 MED ORDER — PENICILLIN G POT IN DEXTROSE 60000 UNIT/ML IV SOLN
3.0000 10*6.[IU] | INTRAVENOUS | Status: DC
  Administered 2017-10-04 – 2017-10-06 (×10): 3 10*6.[IU] via INTRAVENOUS
  Filled 2017-10-04 (×12): qty 50

## 2017-10-04 MED ORDER — LACTATED RINGERS IV SOLN: 500.0000 mL | INTRAVENOUS | Status: DC | PRN

## 2017-10-04 MED ORDER — FENTANYL CITRATE (PF) 100 MCG/2ML IJ SOLN
INTRAMUSCULAR | Status: AC
  Administered 2017-10-04: 100 ug via INTRAVENOUS
  Filled 2017-10-04: qty 2

## 2017-10-04 NOTE — Progress Notes (Signed)
Kristie Thornton is a 26 y.o. G1P0 at 2890w0d  admitted for induction of labor due to Post dates. Due date 09/27/17.  Subjective:  Coping well. Not feeling contractions at this time.  Objective: BP 135/83 (BP Location: Right Arm)   Pulse 88   Temp 98.2 F (36.8 C) (Oral)   Resp 18   Ht 5' 0.5" (1.537 m)   Wt 157 lb (71.2 kg)   LMP 12/12/2016 (Exact Date)   BMI 30.16 kg/m  No intake/output data recorded. No intake/output data recorded.  FHT:  FHR: 130 bpm, variability: moderate,  accelerations:  Present,  decelerations:  Absent UC:   regular, every 2 minutes SVE:   Dilation: 2 Effacement (%): 70 Station: -2 Exam by:: Kristie ShellerHeather Jaclyn Thornton CNM  Labs: Lab Results  Component Value Date   WBC 10.9 (H) 10/04/2017   HGB 12.6 10/04/2017   HCT 37.6 10/04/2017   MCV 100.3 (H) 10/04/2017   PLT 265 10/04/2017    Assessment / Plan: IOL, last had cytotec about 3 hours ago. Foley bulb placed. Will start pitocin in an hour.   Labor: latent phase  Preeclampsia:  NA Fetal Wellbeing:  Category I Pain Control:  Labor support without medications I/D:  n/a Anticipated MOD:  NSVD  Kristie ShellerHeather Lauriel Thornton 10/04/2017, 4:13 PM

## 2017-10-04 NOTE — Anesthesia Pain Management Evaluation Note (Signed)
  CRNA Pain Management Visit Note  Patient: Kristie Thornton, 26 y.o., female  "Hello I am a member of the anesthesia team at [facility]. We have an anesthesia team available at all times to provide care throughout the hospital, including epidural management and anesthesia for C-section. I don't know your plan for the delivery whether it a natural birth, water birth, IV sedation, nitrous supplementation, doula or epidural, but we want to meet your pain goals."   1.Was your pain managed to your expectations on prior hospitalizations?   No prior hospitalizations  2.What is your expectation for pain management during this hospitalization?     Labor support with medications  3.How can we help you reach that goal?patient would like to just try IV pain meds, but is open to getting an epidural.  Record the patient's initial score and the patient's pain goal.   Pain: 4  Pain Goal: 10 [facility] wants you to be able to say your pain was always managed very well.  Kristie Thornton,Kristie Thornton 10/04/2017

## 2017-10-04 NOTE — H&P (Signed)
Braidyn Dory PeruChung is a 26 y.o. female presenting for IOL 2/2 postdates. OB History    Gravida Para Term Preterm AB Living   1             SAB TAB Ectopic Multiple Live Births                 Past Medical History:  Diagnosis Date  . Anxiety   . Depression   . History of IBS   . PTSD (post-traumatic stress disorder)   . Suicide and self-inflicted injury [facility](HCC)    Past Surgical History:  Procedure Laterality Date  . NO PAST SURGERIES     Family History: family history includes ADD / ADHD in her brother; Cancer in her mother; Cancer (age of onset: 4675) in her father; Fibromyalgia in her sister. Social History:  reports that she has quit smoking. Her smoking use included cigarettes. she has never used smokeless tobacco. She reports that she does not drink alcohol or use drugs.   Clinic  [facility] Prenatal Labs  Dating  early US Blood type: A/Positive/-- (04/17 1022)   Genetic Screen 1 Screen: Nml AFP: Nml   Antibody:Negative (04/17 1022)  Anatomic US Nml female Rubella: <0.90 (04/17 1022)  GTT  Third trimester: Nml RPR: Non Reactive (04/17 1022)   Flu vaccine  Declined HBsAg: Negative (04/17 1022)   TDaP vaccine  07/02/17                  Rhogam: N/A HIV: Non Reactive (04/17 1022)   Baby Food  both                                              GBS: pos  Contraception  unsure - maybe IUD Pap: 10/2015 negative.  Circumcision  NA   Pediatrician  list given    Support Person  [place] (FOB)        Maternal Diabetes: No Genetic Screening: Normal Maternal Ultrasounds/Referrals: Normal Fetal Ultrasounds or other Referrals:  None Maternal Substance Abuse:  No Significant Maternal Medications:  None Significant Maternal Lab Results:  None Other Comments:  None  ROS Maternal Medical History:  Reason for admission: Induction of labor 2/2 post-dates   Contractions: Frequency: rare.   Perceived severity is mild.    Fetal activity: Perceived fetal activity is normal.   Last perceived fetal  movement was within the past hour.    Prenatal complications: Polyhydramnios (Mild poly noted, [facility]   Prenatal Complications - Diabetes: none.    Dilation: 1 Effacement (%): Thick Station: -3 Exam by:: Milus GlazierJennifer Hamilton RN Blood pressure (!) 146/105, pulse (!) 109, temperature 98.8 F (37.1 C), temperature source Oral, resp. rate 18, height 5' 0.5" (1.537 m), weight 157 lb (71.2 kg), last menstrual period 12/12/2016, unknown if currently breastfeeding. Maternal Exam:  Uterine Assessment: Contraction frequency is rare.   Abdomen: Patient reports no abdominal tenderness. Fundal height is 40.   Estimated fetal weight is AGA, 7lbs by leopolds .   Fetal presentation: vertex  Introitus: Normal vulva. Normal vagina.    Fetal Exam Fetal Monitor Review: Mode: ultrasound.   Baseline rate: 145.  Variability: moderate (6-25 bpm).   Pattern: accelerations present and no decelerations.    Fetal State Assessment: Category I - tracings are normal.     Physical Exam  Nursing note and vitals reviewed. Constitutional:  She is oriented to person, place, and time. She appears well-developed and well-nourished. No distress.  HENT:  Head: Normocephalic.  Cardiovascular: Normal rate.  Respiratory: Effort normal.  GI: Soft. There is no tenderness. There is no rebound.  Neurological: She is alert and oriented to person, place, and time.  Skin: Skin is warm and dry.  Psychiatric: She has a normal mood and affect.    Prenatal labs: ABO, Rh: A/Positive/-- (04/17 1022) Antibody: Negative (04/17 1022) Rubella: <0.90 (04/17 1022) RPR: Non Reactive (09/12 1310)  HBsAg: Negative (04/17 1022)  HIV:   Non reactive  GBS: Positive (11/07 1514)   Assessment/Plan: 26 y.o. G1P0 at 3315w0d IOL 2/2 post-dates Admit to labor and delivery  Routine orders Cytotec for cervical ripening Anticipate NSVD   Thressa ShellerHeather Hogan 10/04/2017, 9:32 AM

## 2017-10-05 ENCOUNTER — Inpatient Hospital Stay (HOSPITAL_COMMUNITY): Admitting: Anesthesiology

## 2017-10-05 ENCOUNTER — Encounter (HOSPITAL_COMMUNITY): Payer: Self-pay

## 2017-10-05 LAB — CBC
HCT: 36.8 % (ref 36.0–46.0)
HEMATOCRIT: 34.6 % — AB (ref 36.0–46.0)
HEMOGLOBIN: 11.6 g/dL — AB (ref 12.0–15.0)
Hemoglobin: 12.3 g/dL (ref 12.0–15.0)
MCH: 34 pg (ref 26.0–34.0)
MCH: 34 pg (ref 26.0–34.0)
MCHC: 33.4 g/dL (ref 30.0–36.0)
MCHC: 33.5 g/dL (ref 30.0–36.0)
MCV: 101.5 fL — AB (ref 78.0–100.0)
MCV: 101.7 fL — AB (ref 78.0–100.0)
PLATELETS: 245 10*3/uL (ref 150–400)
Platelets: 246 10*3/uL (ref 150–400)
RBC: 3.41 MIL/uL — ABNORMAL LOW (ref 3.87–5.11)
RBC: 3.62 MIL/uL — ABNORMAL LOW (ref 3.87–5.11)
RDW: 13.4 % (ref 11.5–15.5)
RDW: 13.5 % (ref 11.5–15.5)
WBC: 14.7 10*3/uL — ABNORMAL HIGH (ref 4.0–10.5)
WBC: 15.2 10*3/uL — ABNORMAL HIGH (ref 4.0–10.5)

## 2017-10-05 LAB — RPR: RPR Ser Ql: NONREACTIVE

## 2017-10-05 MED ORDER — OXYTOCIN 40 UNITS IN LACTATED RINGERS INFUSION - SIMPLE MED
1.0000 m[IU]/min | INTRAVENOUS | Status: DC
  Administered 2017-10-06: 30 m[IU]/min via INTRAVENOUS

## 2017-10-05 MED ORDER — LIDOCAINE HCL (PF) 1 % IJ SOLN
INTRAMUSCULAR | Status: DC | PRN
  Administered 2017-10-05: 7 mL via EPIDURAL
  Administered 2017-10-05: 5 mL via EPIDURAL

## 2017-10-05 MED ORDER — EPHEDRINE 5 MG/ML INJ
10.0000 mg | INTRAVENOUS | Status: DC | PRN
  Filled 2017-10-05: qty 2

## 2017-10-05 MED ORDER — FENTANYL 2.5 MCG/ML BUPIVACAINE 1/10 % EPIDURAL INFUSION (WH - ANES)
14.0000 mL/h | INTRAMUSCULAR | Status: DC | PRN
  Administered 2017-10-05 – 2017-10-06 (×3): 14 mL/h via EPIDURAL
  Filled 2017-10-05 (×3): qty 100

## 2017-10-05 MED ORDER — PHENYLEPHRINE 40 MCG/ML (10ML) SYRINGE FOR IV PUSH (FOR BLOOD PRESSURE SUPPORT)
80.0000 ug | PREFILLED_SYRINGE | INTRAVENOUS | Status: DC | PRN
  Filled 2017-10-05: qty 5

## 2017-10-05 MED ORDER — EPHEDRINE 5 MG/ML INJ
10.0000 mg | INTRAVENOUS | Status: DC | PRN
Start: 2017-10-05 — End: 2017-10-06
  Filled 2017-10-05: qty 2

## 2017-10-05 MED ORDER — DIPHENHYDRAMINE HCL 50 MG/ML IJ SOLN: 12.5000 mg | INTRAMUSCULAR | Status: DC | PRN

## 2017-10-05 MED ORDER — LACTATED RINGERS IV SOLN
500.0000 mL | Freq: Once | INTRAVENOUS | Status: DC
Start: 2017-10-05 — End: 2017-10-06

## 2017-10-05 MED ORDER — PHENYLEPHRINE 40 MCG/ML (10ML) SYRINGE FOR IV PUSH (FOR BLOOD PRESSURE SUPPORT)
80.0000 ug | PREFILLED_SYRINGE | INTRAVENOUS | Status: DC | PRN
  Filled 2017-10-05: qty 5
  Filled 2017-10-05 (×2): qty 10

## 2017-10-05 NOTE — Progress Notes (Signed)
Labor Progress Note Rene Dory PeruChung is a 26 y.o. G1P0 at [redacted]w[redacted]d presented for IOL for postdates. S: No complaints  O:  BP 127/87   Pulse 88   Temp 98.1 F (36.7 C) (Axillary)   Resp 16   Ht 5' 0.5" (1.537 m)   Wt 157 lb (71.2 kg)   LMP 12/12/2016 (Exact Date)   BMI 30.16 kg/m    CVE: Dilation: 10 Dilation Complete Date: 10/05/17 Dilation Complete Time: 2341 Effacement (%): 100 Cervical Position: Middle Station: 0, +1 Presentation: Vertex Exam by:: Ileana LaddLexie Ament, RN   A&P: 26 y.o. G1P0 492w1d here for IOL for postdates. #Labor: Progressing well. Allow patient to labor down.   Rolm BookbinderAmber Cobe Viney, DO 11:46 PM

## 2017-10-05 NOTE — Anesthesia Preprocedure Evaluation (Signed)
Anesthesia Evaluation  Patient identified by MRN, date of birth, ID band Patient awake    Reviewed: Allergy & Precautions, H&P , NPO status , Patient's Chart, lab work & pertinent test results  Airway Mallampati: I  TM Distance: >3 FB Neck ROM: full    Dental no notable dental hx. (+) Teeth Intact   Pulmonary neg pulmonary ROS, former smoker,    Pulmonary exam normal breath sounds clear to auscultation       Cardiovascular negative cardio ROS Normal cardiovascular exam Rhythm:regular Rate:Normal     Neuro/Psych    GI/Hepatic negative GI ROS, Neg liver ROS,   Endo/Other  negative endocrine ROS  Renal/GU negative Renal ROS     Musculoskeletal negative musculoskeletal ROS (+)   Abdominal Normal abdominal exam  (+)   Peds  Hematology negative hematology ROS (+)   Anesthesia Other Findings   Reproductive/Obstetrics (+) Pregnancy                             Anesthesia Physical Anesthesia Plan  ASA: II  Anesthesia Plan: Epidural   Post-op Pain Management:    Induction:   PONV Risk Score and Plan:   Airway Management Planned:   Additional Equipment:   Intra-op Plan:   Post-operative Plan:   Informed Consent: I have reviewed the patients History and Physical, chart, labs and discussed the procedure including the risks, benefits and alternatives for the proposed anesthesia with the patient or authorized representative who has indicated his/her understanding and acceptance.     Plan Discussed with:   Anesthesia Plan Comments:         Anesthesia Quick Evaluation

## 2017-10-05 NOTE — Anesthesia Procedure Notes (Signed)
Epidural Patient location during procedure: OB Start time: 10/05/2017 12:00 PM End time: 10/05/2017 12:04 PM  Staffing Anesthesiologist: Leilani AbleHatchett, Lynde Ludwig, MD Performed: anesthesiologist   Preanesthetic Checklist Completed: patient identified, surgical consent, pre-op evaluation, timeout performed, IV checked, risks and benefits discussed and monitors and equipment checked  Epidural Patient position: sitting Prep: site prepped and draped and DuraPrep Patient monitoring: continuous pulse ox and blood pressure Approach: midline Location: L3-L4 Injection technique: LOR air  Needle:  Needle type: Tuohy  Needle gauge: 17 G Needle length: 9 cm and 9 Needle insertion depth: 5 cm cm Catheter type: closed end flexible Catheter size: 19 Gauge Catheter at skin depth: 10 cm Test dose: negative and Other  Assessment Sensory level: T9 Events: blood not aspirated, injection not painful, no injection resistance, negative IV test and no paresthesia  Additional Notes Reason for block:procedure for pain

## 2017-10-05 NOTE — Progress Notes (Signed)
Labor Progress Note Kristie Thornton is a 26 y.o. G1P0 at 4047w1d presented for IOL for postdates. S: No complaints per patient.  O:  BP 114/74   Pulse 88   Temp 98.3 F (36.8 C) (Oral)   Resp 17   Ht 5' 0.5" (1.537 m)   Wt 157 lb (71.2 kg)   LMP 12/12/2016 (Exact Date)   BMI 30.16 kg/m  EFM: 130 bpm/mod var/pos acels/no decels  CVE: Dilation: 7 Effacement (%): 90 Cervical Position: Middle Station: 0 Presentation: Vertex Exam by:: Kristie LaddLexie Ament, RN   A&P: 26 y.o. G1P0 3147w1d here for IOL for postdates. #Labor: IUPC in place and not adequate. Continue pitocin until adequate contraction pattern. #Pain: epidural #FWB: cat 1   Chandra Asher, DO 8:40 PM

## 2017-10-05 NOTE — Progress Notes (Signed)
Labor Progress Note Reed Dory PeruChung is a 26 y.o. G1P0 at 5123w1d presented for IOL for postdates. S:   O:  BP 119/69   Pulse 77   Temp 98.2 F (36.8 C) (Oral)   Resp 18   Ht 5' 0.5" (1.537 m)   Wt 157 lb (71.2 kg)   LMP 12/12/2016 (Exact Date)   BMI 30.16 kg/m    CVE: 5/50/-2  A&P: 26 y.o. G1P0 1623w1d here for IOL for posrdates #Labor: has not made much progress. AROM at 0400 with clear fluid. IUPC placed.   Marquerite Forsman YeadonMoss, DO 4:12 AM

## 2017-10-05 NOTE — Progress Notes (Signed)
Kristie Thornton is a 26 y.o. G1P0 at 386w1d by ultrasound admitted for induction of labor due to Post dates. Due date 12/1.  Subjective:   Objective: BP 123/74   Pulse 81   Temp 98.2 F (36.8 C) (Oral)   Resp 18   Ht 5' 0.5" (1.537 m)   Wt 157 lb (71.2 kg)   LMP 12/12/2016 (Exact Date)   BMI 30.16 kg/m  No intake/output data recorded. Total I/O In: -  Out: 2000 [Urine:2000]  FHT:  FHR: 120;s bpm, variability: moderate,  accelerations:  Present,  decelerations:  Absent UC:   regular, every 2-3 minutes SVE:   Dilation: 4.5 Effacement (%): 70 Station: -2, -1 Exam by:: Milus GlazierJennifer Hamilton, RN  Labs: Lab Results  Component Value Date   WBC 14.7 (H) 10/05/2017   HGB 12.3 10/05/2017   HCT 36.8 10/05/2017   MCV 101.7 (H) 10/05/2017   PLT 245 10/05/2017    Assessment / Plan: Induction of labor due to postterm,  progressing well on pitocin  Labor: Progressing normally Preeclampsia:  no signs or symptoms of toxicity Fetal Wellbeing:  Category I Pain Control:  Epidural I/D:  n/a Anticipated MOD:  NSVD  Wyvonnia DuskyMarie Chloe Bluett 10/05/2017, 4:43 PM

## 2017-10-05 NOTE — Progress Notes (Signed)
Kristie Thornton is a 26 y.o. G1P0 at 3420w1d by ultrasound admitted for induction of labor due to Post dates. Due date 12/1.  Subjective:   Objective: BP 129/80 (BP Location: Left Arm)   Pulse 97   Temp 98.2 F (36.8 C) (Oral)   Resp 18   Ht 5' 0.5" (1.537 m)   Wt 157 lb (71.2 kg)   LMP 12/12/2016 (Exact Date)   BMI 30.16 kg/m  No intake/output data recorded. No intake/output data recorded.  FHT:  FHR: 130 bpm, variability: moderate,  accelerations:  Present,  decelerations:  Absent UC:   regular, every 4-6 minutes mild intensity SVE:   Dilation: 4.5 Effacement (%): 60 Station: -2 Exam by:: Milus GlazierJennifer Hamilton RN  Labs: Lab Results  Component Value Date   WBC 15.2 (H) 10/05/2017   HGB 11.6 (L) 10/05/2017   HCT 34.6 (L) 10/05/2017   MCV 101.5 (H) 10/05/2017   PLT 246 10/05/2017    Assessment / Plan: Induction of labor due to postterm,  progressing well on pitocin  Labor: Progressing normally Preeclampsia:  no signs or symptoms of toxicity Fetal Wellbeing:  Category I Pain Control:  IV pain meds I/D:  n/a Anticipated MOD:  NSVD  Kristie Thornton 10/05/2017, 9:10 AM

## 2017-10-05 NOTE — Progress Notes (Signed)
Maty Dory PeruChung is a 26 y.o. G1P0 at 8923w1d by ultrasound admitted for induction of labor due to Post dates. Due date 12/1.  Subjective:   Objective: BP 131/79 (BP Location: Left Arm)   Pulse 75   Temp 98.2 F (36.8 C) (Oral)   Resp 18   Ht 5' 0.5" (1.537 m)   Wt 157 lb (71.2 kg)   LMP 12/12/2016 (Exact Date)   BMI 30.16 kg/m  No intake/output data recorded. No intake/output data recorded.  FHT:  FHR: 125-130 bpm, variability: moderate,  accelerations:  Present,  decelerations:  Absent UC:   regular, every 3 minutes SVE:   Dilation: 4 Effacement (%): 70 Station: -2 Exam by:: Milus GlazierJennifer Hamilton RN  Labs: Lab Results  Component Value Date   WBC 14.7 (H) 10/05/2017   HGB 12.3 10/05/2017   HCT 36.8 10/05/2017   MCV 101.7 (H) 10/05/2017   PLT 245 10/05/2017    Assessment / Plan: Induction of labor due to postterm,  progressing well on pitocin  Labor: Progressing on Pitocin, will continue to increase then AROM Preeclampsia:  no signs or symptoms of toxicity Fetal Wellbeing:  Category I Pain Control:  Labor support without medications I/D:  n/a Anticipated MOD:  NSVD  Wyvonnia DuskyMarie Lawson 10/05/2017, 12:56 PM

## 2017-10-06 ENCOUNTER — Encounter (HOSPITAL_COMMUNITY): Payer: Self-pay

## 2017-10-06 DIAGNOSIS — Z3A41 41 weeks gestation of pregnancy: Secondary | ICD-10-CM

## 2017-10-06 DIAGNOSIS — O480 Post-term pregnancy: Secondary | ICD-10-CM

## 2017-10-06 MED ORDER — ACETAMINOPHEN 325 MG PO TABS: 650.0000 mg | ORAL_TABLET | ORAL | Status: DC | PRN

## 2017-10-06 MED ORDER — ONDANSETRON HCL 4 MG/2ML IJ SOLN: 4.0000 mg | INTRAMUSCULAR | Status: DC | PRN

## 2017-10-06 MED ORDER — MISOPROSTOL 200 MCG PO TABS
ORAL_TABLET | ORAL | Status: AC
  Filled 2017-10-06: qty 5

## 2017-10-06 MED ORDER — DIBUCAINE 1 % RE OINT: 1.0000 "application " | TOPICAL_OINTMENT | RECTAL | Status: DC | PRN

## 2017-10-06 MED ORDER — PRENATAL MULTIVITAMIN CH
1.0000 | ORAL_TABLET | Freq: Every day | ORAL | Status: DC
  Administered 2017-10-06 – 2017-10-08 (×3): 1 via ORAL
  Filled 2017-10-06 (×3): qty 1

## 2017-10-06 MED ORDER — IBUPROFEN 600 MG PO TABS
600.0000 mg | ORAL_TABLET | Freq: Four times a day (QID) | ORAL | Status: DC
  Administered 2017-10-06 – 2017-10-08 (×10): 600 mg via ORAL
  Filled 2017-10-06 (×9): qty 1

## 2017-10-06 MED ORDER — DIPHENHYDRAMINE HCL 50 MG/ML IJ SOLN: 50.0000 mg | Freq: Once | INTRAMUSCULAR | Status: DC

## 2017-10-06 MED ORDER — COCONUT OIL OIL
1.0000 "application " | TOPICAL_OIL | Status: DC | PRN
  Filled 2017-10-06: qty 120

## 2017-10-06 MED ORDER — OXYCODONE HCL 5 MG PO TABS: 5.0000 mg | ORAL_TABLET | ORAL | Status: DC | PRN

## 2017-10-06 MED ORDER — DIPHENHYDRAMINE HCL 25 MG PO CAPS: 25.0000 mg | ORAL_CAPSULE | Freq: Four times a day (QID) | ORAL | Status: DC | PRN

## 2017-10-06 MED ORDER — SIMETHICONE 80 MG PO CHEW: 80.0000 mg | CHEWABLE_TABLET | ORAL | Status: DC | PRN

## 2017-10-06 MED ORDER — LIDOCAINE-EPINEPHRINE (PF) 2 %-1:200000 IJ SOLN
INTRAMUSCULAR | Status: DC | PRN
  Administered 2017-10-06 (×2): 5 mL via INTRADERMAL

## 2017-10-06 MED ORDER — ZOLPIDEM TARTRATE 5 MG PO TABS: 5.0000 mg | ORAL_TABLET | Freq: Every evening | ORAL | Status: DC | PRN

## 2017-10-06 MED ORDER — TETANUS-DIPHTH-ACELL PERTUSSIS 5-2.5-18.5 LF-MCG/0.5 IM SUSP: 0.5000 mL | Freq: Once | INTRAMUSCULAR | Status: DC

## 2017-10-06 MED ORDER — SENNOSIDES-DOCUSATE SODIUM 8.6-50 MG PO TABS
2.0000 | ORAL_TABLET | ORAL | Status: DC
  Administered 2017-10-06 – 2017-10-07 (×2): 2 via ORAL
  Filled 2017-10-06 (×2): qty 2

## 2017-10-06 MED ORDER — BENZOCAINE-MENTHOL 20-0.5 % EX AERO
1.0000 "application " | INHALATION_SPRAY | CUTANEOUS | Status: DC | PRN
  Administered 2017-10-06: 1 via TOPICAL
  Filled 2017-10-06: qty 56

## 2017-10-06 MED ORDER — ONDANSETRON HCL 4 MG PO TABS: 4.0000 mg | ORAL_TABLET | ORAL | Status: DC | PRN

## 2017-10-06 MED ORDER — WITCH HAZEL-GLYCERIN EX PADS: 1.0000 "application " | MEDICATED_PAD | CUTANEOUS | Status: DC | PRN

## 2017-10-06 NOTE — Anesthesia Postprocedure Evaluation (Signed)
Anesthesia Post Note  Patient: Kristie Thornton  Procedure(s) Performed: AN AD HOC LABOR EPIDURAL     Patient location during evaluation: Mother Baby Anesthesia Type: Epidural Level of consciousness: awake and alert Pain management: pain level controlled Vital Signs Assessment: post-procedure vital signs reviewed and stable Respiratory status: spontaneous breathing, nonlabored ventilation and respiratory function stable Cardiovascular status: stable Postop Assessment: no headache, no backache and epidural receding Anesthetic complications: no    Last Vitals:  Vitals:   10/06/17 1010 10/06/17 1120  BP: 136/85 127/82  Pulse: (!) 103 (!) 105  Resp: 18 18  Temp:  36.9 C  SpO2: 98% 100%    Last Pain:  Vitals:   10/06/17 1233  TempSrc:   PainSc: 2    Pain Goal:                 EchoStarMERRITT,Qadir Folks

## 2017-10-07 NOTE — Progress Notes (Signed)
Post Partum Day 1 Subjective: no complaints, up ad lib, voiding, tolerating PO and + flatus  Objective: Blood pressure 125/83, pulse 79, temperature 97.7 F (36.5 C), temperature source Oral, resp. rate 18, height 5' 0.5" (1.537 m), weight 153 lb 9.6 oz (69.7 kg), last menstrual period 12/12/2016, SpO2 99 %, unknown if currently breastfeeding.  Physical Exam:  General: alert, cooperative and no distress Lochia: appropriate Uterine Fundus: firm Incision: n/a DVT Evaluation: No evidence of DVT seen on physical exam.  Recent Labs    10/05/17 0027 10/05/17 1110  HGB 11.6* 12.3  HCT 34.6* 36.8    Assessment/Plan: Plan for discharge tomorrow, Breastfeeding and Lactation consult   LOS: 3 days   Rolm BookbinderCaroline M Neill 10/07/2017, 6:56 AM

## 2017-10-07 NOTE — Lactation Note (Signed)
This note was copied from a baby's chart. Lactation Consultation Note: Mother was given Lactation brochure and basic breastfeeding teaching done.  Mother reports that infant has been cluster feeding. Mother has a crack on the left nipple and a positional strip on the right nipple. Mother assist with latching infant on the left breast in cross cradle hold. Mother reports #4 pain scale. Assist with flanging infants lips for wider gape. Infant placed in football hold and assist mother with latch . Infant on and off . Mother fit with a #24 nipple shield. Mother continues to report painful latch with the nipple shield. Mother discouraged that she was unable to hand express colostrum or pump colostrum.  Mother inquiring about the use of formula.   Mother was given supplemental guidelines  Mother declined the use of a curved tip syringe and desired to use a bottle for supplement.  Infant was given 10 ml of formula by LC. Advised mother to pace bottle feed. Mother advised to continue to post pump for 15-20 mins. Mother was given comfort gels and advised not to use coconut oil while using comfort gels.  Mother receptive to plan of care. Advised mother to page for latch assist when breastfeeding.   Patient Name: Kristie Thornton Today's Date: 10/07/2017 Reason for consult: Initial assessment   Maternal Data    Feeding Feeding Type: Formula Nipple Type: Slow - flow Length of feed: 10 min  LATCH Score Latch: Repeated attempts needed to sustain latch, nipple held in mouth throughout feeding, stimulation needed to elicit sucking reflex.  Audible Swallowing: None  Type of Nipple: Everted at rest and after stimulation  Comfort (Breast/Nipple): Filling, red/small blisters or bruises, mild/mod discomfort  Hold (Positioning): Assistance needed to correctly position infant at breast and maintain latch.  LATCH Score: 5  Interventions Interventions: Assisted with latch;Skin to skin;Breast massage;Adjust  position;Support pillows;Position options;Expressed milk;Comfort gels  Lactation Tools Discussed/Used Tools: Nipple Shields Nipple shield size: 24 Pump Review: Setup, frequency, and cleaning;Milk Storage Initiated by:: Dolly RiasKim Isley  Date initiated:: 10/07/17   Consult Status Consult Status: Follow-up Date: 10/08/17 Follow-up type: In-patient    Stevan BornKendrick, Ily [facility] 10/07/2017, 12:42 PM

## 2017-10-08 DIAGNOSIS — Z8759 Personal history of other complications of pregnancy, childbirth and the puerperium: Secondary | ICD-10-CM | POA: Insufficient documentation

## 2017-10-08 DIAGNOSIS — O139 Gestational [pregnancy-induced] hypertension without significant proteinuria, unspecified trimester: Secondary | ICD-10-CM

## 2017-10-08 HISTORY — DX: Gestational (pregnancy-induced) hypertension without significant proteinuria, unspecified trimester: O13.9

## 2017-10-08 MED ORDER — TRIAMTERENE-HCTZ 37.5-25 MG PO TABS: 1.0000 | ORAL_TABLET | Freq: Every day | ORAL | 2 refills | Status: DC

## 2017-10-08 MED ORDER — TRIAMTERENE-HCTZ 37.5-25 MG PO TABS
1.0000 | ORAL_TABLET | Freq: Every day | ORAL | Status: DC
  Administered 2017-10-08: 1 via ORAL
  Filled 2017-10-08 (×2): qty 1

## 2017-10-08 MED ORDER — IBUPROFEN 600 MG PO TABS: 600.0000 mg | ORAL_TABLET | Freq: Four times a day (QID) | ORAL | 0 refills | Status: DC | PRN

## 2017-10-08 NOTE — Progress Notes (Signed)
CSW acknowledges consult and completed clinical assessment.  Clinical documentation will follow.  There are no barriers to d/c.  Kees Idrovo Boyd-Gilyard, MSW, LCSW Clinical Social Work [phone]   

## 2017-10-08 NOTE — Discharge Summary (Signed)
OB Discharge Summary     Patient Name: Kristie Thornton DOB: 13-Feb-1991 MRN: [id number]  Date of admission: 10/04/2017 Delivering MD: Rolm BookbinderMOSS, AMBER   Date of discharge: 10/08/2017  Admitting diagnosis: 41 WK INDUCTION Intrauterine pregnancy: 5954w2d     Secondary diagnosis:  Active Problems:   Post-dates pregnancy   Vacuum extractor delivery, delivered   Gestational hypertension  Additional problems: GBS pos; hx anxiety/PTSD     Discharge diagnosis: Term Pregnancy Delivered and Gestational Hypertension                                                                                                Post partum procedures:none  Augmentation: AROM, Pitocin, Cytotec and Foley Balloon  Complications: None  Hospital course:  Induction of Labor With Vaginal Delivery   26 y.o. yo G1P1001 at 5154w2d was admitted to the hospital 10/04/2017 for induction of labor.  Indication for induction: Postdates.  Patient had an uncomplicated labor course as follows: Membrane Rupture Time/Date: 4:09 AM ,10/05/2017   Intrapartum Procedures: Episiotomy: None [1]                                         Lacerations:  2nd degree [3];Periurethral [8]  Patient had delivery of a Viable infant.  Information for the patient's newborn:  Dory PeruChung, Girl Zhaniya [[id number]][[id number]]  Delivery Method: Vaginal, Vacuum (Extractor)(Filed from Delivery Summary)   10/06/2017  Details of delivery can be found in separate delivery note.  Patient had a routine postpartum course. Patient is discharged home 10/08/17. At the time of d/c it was noted that her PP blood pressures have been borderline elevated 130-140s/high 80s. No s/s. She was started on Maxzide prior to d/c.  Physical exam  Vitals:   10/07/17 0502 10/07/17 1744 10/07/17 1808 10/08/17 0535  BP: 125/83 (!) 147/89 137/86 138/86  Pulse: 79 98 96 81  Resp: 18 20    Temp: 97.7 F (36.5 C) 98.1 F (36.7 C)  97.9 F (36.6 C)  TempSrc: Oral Axillary  Axillary  SpO2:      Weight:       Height:       General: alert and cooperative Lochia: appropriate Uterine Fundus: firm DVT Evaluation: No evidence of DVT seen on physical exam. Labs: Lab Results  Component Value Date   WBC 14.7 (H) 10/05/2017   HGB 12.3 10/05/2017   HCT 36.8 10/05/2017   MCV 101.7 (H) 10/05/2017   PLT 245 10/05/2017   CMP Latest Ref Rng & Units 10/04/2017  Glucose 65 - 99 mg/dL 213(Y111(H)  BUN 6 - 20 mg/dL 7  Creatinine 8.650.44 - 7.841.00 mg/dL 6.96(E0.37(L)  Sodium 952135 - 841145 mmol/L 135  Potassium 3.5 - 5.1 mmol/L 3.2(L)  Chloride 101 - 111 mmol/L 105  CO2 22 - 32 mmol/L 20(L)  Calcium 8.9 - 10.3 mg/dL 8.9  Total Protein 6.5 - 8.1 g/dL 6.7  Total Bilirubin 0.3 - 1.2 mg/dL 0.3  Alkaline Phos 38 - 126 U/L 137(H)  AST 15 - 41 U/L  16  ALT 14 - 54 U/L 10(L)    Discharge instruction: per After Visit Summary and "Baby and Me Booklet".  After visit meds:  Allergies as of 10/08/2017      Reactions   Sertraline Hcl Other (See Comments)   Suicidality      Medication List    TAKE these medications   ibuprofen 600 MG tablet Commonly known as:  ADVIL,MOTRIN Take 1 tablet (600 mg total) by mouth every 6 (six) hours as needed.   prenatal multivitamin Tabs tablet Take 1 tablet by mouth daily at 12 noon.   triamterene-hydrochlorothiazide 37.5-25 MG tablet Commonly known as:  MAXZIDE-25 Take 1 tablet by mouth daily.       Diet: routine diet  Activity: Advance as tolerated. Pelvic rest for 6 weeks.   Outpatient follow up:4 weeks, with a Baby Love visit in 1 week for blood pressure check; please call for a sooner appointment if you experience depression Follow up Appt:No future appointments. Follow up Visit:No Follow-up on file.  Postpartum contraception: Depo Provera  Newborn Data: Live born female  Birth Weight: 9 lb 4.3 oz (4204 g) APGAR: 6, 8  Newborn Delivery   Time head delivered:  10/06/2017 07:57:00 Birth date/time:  10/06/2017 07:58:00 Delivery type:  Vaginal, Vacuum (Extractor)      Baby Feeding: Bottle Disposition:unsure; under phototherapy currently   10/08/2017 Cam HaiSHAW, KIMBERLY, CNM 9:53 AM

## 2017-10-08 NOTE — Discharge Instructions (Signed)
Postpartum Hypertension °Postpartum hypertension is high blood pressure after pregnancy that remains higher than normal for more than two days after delivery. You may not realize that you have postpartum hypertension if your blood pressure is not being checked regularly. In some cases, postpartum hypertension will go away on its own, usually within a week of delivery. However, for some women, medical treatment is required to prevent serious complications, such as seizures or stroke. °The following things can affect your blood pressure: °· The type of delivery you had. °· Having received IV fluids or other medicines during or after delivery. ° °What are the causes? °Postpartum hypertension may be caused by any of the following or by a combination of any of the following: °· Hypertension that existed before pregnancy (chronic hypertension). °· Gestational hypertension. °· Preeclampsia or eclampsia. °· Receiving a lot of fluid through an IV during or after delivery. °· Medicines. °· HELLP syndrome. °· Hyperthyroidism. °· Stroke. °· Other rare neurological or blood disorders. ° °In some cases, the cause may not be known. °What increases the risk? °Postpartum hypertension can be related to one or more risk factors, such as: °· Chronic hypertension. In some cases, this may not have been diagnosed before pregnancy. °· Obesity. °· Type 2 diabetes. °· Kidney disease. °· Family history of preeclampsia. °· Other medical conditions that cause hormonal imbalances. ° °What are the signs or symptoms? °As with all types of hypertension, postpartum hypertension may not have any symptoms. Depending on how high your blood pressure is, you may experience: °· Headaches. These may be mild, moderate, or severe. They may also be steady, constant, or sudden in onset (thunderclap headache). °· Visual changes. °· Dizziness. °· Shortness of breath. °· Swelling of your hands, feet, lower legs, or face. In some cases, you may have swelling in  more than one of these locations. °· Heart palpitations or a racing heartbeat. °· Difficulty breathing while lying down. °· Decreased urination. ° °Other rare signs and symptoms may include: °· Sweating more than usual. This lasts longer than a few days after delivery. °· Chest pain. °· Sudden dizziness when you get up from sitting or lying down. °· Seizures. °· Nausea or vomiting. °· Abdominal pain. ° °How is this diagnosed? °The diagnosis of postpartum hypertension is made through a combination of physical examination findings and testing of your blood and urine. You may also have additional tests, such as a CT scan or an MRI, to check for other complications of postpartum hypertension. °How is this treated? °When blood pressure is high enough to require treatment, your options may include: °· Medicines to reduce blood pressure (antihypertensives). Tell your health care provider if you are breastfeeding or if you plan to breastfeed. There are many antihypertensive medicines that are safe to take while breastfeeding. °· Stopping medicines that may be causing hypertension. °· Treating medical conditions that are causing hypertension. °· Treating the complications of hypertension, such as seizures, stroke, or kidney problems. ° °Your health care provider will also continue to monitor your blood pressure closely and repeatedly until it is within a safe range for you. °Follow these instructions at home: °· Take medicines only as directed by your health care provider. °· Get regular exercise after your health care provider tells you that it is safe. °· Follow your health care provider’s recommendations on fluid and salt restrictions. °· Do not use any tobacco products, including cigarettes, chewing tobacco, or electronic cigarettes. If you need help quitting, ask your health care provider. °·   Keep all follow-up visits as directed by your health care provider. This is important. °Contact a health care provider  if: °· Your symptoms get worse. °· You have new symptoms, such as: °? Headache. °? Dizziness. °? Visual changes. °Get help right away if: °· You develop a severe or sudden headache. °· You have seizures. °· You develop numbness or weakness on one side of your body. °· You have difficulty thinking, speaking, or swallowing. °· You develop severe abdominal pain. °· You develop difficulty breathing, chest pain, a racing heartbeat, or heart palpitations. °These symptoms may represent a serious problem that is an emergency. Do not wait to see if the symptoms will go away. Get medical help right away. Call your local emergency services (911 in the [place] Do not drive yourself to the hospital. °This information is not intended to replace advice given to you by your health care provider. Make sure you discuss any questions you have with your health care provider. °Document Released: 06/17/2014 Document Revised: 03/18/2016 Document Reviewed: 04/28/2014 °Elsevier Interactive Patient Education © 2018 Elsevier Inc. °Vaginal Delivery, Care After °Refer to this sheet in the next few weeks. These instructions provide you with information about caring for yourself after vaginal delivery. Your health care provider may also give you more specific instructions. Your treatment has been planned according to current medical practices, but problems sometimes occur. Call your health care provider if you have any problems or questions. °What can I expect after the procedure? °After vaginal delivery, it is common to have: °· Some bleeding from your vagina. °· Soreness in your abdomen, your vagina, and the area of skin between your vaginal opening and your anus (perineum). °· Pelvic cramps. °· Fatigue. ° °Follow these instructions at home: °Medicines °· Take over-the-counter and prescription medicines only as told by your health care provider. °· If you were prescribed an antibiotic medicine, take it as told by your health care provider. Do  not stop taking the antibiotic until it is finished. °Driving ° °· Do not drive or operate heavy machinery while taking prescription pain medicine. °· Do not drive for 24 hours if you received a sedative. °Lifestyle °· Do not drink alcohol. This is especially important if you are breastfeeding or taking medicine to relieve pain. °· Do not use tobacco products, including cigarettes, chewing tobacco, or e-cigarettes. If you need help quitting, ask your health care provider. °Eating and drinking °· Drink at least 8 eight-ounce glasses of water every day unless you are told not to by your health care provider. If you choose to breastfeed your baby, you may need to drink more water than this. °· Eat high-fiber foods every day. These foods may help prevent or relieve constipation. High-fiber foods include: °? Whole grain cereals and breads. °? Brown rice. °? Beans. °? Fresh fruits and vegetables. °Activity °· Return to your normal activities as told by your health care provider. Ask your health care provider what activities are safe for you. °· Rest as much as possible. Try to rest or take a nap when your baby is sleeping. °· Do not lift anything that is heavier than your baby or 10 lb (4.5 kg) until your health care provider says that it is safe. °· Talk with your health care provider about when you can engage in sexual activity. This may depend on your: °? Risk of infection. °? Rate of healing. °? Comfort and desire to engage in sexual activity. °Vaginal Care °· If you have an episiotomy   or a vaginal tear, check the area every day for signs of infection. Check for: ? More redness, swelling, or pain. ? More fluid or blood. ? Warmth. ? Pus or a bad smell.  Do not use tampons or douches until your health care provider says this is safe.  Watch for any blood clots that may pass from your vagina. These may look like clumps of dark red, brown, or black discharge. General instructions  Keep your perineum clean and  dry as told by your health care provider.  Wear loose, comfortable clothing.  Wipe from front to back when you use the toilet.  Ask your health care provider if you can shower or take a bath. If you had an episiotomy or a perineal tear during labor and delivery, your health care provider may tell you not to take baths for a certain length of time.  Wear a bra that supports your breasts and fits you well.  If possible, have someone help you with household activities and help care for your baby for at least a few days after you leave the hospital.  Keep all follow-up visits for you and your baby as told by your health care provider. This is important. Contact a health care provider if:  You have: ? Vaginal discharge that has a bad smell. ? Difficulty urinating. ? Pain when urinating. ? A sudden increase or decrease in the frequency of your bowel movements. ? More redness, swelling, or pain around your episiotomy or vaginal tear. ? More fluid or blood coming from your episiotomy or vaginal tear. ? Pus or a bad smell coming from your episiotomy or vaginal tear. ? A fever. ? A rash. ? Little or no interest in activities you used to enjoy. ? Questions about caring for yourself or your baby.  Your episiotomy or vaginal tear feels warm to the touch.  Your episiotomy or vaginal tear is separating or does not appear to be healing.  Your breasts are painful, hard, or turn red.  You feel unusually sad or worried.  You feel nauseous or you vomit.  You pass large blood clots from your vagina. If you pass a blood clot from your vagina, save it to show to your health care provider. Do not flush blood clots down the toilet without having your health care provider look at them.  You urinate more than usual.  You are dizzy or light-headed.  You have not breastfed at all and you have not had a menstrual period for 12 weeks after delivery.  You have stopped breastfeeding and you have not had  a menstrual period for 12 weeks after you stopped breastfeeding. Get help right away if:  You have: ? Pain that does not go away or does not get better with medicine. ? Chest pain. ? Difficulty breathing. ? Blurred vision or spots in your vision. ? Thoughts about hurting yourself or your baby.  You develop pain in your abdomen or in one of your legs.  You develop a severe headache.  You faint.  You bleed from your vagina so much that you fill two sanitary pads in one hour. This information is not intended to replace advice given to you by your health care provider. Make sure you discuss any questions you have with your health care provider. Document Released: 10/11/2000 Document Revised: 03/27/2016 Document Reviewed: 10/29/2015 Elsevier Interactive Patient Education  2017 [employer]

## 2017-10-09 NOTE — Clinical Social Work Maternal (Signed)
CLINICAL SOCIAL WORK MATERNAL/CHILD NOTE  Patient Details  Name: Kristie Thornton MRN: [id number] Date of Birth: [birth year]  Date:  10/09/2017  Clinical Social Worker Initiating Note:  Laurey Arrow Date/Time: Initiated:  10/08/17/1114     Child's Name:  Kristie Thornton   Biological Parents:  Mother, Father   Need for Interpreter:  None   Reason for Referral:  Current Substance Use/Substance Use During Pregnancy , Behavioral Health Concerns(Hx of marijuana use during pregnancy. )   Address:  [address] [place] [address]    Phone number:  [phone] (home)     Additional phone number:   Household Members/Support Persons (HM/SP):   (MOB resides with MOB's parents.  FOB is away in the [employer] )   HM/SP Name Relationship DOB or Age  HM/SP -1        HM/SP -2        HM/SP -3        HM/SP -4        HM/SP -5        HM/SP -6        HM/SP -7        HM/SP -8          Natural Supports (not living in the home):  Spouse/significant other, Friends, Immediate Family   Professional Supports: None   Employment: Unemployed   Type of Work:     Education:  [place] arranged:    Pensions consultant:  Multimedia programmer   Other Resources:  [facility]   Cultural/Religious Considerations Which May Impact Care:  Per [employer] Sheet, MOB is [employer].   Strengths:  Ability to meet basic needs , Home prepared for child , Understanding of illness   Psychotropic Medications:         Pediatrician:       Pediatrician List:   [place]      Pediatrician Fax Number:    Risk Factors/Current Problems:  Mental Health Concerns , Substance Use    Cognitive State:  Alert , Able to Concentrate , Insightful , Linear Thinking    Mood/Affect:  Bright , Interested , Relaxed , Happy , Comfortable    CSW Assessment: CSW met with MOB to complete an assessment  for MH hx and SA hx.  When CSW arrived, MOB was resting in the recliner and infant was asleep in bassinet.  CSW explained CSW's roles and MOB was polite an receptive to meeting with CSW.    CSW inquired about MOB's MH hx. MOB share with CSW that MOB ws dx with PTSD, anxiety, and depression after experiencing several different traumas.  MOB did not want to elaborate on the traumas.  MOB also openly shared MOB's suicide attempts and reported the attempts happened prior to [facility] seeking counseling and working through her traumas.  MOB denied being in therapy and being on a medication regiment.  CSW offered MOB resources for outpatient counseling and MOB declined.   CSW provided education regarding the baby blues period vs. perinatal mood disorders, discussed treatment and gave resources for mental health follow up if concerns arise.  CSW recommends self-evaluation during the postpartum time period using the New Mom Checklist from Postpartum Progress and encouraged MOB to contact a medical professional if symptoms are noted at any time. CSW assessed MOB for safety and MOB denied SI and  HI.  MOB presented with insight and awareness and did not demonstrate any acute signs or symptoms. MOB reports a strong support system and feeling prepared to parent.   CSW asked about MOB's SA hx and MOB acknowledged that use of marijuana prior to MOB's pregnancy confirmation.  CSW also acknowledged an opioid addictions and reported, "that was years ago;"  MOB was unable to tell CSW how long ago. CSW informed MOB of the hospital's drug screen policy. MOB was made aware of the 2 drug screenings for the infant.  MOB was understanding and did not have any concerns.  CSW shared with MOB that the infant had a negative UDS, and CSW will monitor the infant's CDS and will make a report to [place] if warranted. CSW offered MOB resources and referrals for substance interventions and MOB declined.     CSW provided review of  Sudden Infant Death Syndrome (SIDS) precautions.    CSW identifies no further need for intervention and no barriers to discharge at this time.  CSW Plan/Description:  Perinatal Mood and Anxiety Disorder (PMADs) Education, Other Patient/Family Education, [place], CSW Will Continue to Monitor Umbilical Cord Tissue Drug Screen Results and Make Report if Warranted, Sudden Infant Death Syndrome (SIDS) Education, No Further Intervention Required/No Barriers to Discharge   Laurey Arrow, MSW, LCSW Clinical Social Work [phone]   Dimple Nanas, LCSW 10/09/2017, 1:21 PM

## 2017-10-15 ENCOUNTER — Encounter: Payer: Self-pay | Admitting: Urgent Care

## 2017-10-15 ENCOUNTER — Ambulatory Visit (INDEPENDENT_AMBULATORY_CARE_PROVIDER_SITE_OTHER): Admitting: Urgent Care

## 2017-10-15 VITALS — BP 113/68 | HR 111 | Temp 98.7°F | Resp 16 | Ht 60.5 in | Wt 132.2 lb

## 2017-10-15 DIAGNOSIS — Z23 Encounter for immunization: Secondary | ICD-10-CM

## 2017-10-15 DIAGNOSIS — Z3009 Encounter for other general counseling and advice on contraception: Secondary | ICD-10-CM

## 2017-10-15 LAB — POCT URINE PREGNANCY: PREG TEST UR: NEGATIVE

## 2017-10-15 MED ORDER — MEDROXYPROGESTERONE ACETATE 150 MG/ML IM SUSY
150.0000 mg | PREFILLED_SYRINGE | INTRAMUSCULAR | Status: DC
  Administered 2017-10-15: 150 mg via INTRAMUSCULAR

## 2017-10-15 NOTE — Patient Instructions (Addendum)
Medroxyprogesterone injection [Contraceptive] What is this medicine? MEDROXYPROGESTERONE (me DROX ee proe JES te rone) contraceptive injections prevent pregnancy. They provide effective birth control for 3 months. Depo-subQ Provera 104 is also used for treating pain related to endometriosis. This medicine may be used for other purposes; ask your health care provider or pharmacist if you have questions. COMMON BRAND NAME(S): Depo-Provera, Depo-subQ Provera 104 What should I tell my health care provider before I take this medicine? They need to know if you have any of these conditions: -frequently drink alcohol -asthma -blood vessel disease or a history of a blood clot in the lungs or legs -bone disease such as osteoporosis -breast cancer -diabetes -eating disorder (anorexia nervosa or bulimia) -high blood pressure -HIV infection or AIDS -kidney disease -liver disease -mental depression -migraine -seizures (convulsions) -stroke -tobacco smoker -vaginal bleeding -an unusual or allergic reaction to medroxyprogesterone, other hormones, medicines, foods, dyes, or preservatives -pregnant or trying to get pregnant -breast-feeding How should I use this medicine? Depo-Provera Contraceptive injection is given into a muscle. Depo-subQ Provera 104 injection is given under the skin. These injections are given by a health care professional. You must not be pregnant before getting an injection. The injection is usually given during the first 5 days after the start of a menstrual period or 6 weeks after delivery of a baby. Talk to your pediatrician regarding the use of this medicine in children. Special care may be needed. These injections have been used in female children who have started having menstrual periods. Overdosage: If you think you have taken too much of this medicine contact a poison control center or emergency room at once. NOTE: This medicine is only for you. Do not share this medicine  with others. What if I miss a dose? Try not to miss a dose. You must get an injection once every 3 months to maintain birth control. If you cannot keep an appointment, call and reschedule it. If you wait longer than 13 weeks between Depo-Provera contraceptive injections or longer than 14 weeks between Depo-subQ Provera 104 injections, you could get pregnant. Use another method for birth control if you miss your appointment. You may also need a pregnancy test before receiving another injection. What may interact with this medicine? Do not take this medicine with any of the following medications: -bosentan This medicine may also interact with the following medications: -aminoglutethimide -antibiotics or medicines for infections, especially rifampin, rifabutin, rifapentine, and griseofulvin -aprepitant -barbiturate medicines such as phenobarbital or primidone -bexarotene -carbamazepine -medicines for seizures like ethotoin, felbamate, oxcarbazepine, phenytoin, topiramate -modafinil -St. John's wort This list may not describe all possible interactions. Give your health care provider a list of all the medicines, herbs, non-prescription drugs, or dietary supplements you use. Also tell them if you smoke, drink alcohol, or use illegal drugs. Some items may interact with your medicine. What should I watch for while using this medicine? This drug does not protect you against HIV infection (AIDS) or other sexually transmitted diseases. Use of this product may cause you to lose calcium from your bones. Loss of calcium may cause weak bones (osteoporosis). Only use this product for more than 2 years if other forms of birth control are not right for you. The longer you use this product for birth control the more likely you will be at risk for weak bones. Ask your health care professional how you can keep strong bones. You may have a change in bleeding pattern or irregular periods. Many females stop having    periods while taking this drug. If you have received your injections on time, your chance of being pregnant is very low. If you think you may be pregnant, see your health care professional as soon as possible. Tell your health care professional if you want to get pregnant within the next year. The effect of this medicine may last a long time after you get your last injection. What side effects may I notice from receiving this medicine? Side effects that you should report to your doctor or health care professional as soon as possible: -allergic reactions like skin rash, itching or hives, swelling of the face, lips, or tongue -breast tenderness or discharge -breathing problems -changes in vision -depression -feeling faint or lightheaded, falls -fever -pain in the abdomen, chest, groin, or leg -problems with balance, talking, walking -unusually weak or tired -yellowing of the eyes or skin Side effects that usually do not require medical attention (report to your doctor or health care professional if they continue or are bothersome): -acne -fluid retention and swelling -headache -irregular periods, spotting, or absent periods -temporary pain, itching, or skin reaction at site where injected -weight gain This list may not describe all possible side effects. Call your doctor for medical advice about side effects. You may report side effects to FDA at 1-800-FDA-1088. Where should I keep my medicine? This does not apply. The injection will be given to you by a health care professional. NOTE: This sheet is a summary. It may not cover all possible information. If you have questions about this medicine, talk to your doctor, pharmacist, or health care provider.  2018 Elsevier/Gold Standard (2008-11-04 18:37:56)     Preventing Pregnancy, Adult Pregnancy can occur any time you have sex. You can even become pregnant if you do not have a regular period or when you are breastfeeding. Using a form of  birth control (contraception) that is best for you can help prevent pregnancy. Talk to your health care provider about the options available to you for preventing pregnancy. Work together to make a decision that is right for you based on your health, lifestyle, values, and preferences. What are options for pregnancy prevention? The only way to completely prevent pregnancy is not to have sex (practice abstinence). If you choose to be sexually active, you can use birth control every time you have sex. Birth control must be used exactly as prescribed by your health care provider, or as recommended by instructions on the package. You may consider the following options for birth control: Reversible prevention  Using a long-acting, reversible form of birth control, such as: ? An intrauterine device (IUD). ? An implantable or injectable hormonal birth control.  Taking birth control pills by mouth (oral pills).  Using a condom. These are most effective when used with another form of birth control, such as birth control pills or an IUD. Condoms also help protect against STIs (sexually transmitted infections).  Learning the signs of fertility and avoiding sex when you notice these signs. Signs may include: ? Increased vaginal discharge. ? Slight changes in body temperature.  Practicing natural family planning (rhythm method). This is the least effective method of preventing pregnancy. This option relies on knowing when you are most likely to release an egg (ovulate) and be most fertile. To be most effective, these methods must be used exactly as told by your health care provider. If you decide that you want to become pregnant, you can stop any of these methods at any time. Permanent prevention  A surgical procedure to prevent pregnancy permanently (sterilization). In this surgery, the fallopian tubes are either blocked or closed off. This prevents eggs from reaching the uterus. Emergency  prevention  Using emergency birth control as needed. This is to be used if you have sex without using birth control and you are concerned that you might be pregnant. Emergency birth control can be purchased from a pharmacy without a prescription. It can prevent pregnancy if taken up to 72 hours after having unprotected sex. ? If you have questions about emergency birth control, ask your health care provider. ? Emergency birth control should not be used on a regular basis. Where to find support: You may be able to get support for preventing pregnancy from:  Clinics and health care providers who can educate you about birth control options. Some clinics offer services whose prices vary based on financial need (sliding scale). Most clinics take health insurance.  A clinic that offers reproductive services. You can find a clinic near you through the Department of Health and Human Services: ThisPath.fiwww.hhs.gov  Where can I get more information? Learn more about preventing pregnancy from:  Centers for Disease Control and Prevention: WorkplaceDirectory.atwww.cdc.gov/reproductivehealth/contraception  https://miller-johnson.net/Womenshealth.gov: PrankCrew.uyhttps://www.womenshealth.gov/a-z-topics/birth-control-methods  Summary  The only completely effective way to prevent pregnancy is to avoid having sex.  Preventing pregnancy depends on finding the birth control method that works best for you. No matter which type of birth control you choose, it should be used correctly every time you have sex.  Condoms, which can be used for birth control, can also protect against STIs. This information is not intended to replace advice given to you by your health care provider. Make sure you discuss any questions you have with your health care provider. Document Released: 10/15/2016 Document Revised: 10/15/2016 Document Reviewed: 10/15/2016 Elsevier Interactive Patient Education  2018 [employer]     IF you received an x-ray today, you will receive an invoice from  [facility]. Please contact [facility] at [phone] with questions or concerns regarding your invoice.   IF you received labwork today, you will receive an invoice from [facility]. Please contact [facility] at [phone] with questions or concerns regarding your invoice.   Our billing staff will not be able to assist you with questions regarding bills from these companies.  You will be contacted with the lab results as soon as they are available. The fastest way to get your results is to activate your My Chart account. Instructions are located on the last page of this paperwork. If you have not heard from us regarding the results in 2 weeks, please contact this office.

## 2017-10-15 NOTE — Progress Notes (Signed)
  MRN: [id number] DOB: May 15, 1991  Subjective:   Kristie Thornton is a 26 y.o. female presenting for Depo shot for contraception. Patient delivered her first baby 10/06/2017. She is not currently breastfeeding. Would also like to consider a flu shot. Her baby's father is currently ill and is worried that she might get sick too. Denies cough, sore throat, stuffy nose, fever.  Kristie Thornton has a current medication list which includes the following prescription(s): ibuprofen, prenatal multivitamin, and triamterene-hydrochlorothiazide. Also is allergic to sertraline hcl.  Kristie Thornton  has a past medical history of Anxiety, Depression, History of IBS, PTSD (post-traumatic stress disorder), and Suicide and self-inflicted injury (HCC). Also  has a past surgical history that includes No past surgeries.  Objective:   Vitals: BP 113/68   Pulse (!) 111   Temp 98.7 F (37.1 C) (Oral)   Resp 16   Ht 5' 0.5" (1.537 m)   Wt 132 lb 3.2 oz (60 kg)   SpO2 100%   BMI 25.39 kg/m   Physical Exam  Constitutional: She is oriented to person, place, and time. She appears well-developed and well-nourished.  Cardiovascular: Normal rate.  Pulmonary/Chest: Effort normal.  Neurological: She is alert and oriented to person, place, and time.   Results for orders placed or performed in visit on 10/15/17 (from the past 24 hour(s))  POCT urine pregnancy     Status: None   Collection Time: 10/15/17  4:28 PM  Result Value Ref Range   Preg Test, Ur Negative Negative   Assessment and Plan :   Encounter for other general counseling or advice on contraception - Plan: POCT urine pregnancy, MedroxyPROGESTERone Acetate SUSY 150 mg  Need for prophylactic vaccination and inoculation against influenza - Plan: Flu Vaccine QUAD 36+ mos IM  Depo injection performed today and is appropriate given that patient is not breastfeeding. Flu shot administered today as well, also provided with counseling on flu shot itself.  Wallis BambergMario Myya Meenach, PA-C Primary Care  at [facility] [phone] 10/15/2017  4:44 PM

## 2017-11-11 ENCOUNTER — Encounter: Payer: Self-pay | Admitting: Family Medicine

## 2017-11-11 ENCOUNTER — Ambulatory Visit (INDEPENDENT_AMBULATORY_CARE_PROVIDER_SITE_OTHER): Admitting: Family Medicine

## 2017-11-11 DIAGNOSIS — Z1389 Encounter for screening for other disorder: Secondary | ICD-10-CM

## 2017-11-11 NOTE — Progress Notes (Signed)
Subjective:     Kristie Thornton is a 27 y.o. female who presents for a postpartum visit. She is 4 weeks postpartum following a spontaneous vaginal delivery. I have fully reviewed the prenatal and intrapartum course. The delivery was at 41 gestational weeks. Outcome: VAVD. Anesthesia: epidural. Postpartum course has been uncomplicated.  She had GHTN, but BP has been normal w/o meds Baby's course has been uncomplicated. Baby is feeding by bottle - gerber gentle start. Bleeding brown. Bowel function is normal. Bladder function is urine leaking when sneezing.. Patient is not sexually active. Contraception method is Depo-Provera injections, which she had with her PCP on 10/15/17. Postpartum depression screening: negative.  The following portions of the patient's history were reviewed and updated as appropriate: allergies, current medications, past family history, past medical history, past social history, past surgical history and problem list.  Review of Systems Pertinent items noted in HPI and remainder of comprehensive ROS otherwise negative.   Objective:    BP 119/74   Pulse 74   Wt 135 lb (61.2 kg)   BMI 25.93 kg/m   General:  alert, cooperative and no distress   HEENT:  /AT, normal oral mucosa  Lungs: Normal respiratory effort  Heart:  Normal rate  Abdomen: Soft, ND, NT.    Skin:  warm, dry, no rash  Psych: Normal mood and affect  Neuro: Alert and oriented. CNs grossly intact                 Assessment:     Normal postpartum exam. Pap smear not done at today's visit. Last pap NILM Jan 2017  Plan:    1. Contraception: Depo-Provera injections, had this on 10/15/17 2. GHTN: BP wnl today off meds  3. Follow up in:  as needed. FYI, [place] moving to [place] later this month.  Raynelle FanningJulie P. Kristie Colon, MD OB Fellow

## 2018-08-14 IMAGING — DX DG CHEST 2V
2 series · 2 of 2 positions shown · non-contrast
Comparison: 11/01/2015

CLINICAL DATA: Cough

EXAM:
CHEST  2 VIEW

[chest pa]
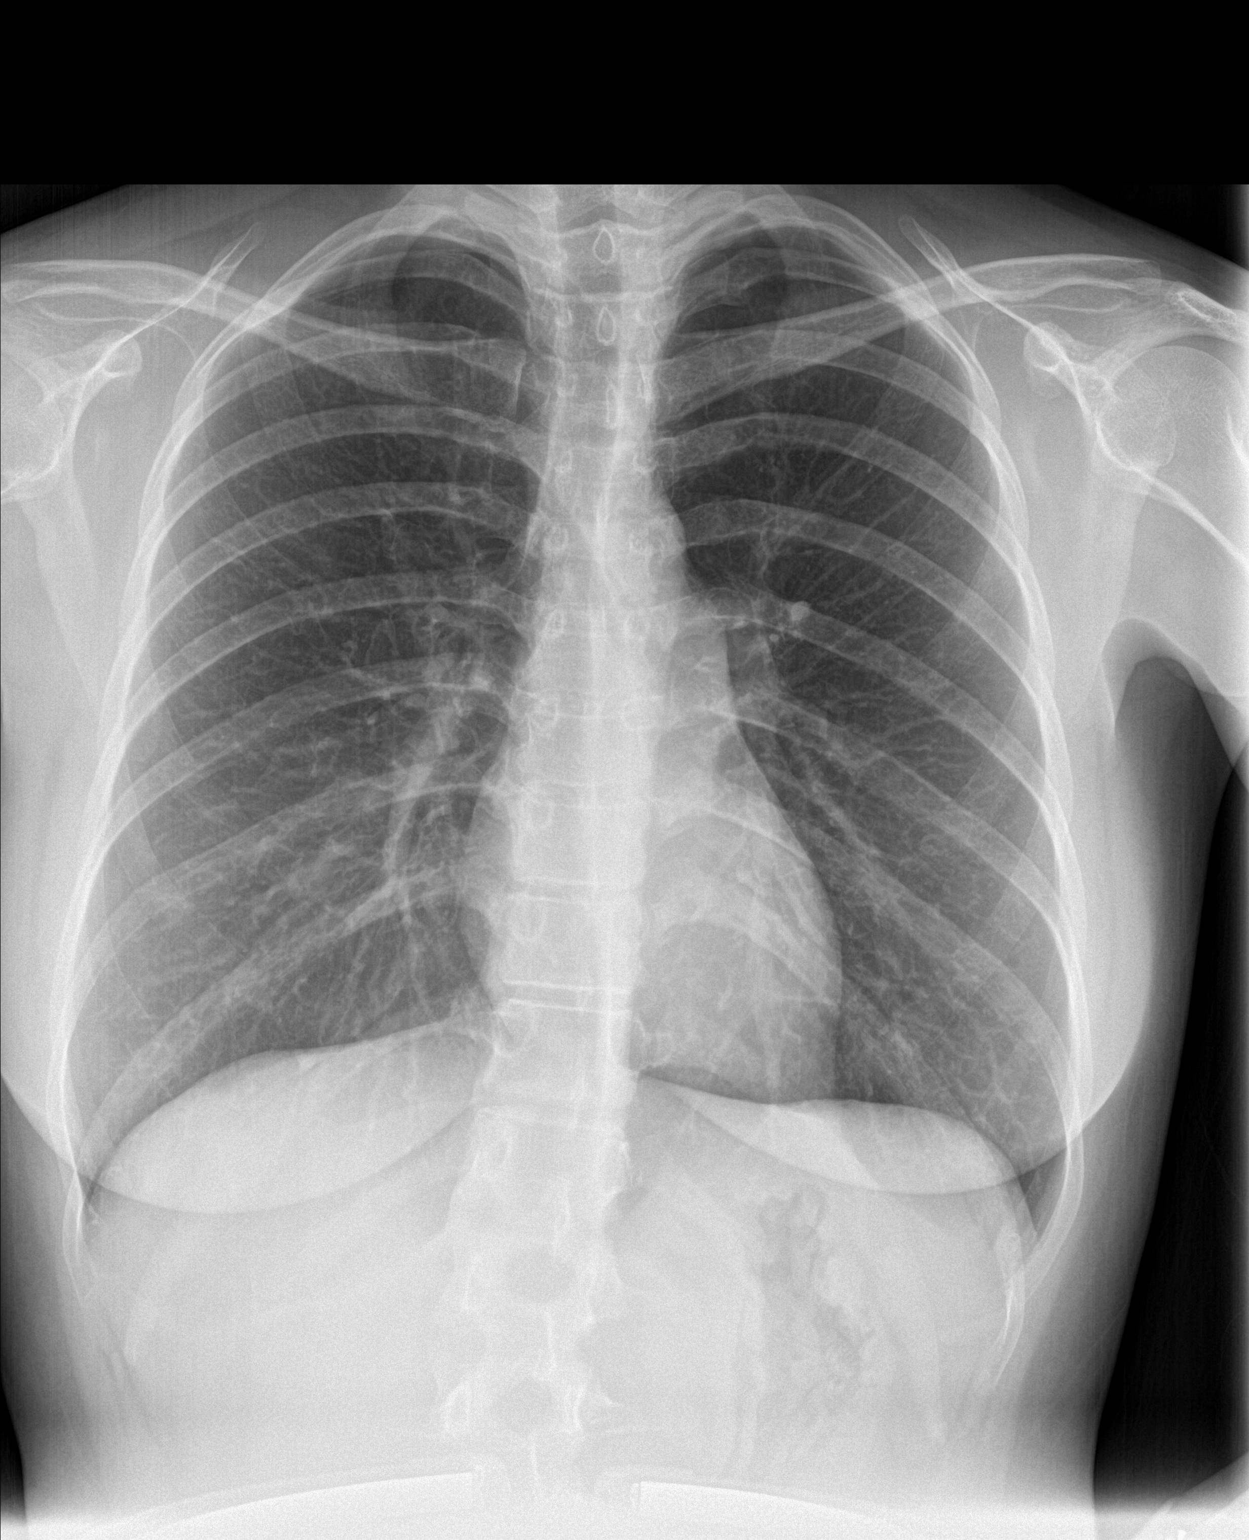

[chest lat]
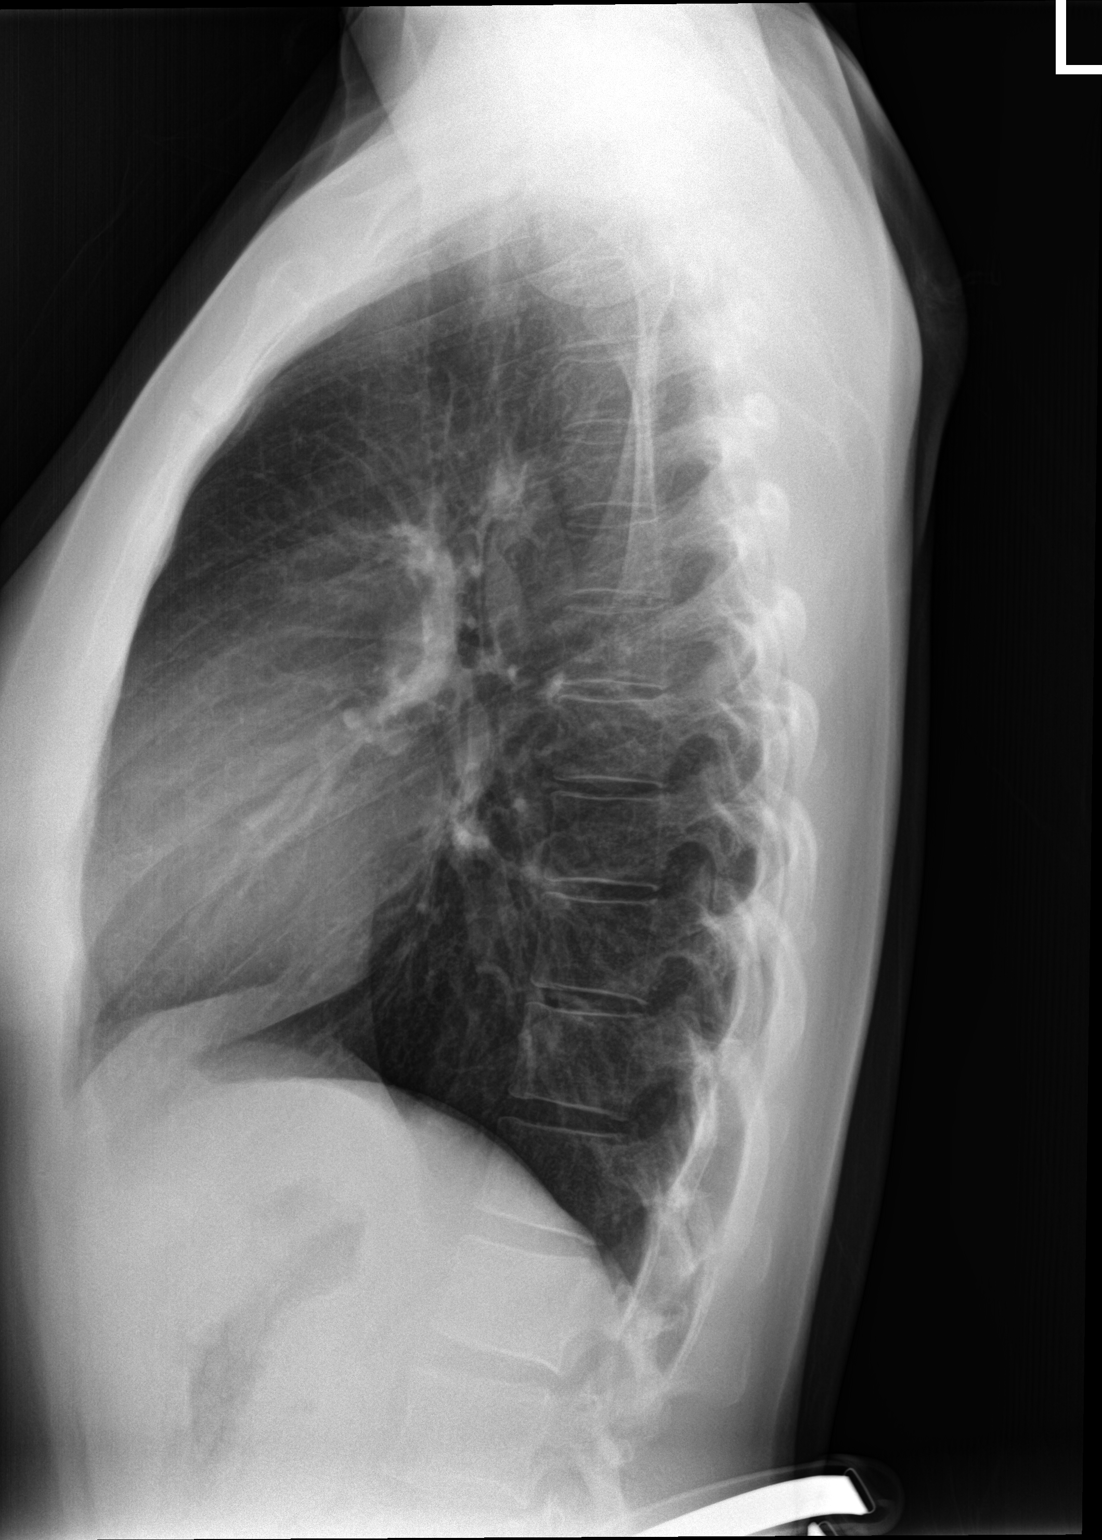

[2 of 2 positions shown; findings below may reference images not displayed]

FINDINGS: The heart size and mediastinal contours are within normal limits.
Both lungs are clear. The visualized skeletal structures are
unremarkable.
IMPRESSION: No active cardiopulmonary disease.

## 2018-09-02 ENCOUNTER — Encounter: Payer: Self-pay | Admitting: *Deleted

## 2018-10-01 ENCOUNTER — Ambulatory Visit: Admitting: Obstetrics & Gynecology

## 2019-02-19 IMAGING — US US MFM OB COMP +14 WKS
1 series · 14 of 28 positions shown · non-contrast
Comparison: none

[Series 1: us mfm ob comp +14 wks · 121 acquisitions, 14 frames shown]
[im 5/121]
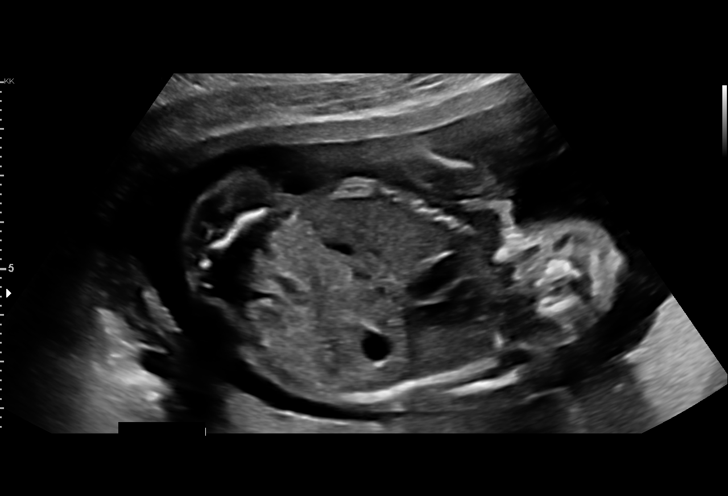
[im 14/121]
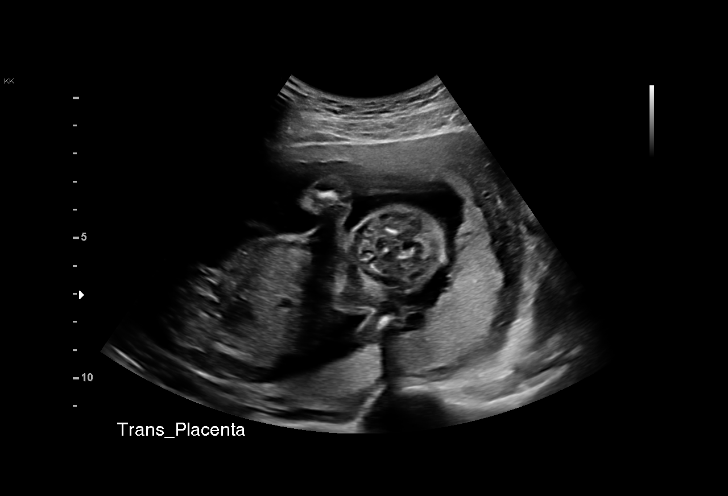
[im 23/121]
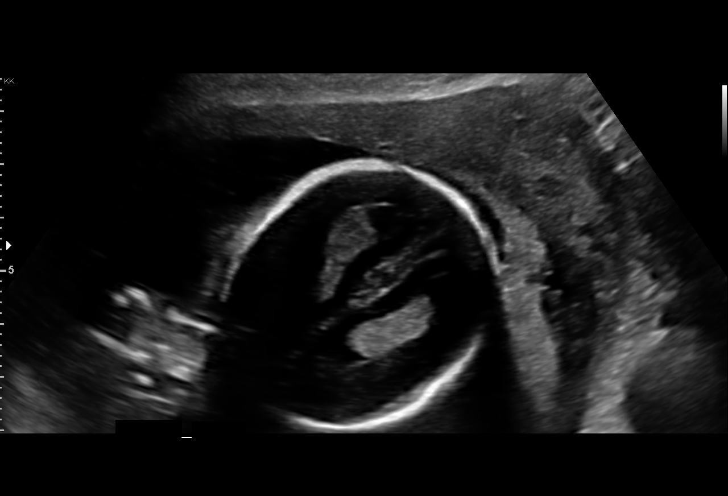
[im 32/121]
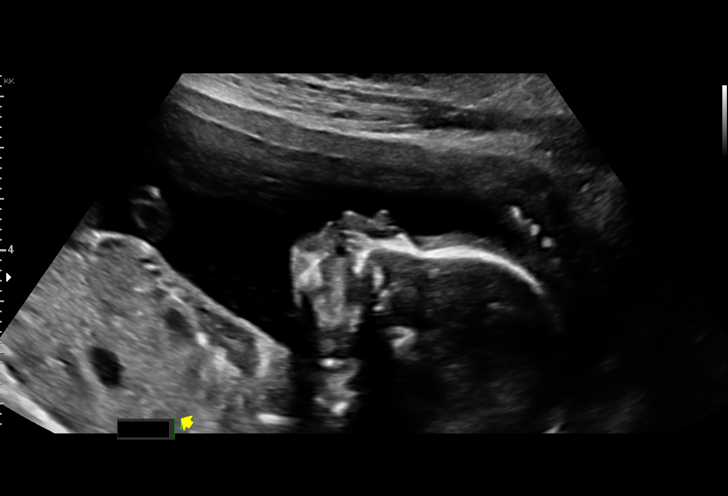
[im 41/121]
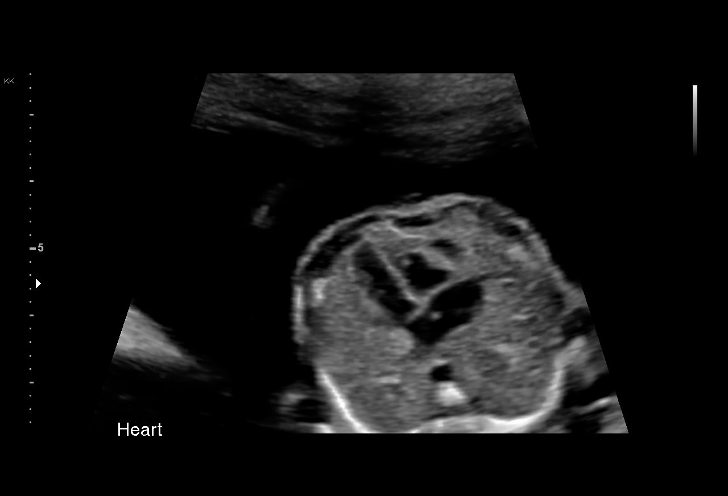
[im 49/121]
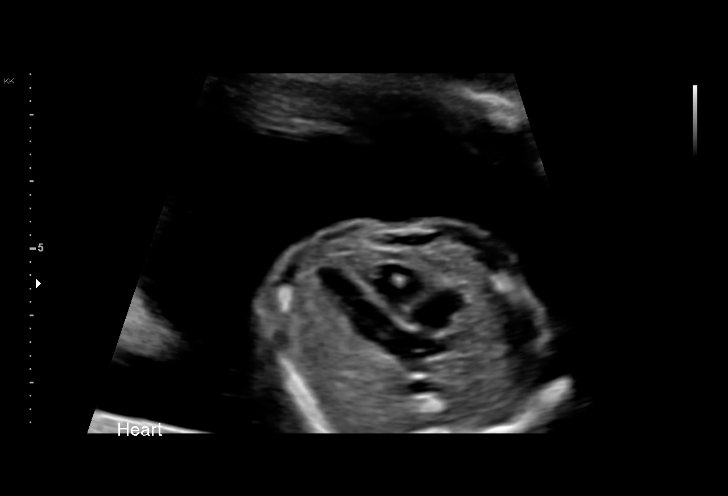
[im 58/121]
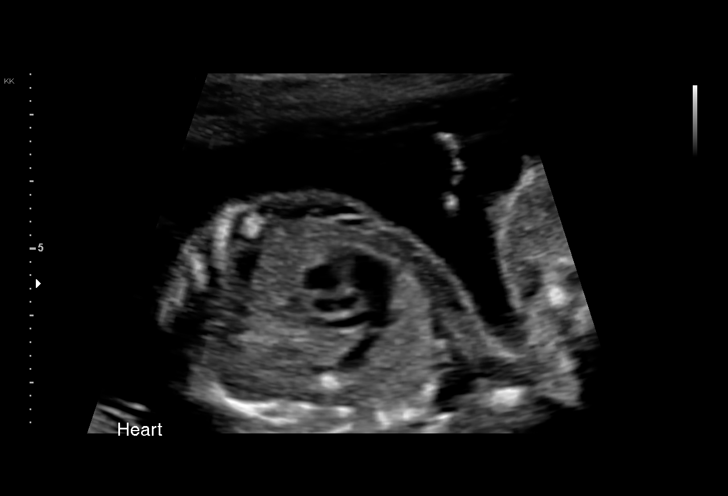
[im 67/121]
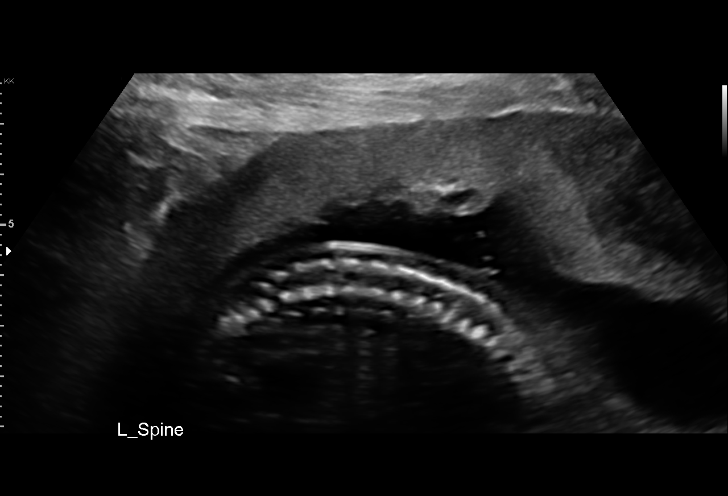
[im 76/121]
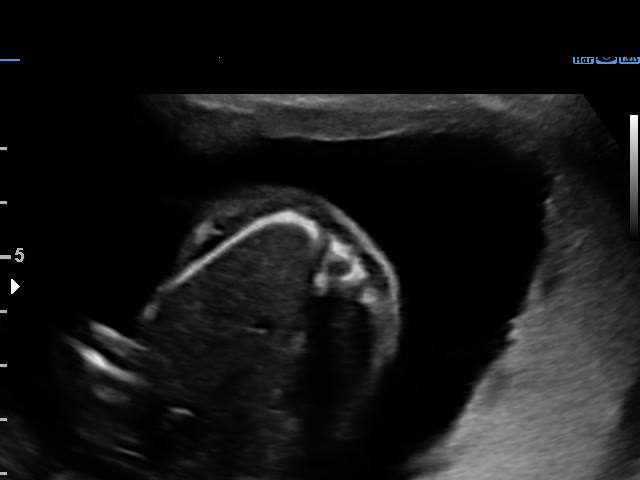
[im 85/121]
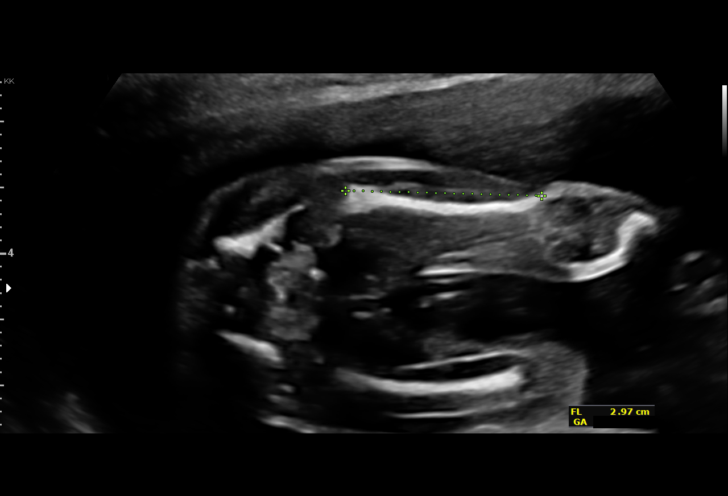
[im 94/121]
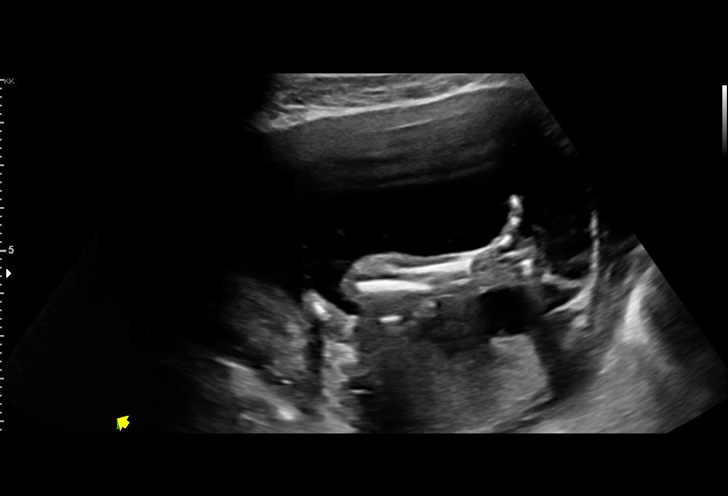
[im 103/121]
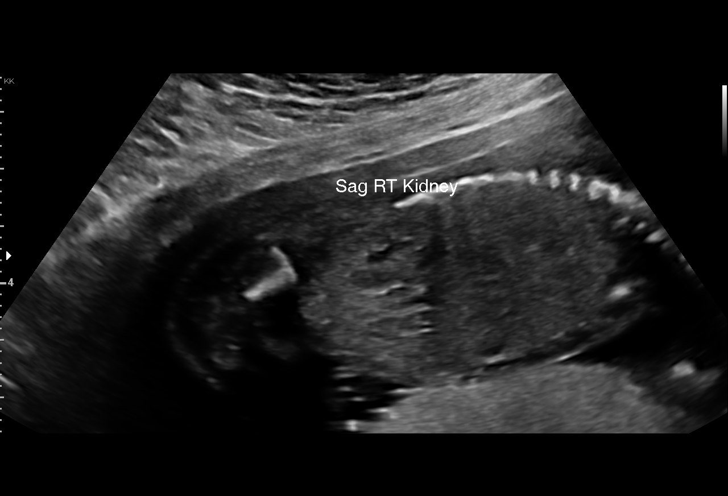
[im 112/121]
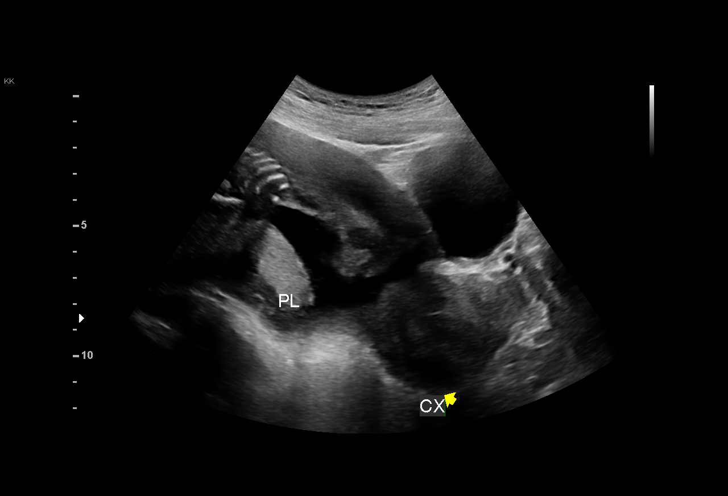
[im 121/121]
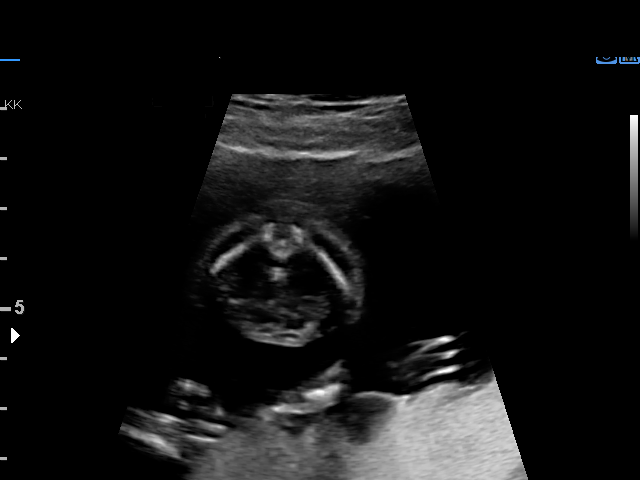

[14 of 28 positions shown; findings below may reference images not displayed]

1  JACENTY EM            [id number]      [id number]     [id number]
Indications

19 weeks gestation of pregnancy
Encounter for antenatal screening for
malformations
Tobacco use complicating pregnancy,
second trimester
OB History

Blood Type:            Height:  5'0"   Weight (lb):  133      BMI:
Gravidity:    1         Term:   0        Prem:   0        SAB:   0
TOP:          0       Ectopic:  0        Living: 0
Fetal Evaluation

Num Of Fetuses:     1
Fetal Heart         144
Rate(bpm):
Cardiac Activity:   Observed
Presentation:       Cephalic
Placenta:           Posterior, above cervical os
P. Cord Insertion:  Visualized

Amniotic Fluid
AFI FV:      Subjectively within normal limits

Largest Pocket(cm)
6.9
Biometry

BPD:      48.8  mm     G. Age:  20w 5d         90  %    CI:        77.01   %   70 - 86
FL/HC:      17.3   %   16.8 -
HC:      176.1  mm     G. Age:  20w 1d         67  %    HC/AC:      1.14       1.09 -
AC:       154   mm     G. Age:  20w 4d         77  %    FL/BPD:     62.3   %
FL:       30.4  mm     G. Age:  19w 3d         37  %    FL/AC:      19.7   %   20 - 24
NFT:         4  mm

Est. FW:     331  gm    0 lb 12 oz      55  %
Gestational Age

LMP:           20w 6d       Date:   12/12/16                 EDD:   09/18/17
U/S Today:     20w 2d                                        EDD:   09/22/17
Best:          19w 4d    Det. By:   U/S C R L  (03/25/17)    EDD:   09/27/17
Anatomy

Cranium:               Appears normal         Aortic Arch:            Appears normal
Cavum:                 Appears normal         Ductal Arch:            Appears normal
Ventricles:            Appears normal         Diaphragm:              Appears normal
Choroid Plexus:        Appears normal         Stomach:                Appears normal, left
sided
Cerebellum:            Appears normal         Abdomen:                Appears normal
Posterior Fossa:       Appears normal         Abdominal Wall:         Appears nml (cord
insert, abd wall)
Nuchal Fold:           Appears normal         Cord Vessels:           Appears normal (3
vessel cord)
Face:                  Appears normal         Kidneys:                Appear normal
(orbits and profile)
Lips:                  Appears normal         Bladder:                Appears normal
Thoracic:              Appears normal         Spine:                  Appears normal
Heart:                 Appears normal; EIF    Upper Extremities:      Appears normal
RVOT:                  Appears normal         Lower Extremities:      Appears normal
LVOT:                  Appears normal

Other:  Female gender. Heels and 5th digit visualized. Technically difficult
due to fetal position.
Cervix Uterus Adnexa

Cervix
Length:              5  cm.
Normal appearance by transabdominal scan.

Adnexa:       No abnormality visualized.
Impression

SIUP at 19+4 weeks
Normal detailed fetal anatomy
Markers of aneuploidy: none
Normal amniotic fluid volume
Measurements consistent with prior US
Recommendations

Follow-up as clinically indicated

## 2021-03-05 DIAGNOSIS — R41840 Attention and concentration deficit: Secondary | ICD-10-CM | POA: Insufficient documentation

## 2021-03-05 DIAGNOSIS — N6310 Unspecified lump in the right breast, unspecified quadrant: Secondary | ICD-10-CM | POA: Insufficient documentation

## 2021-07-16 DIAGNOSIS — Z30013 Encounter for initial prescription of injectable contraceptive: Secondary | ICD-10-CM | POA: Insufficient documentation

## 2021-07-16 DIAGNOSIS — K219 Gastro-esophageal reflux disease without esophagitis: Secondary | ICD-10-CM | POA: Insufficient documentation

## 2021-11-19 NOTE — Progress Notes (Signed)
New Patient Office Visit  Subjective:  Patient ID: Kristie Thornton, female    DOB: Apr 16, 1991  Age: 31 y.o. MRN: [id number]  CC:  Chief Complaint  Patient presents with   Establish Care    NP. Est care. Pt is fasting. Pt c/o chronic BV, pt requesting start back on birth control     HPI Kristie Thornton presents for new patient visit to establish care.  Introduced to Publishing rights manager role and practice setting.  All questions answered.  Discussed provider/patient relationship and expectations.  BACK PAIN  Duration: chronic Mechanism of injury: unknown Location: midline, low back, and upper back Onset: gradual Severity: 4/10 Quality: sharp Frequency: constant Radiation: none Aggravating factors: bending Alleviating factors: NSAIDs and APAP Status: fluctuating Treatments attempted: rest, heat, APAP, and ibuprofen, chiropractor  Relief with NSAIDs?: mild Nighttime pain:  yes Paresthesias / decreased sensation:   sometimes Bowel / bladder incontinence:  no Fevers:  no Dysuria / urinary frequency:  no  FREQUENT BV  States that she has multiple episodes of BV, at least 7 episodes last year. These symptoms tend to occur with sexual activity. She ordered a prebiotic to see if this helps with symptoms, however she has not started this yet. She endorses vaginal discharge and odor today. Denies dysuria and fevers.    Past Medical History:  Diagnosis Date   Abscess    sub areolar left breast   Anxiety    Back pain    Carpal tunnel syndrome during pregnancy 06/03/2017   Chlamydia infection affecting pregnancy, antepartum 03/26/2017   April 2018- POS [x]  TOC neg   Depression    Gestational hypertension 10/08/2017   History of IBS    PTSD (post-traumatic stress disorder)    Suicide and self-inflicted injury ([facility]CC)    when a teenager   Vacuum extractor delivery, delivered 10/06/2017    Past Surgical History:  Procedure Laterality Date   NO PAST SURGERIES      Family History  Problem  Relation Age of Onset   Cancer Mother        colon cancer   Stroke Father    Cancer Father 38       testicular cancer   Fibromyalgia Sister    ADD / ADHD Brother     Social History   Socioeconomic History   Marital status: Divorced    Spouse name: n/a   Number of children: 0   Years of education: 12th +   Highest education level: Not on file  Occupational History   Occupation: Investment banker, corporate: STUDENT    Comment: [employer]  Tobacco Use   Smoking status: Former    Types: Cigarettes   Smokeless tobacco: Never   Tobacco comments:    stopped 02/2017 previously vaped  Vaping Use   Vaping Use: Every day  Substance and Sexual Activity   Alcohol use: No    Alcohol/week: 0.0 standard drinks    Comment: stopped 01/2017 occasional   Drug use: No    Types: Marijuana    Comment: 03/12/17 last week ago, trying to stop   Sexual activity: Yes    Partners: Male    Birth control/protection: None  Other Topics Concern   Not on file  Social History Narrative   Parents are from [place]. Parents and her siblings were born in the [place].   Nail tech   Marital status: divorced      Children: one daughter     Lives: with Lobbyist  Employment: nail tech      Tobacco:  None      Alcohol: weekends; no DWIs      Drugs: marijuana socially         Social Determinants of Health   Financial Resource Strain: Not on file  Food Insecurity: Not on file  Transportation Needs: Not on file  Physical Activity: Not on file  Stress: Not on file  Social Connections: Not on file  Intimate Partner Violence: Not on file    ROS Review of Systems  Constitutional:  Positive for fatigue.  HENT: Negative.    Eyes: Negative.   Respiratory: Negative.    Cardiovascular: Negative.   Gastrointestinal:  Positive for abdominal pain and constipation.  Endocrine: Positive for cold intolerance.  Genitourinary:  Positive for vaginal discharge. Negative for dysuria, flank pain and hematuria.       Vaginal  odor  Musculoskeletal:  Positive for back pain.  Skin: Negative.   Neurological: Negative.   Psychiatric/Behavioral:  Positive for dysphoric mood (better overall).    Objective:   Today's Vitals: BP 98/70    Pulse 94    Temp (!) 96.5 F (35.8 C) (Temporal)    Ht 5' 1.5" (1.562 m)    Wt 124 lb 6.4 oz (56.4 kg)    LMP 07/20/2021 (Approximate)    SpO2 98%    BMI 23.12 kg/m   Physical Exam Vitals and nursing note reviewed. Exam conducted with a chaperone present.  Constitutional:      General: She is not in acute distress.    Appearance: Normal appearance.  HENT:     Head: Normocephalic.     Right Ear: Tympanic membrane, ear canal and external ear normal.     Left Ear: Tympanic membrane, ear canal and external ear normal.  Eyes:     Conjunctiva/sclera: Conjunctivae normal.  Cardiovascular:     Rate and Rhythm: Normal rate and regular rhythm.     Pulses: Normal pulses.     Heart sounds: Normal heart sounds.  Pulmonary:     Effort: Pulmonary effort is normal.     Breath sounds: Normal breath sounds.  Abdominal:     Palpations: Abdomen is soft.     Tenderness: There is no abdominal tenderness.  Genitourinary:    Exam position: Lithotomy position.     Labia:        Right: No rash.        Left: No rash.      Vagina: Normal.     Cervix: Discharge present. No friability, erythema or cervical bleeding.  Musculoskeletal:     Cervical back: Normal range of motion.  Skin:    General: Skin is warm.  Neurological:     General: No focal deficit present.     Mental Status: She is alert and oriented to person, place, and time.  Psychiatric:        Mood and Affect: Mood normal.        Behavior: Behavior normal.        Thought Content: Thought content normal.        Judgment: Judgment normal.    Assessment & Plan:   Problem List Items Addressed This Visit       Genitourinary   BV (bacterial vaginosis)    Ongoing recurrent BV infections. Checking wet prep today for BV, yeast,  trich. Will treat with flagyl BID x 7days. With frequent infections, will have her start flagyl vaginal gel twice a week after finishing PO  course of treatment for BV prevention. She can take the prebiotic as well. Discussed OTC boric acid suppository, but will start with flagyl gel twice a week. Follow up with any concerns.       Relevant Medications   metroNIDAZOLE (FLAGYL) 500 MG tablet     Other   Generalized anxiety disorder    Chronic, ongoing. She feels like the symptoms have improved recently. She has tried sertraline which caused suicidal ideation, paroxetine caused itching. Discussed referral for therapy and she is interested in this. Declines medication at this time. Discussed possibly trialing cymbalta in the future to see if it helps with her back pain as well.       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 She was going to the chiropractor a few years ago, but was unable to continue affording this. X-rays reviewed, which shows mild scoliosis. 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 Will start gabapentin 300mg  at bedtime for 1 week and then she can increase this to twice a day. Daily stretches given. Follow up in 4-6 weeks.       Relevant Medications   gabapentin (NEURONTIN) 300 MG capsule   Birth control counseling    Discussed various birth control options. She has been on depo provera in the past, however after discussing options, she may be wanting and IUD. Will place referral to GYN for possible IUD placement. Urine pregnancy test negative today. Will start depo provera in the meantime.       Relevant Medications   medroxyPROGESTERone (DEPO-PROVERA) injection 150 mg   Other Relevant Orders   POCT urine pregnancy (Completed)   Ambulatory referral to Gynecology   Constipation    Chronic, ongoing. She sometimes has periods where she won't have a bowel  movement for a week. Encouraged her to drink plenty of fluids and increase the fiber in her diet. She can start miralax daily. Referral placed to GI for ongoing issues.       Relevant Orders   Ambulatory referral to Gastroenterology   Vitamin D deficiency    Vitamin D 21 on 07/16/21. She can start an OTC vitamin D supplement 2,000 units daily with food. Will repeat labs in the future.       Vitamin B12 deficiency    Vitamin B12 on the low side of normal on 07/16/21. With numbness and tingling in her hands, she can start vitamin B12 1,000 mcg daily to see if this helps with her symptoms.       Vapes nicotine containing substance    Vapes daily, encouraged cessation.       Other Visit Diagnoses     Screen for STD (sexually transmitted disease)       Screen for HIV, hepatitis C, syphilis, HSV, gonorrhea, and chlamydia today   Relevant Orders   Cervicovaginal ancillary only   RPR   HSV(herpes simplex vrs) 1+2 ab-IgG   HIV Antibody (routine testing w rflx)   Hepatitis C antibody   Encounter to establish care           Outpatient Encounter Medications as of 11/20/2021  Medication Sig   gabapentin (NEURONTIN) 300 MG capsule Take 1 capsule (300 mg total) by mouth 2 (two) times daily. Start 1 capsule at bedtime for 7 days, may increase to twice a day   metroNIDAZOLE (FLAGYL) 500 MG tablet Take 1  tablet (500 mg total) by mouth 2 (two) times daily for 7 days.   [START ON 11/22/2021] metroNIDAZOLE (METROGEL) 0.75 % vaginal gel Place 1 Applicatorful vaginally 2 (two) times a week. Start after finishing flagyl pills   [DISCONTINUED] ibuprofen (ADVIL,MOTRIN) 600 MG tablet Take 1 tablet (600 mg total) by mouth every 6 (six) hours as needed. (Patient not taking: Reported on 11/11/2017)   [DISCONTINUED] Prenatal Vit-Fe Fumarate-FA (PRENATAL MULTIVITAMIN) TABS tablet Take 1 tablet by mouth daily at 12 noon.   [DISCONTINUED] triamterene-hydrochlorothiazide (MAXZIDE-25) 37.5-25 MG tablet Take 1 tablet  by mouth daily. (Patient not taking: Reported on 11/11/2017)   Facility-Administered Encounter Medications as of 11/20/2021  Medication   medroxyPROGESTERone (DEPO-PROVERA) injection 150 mg   [DISCONTINUED] MedroxyPROGESTERone Acetate SUSY 150 mg    Follow-up: Return in about 2 months (around 01/18/2022) for back pain.   Charyl Dancer, NP

## 2021-11-20 ENCOUNTER — Other Ambulatory Visit (HOSPITAL_COMMUNITY)
Admission: RE | Admit: 2021-11-20 | Discharge: 2021-11-20 | Disposition: A | Discharge status: Home / Self Care | Payer: 59 | Source: Ambulatory Visit | Attending: Nurse Practitioner | Admitting: Nurse Practitioner

## 2021-11-20 ENCOUNTER — Encounter: Payer: Self-pay | Admitting: Nurse Practitioner

## 2021-11-20 ENCOUNTER — Telehealth: Payer: Self-pay

## 2021-11-20 ENCOUNTER — Ambulatory Visit (INDEPENDENT_AMBULATORY_CARE_PROVIDER_SITE_OTHER): Payer: 59 | Admitting: Nurse Practitioner

## 2021-11-20 ENCOUNTER — Other Ambulatory Visit: Payer: Self-pay

## 2021-11-20 VITALS — BP 98/70 | HR 94 | Temp 96.5°F | Ht 61.5 in | Wt 124.4 lb

## 2021-11-20 DIAGNOSIS — G8929 Other chronic pain: Secondary | ICD-10-CM

## 2021-11-20 DIAGNOSIS — Z30018 Encounter for initial prescription of other contraceptives: Secondary | ICD-10-CM | POA: Insufficient documentation

## 2021-11-20 DIAGNOSIS — Z113 Encounter for screening for infections with a predominantly sexual mode of transmission: Secondary | ICD-10-CM | POA: Diagnosis present

## 2021-11-20 DIAGNOSIS — E538 Deficiency of other specified B group vitamins: Secondary | ICD-10-CM

## 2021-11-20 DIAGNOSIS — M546 Pain in thoracic spine: Secondary | ICD-10-CM

## 2021-11-20 DIAGNOSIS — K5900 Constipation, unspecified: Secondary | ICD-10-CM

## 2021-11-20 DIAGNOSIS — F411 Generalized anxiety disorder: Secondary | ICD-10-CM

## 2021-11-20 DIAGNOSIS — Z3009 Encounter for other general counseling and advice on contraception: Secondary | ICD-10-CM | POA: Diagnosis not present

## 2021-11-20 DIAGNOSIS — K581 Irritable bowel syndrome with constipation: Secondary | ICD-10-CM | POA: Insufficient documentation

## 2021-11-20 DIAGNOSIS — Z7689 Persons encountering health services in other specified circumstances: Secondary | ICD-10-CM

## 2021-11-20 DIAGNOSIS — B9689 Other specified bacterial agents as the cause of diseases classified elsewhere: Secondary | ICD-10-CM

## 2021-11-20 DIAGNOSIS — N760 Acute vaginitis: Secondary | ICD-10-CM | POA: Insufficient documentation

## 2021-11-20 DIAGNOSIS — E559 Vitamin D deficiency, unspecified: Secondary | ICD-10-CM

## 2021-11-20 DIAGNOSIS — Z720 Tobacco use: Secondary | ICD-10-CM

## 2021-11-20 DIAGNOSIS — Z0000 Encounter for general adult medical examination without abnormal findings: Secondary | ICD-10-CM | POA: Insufficient documentation

## 2021-11-20 LAB — POCT URINE PREGNANCY: Preg Test, Ur: NEGATIVE

## 2021-11-20 MED ORDER — METRONIDAZOLE 500 MG PO TABS: 500.0000 mg | ORAL_TABLET | Freq: Two times a day (BID) | ORAL | 0 refills | Status: AC

## 2021-11-20 MED ORDER — GABAPENTIN 300 MG PO CAPS: 300.0000 mg | ORAL_CAPSULE | Freq: Two times a day (BID) | ORAL | 1 refills | Status: DC

## 2021-11-20 MED ORDER — MEDROXYPROGESTERONE ACETATE 150 MG/ML IM SUSP
150.0000 mg | Freq: Once | INTRAMUSCULAR | Status: AC
  Administered 2021-11-20: 12:00:00 150 mg via INTRAMUSCULAR

## 2021-11-20 MED ORDER — METRONIDAZOLE 0.75 % VA GEL: 1.0000 | VAGINAL | 5 refills | Status: DC

## 2021-11-20 NOTE — Assessment & Plan Note (Signed)
Chronic, ongoing. She sometimes has periods where she won't have a bowel movement for a week. Encouraged her to drink plenty of fluids and increase the fiber in her diet. She can start miralax daily. Referral placed to GI for ongoing issues.

## 2021-11-20 NOTE — Assessment & Plan Note (Signed)
Vapes daily, encouraged cessation.

## 2021-11-20 NOTE — Assessment & Plan Note (Signed)
Vitamin B12 on the low side of normal on 07/16/21. With numbness and tingling in her hands, she can start vitamin B12 1,000 mcg daily to see if this helps with her symptoms.

## 2021-11-20 NOTE — Assessment & Plan Note (Signed)
Ongoing recurrent BV infections. Checking wet prep today for BV, yeast, trich. Will treat with flagyl BID x 7days. With frequent infections, will have her start flagyl vaginal gel twice a week after finishing PO course of treatment for BV prevention. She can take the prebiotic as well. Discussed OTC boric acid suppository, but will start with flagyl gel twice a week. Follow up with any concerns.

## 2021-11-20 NOTE — Telephone Encounter (Signed)
Called and spoke to patient, pat will return to clinic today for depo shot and blood work.

## 2021-11-20 NOTE — Patient Instructions (Addendum)
It was great to see you!  Start flagyl twice a day for 7 days, don't drink alcohol. Then start flagyl vaginal gel twice a week at night to help prevent BV.   Start gabapentin 1 capsule at bedtime for 1 week, if needed you can take twice a day after 1 week. Do back stretches daily.  Start miralax daily and make sure you are drinking plenty of water. I have placed a referral to GI.   We are starting depo and will place a referral to GYN for IUD placement.   I will also place a referral for therapy as discussed.   Start vitamin D 2,000 units daily (with food) and vitamin B12 1,000 mcg daily (with or without food)  Let's follow-up in 2 months, sooner if you have concerns.  If a referral was placed today, you will be contacted for an appointment. Please note that routine referrals can sometimes take up to 3-4 weeks to process. Please call our office if you haven't heard anything after this time frame.  Take care,  Rodman Pickle, NP

## 2021-11-20 NOTE — Assessment & Plan Note (Signed)
Vitamin D 21 on 07/16/21. She can start an OTC vitamin D supplement 2,000 units daily with food. Will repeat labs in the future.

## 2021-11-20 NOTE — Assessment & Plan Note (Signed)
Discussed various birth control options. She has been on depo provera in the past, however after discussing options, she may be wanting and IUD. Will place referral to GYN for possible IUD placement. Urine pregnancy test negative today. Will start depo provera in the meantime.

## 2021-11-20 NOTE — Assessment & Plan Note (Addendum)
Chronic, ongoing for many years. She was going to the chiropractor a few years ago, but was unable to continue affording this. X-rays reviewed, which shows mild scoliosis. 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 Will start gabapentin 300mg  at bedtime for 1 week and then she can increase this to twice a day. Daily stretches given. Follow up in 4-6 weeks.

## 2021-11-20 NOTE — Assessment & Plan Note (Signed)
Chronic, ongoing. She feels like the symptoms have improved recently. She has tried sertraline which caused suicidal ideation, paroxetine caused itching. Discussed referral for therapy and she is interested in this. Declines medication at this time. Discussed possibly trialing cymbalta in the future to see if it helps with her back pain as well.

## 2021-11-21 LAB — CERVICOVAGINAL ANCILLARY ONLY
Bacterial Vaginitis (gardnerella): POSITIVE — AB
Candida Glabrata: NEGATIVE
Candida Vaginitis: NEGATIVE
Chlamydia: NEGATIVE
Comment: NEGATIVE
Comment: NEGATIVE
Comment: NEGATIVE
Comment: NEGATIVE
Comment: NEGATIVE
Comment: NORMAL
Neisseria Gonorrhea: NEGATIVE
Trichomonas: NEGATIVE

## 2021-11-21 LAB — RPR: RPR Ser Ql: NONREACTIVE

## 2021-11-21 LAB — HEPATITIS C ANTIBODY
Hepatitis C Ab: NONREACTIVE
SIGNAL TO CUT-OFF: 0.08 (ref <1.00)

## 2021-11-21 LAB — HIV ANTIBODY (ROUTINE TESTING W REFLEX): HIV 1&2 Ab, 4th Generation: NONREACTIVE

## 2021-11-21 LAB — HSV(HERPES SIMPLEX VRS) I + II AB-IGG
HAV 1 IGG,TYPE SPECIFIC AB: 57.6 index — ABNORMAL HIGH
HSV 2 IGG,TYPE SPECIFIC AB: <0.9 index

## 2021-11-28 ENCOUNTER — Other Ambulatory Visit: Payer: Self-pay | Admitting: Nurse Practitioner

## 2021-11-28 DIAGNOSIS — F411 Generalized anxiety disorder: Secondary | ICD-10-CM

## 2021-11-28 NOTE — Progress Notes (Signed)
Received message from referral that she is interested in medications. Referral changed from therapy to psychiatry.

## 2021-12-04 ENCOUNTER — Encounter: Payer: Self-pay | Admitting: Nurse Practitioner

## 2021-12-09 NOTE — Progress Notes (Signed)
Established Patient Office Visit  Subjective:  Patient ID: Kristie Thornton, female    DOB: [birth year]  Age: 31 y.o. MRN: [id number]  CC:  Chief Complaint  Patient presents with   Follow-up    F/u to discuss adderall medication.     HPI Kristie Thornton presents for trouble with focusing. She took vyvanse when she was in school. She states that she has been having trouble focusing at work and is very fatigued. She works 10 hour days, 6 days a week. Also endorses some anxiety and depression because she is not able to spend some time with her daughter because she is working so much. She also notices irritability and had to leave work early recently because she and the receptionist were yelling about a disagreement in front of customers. She denies mood swings, SI and HI. She has taken SSRIs in the past, however did get suicidal ideation with them. She had testing done with a psychiatrist, however couldn't follow up because her insurance has changed.   Depression screen [facility] 2/9 12/10/2021 11/20/2021 11/11/2017 10/15/2017 09/03/2017  Decreased Interest 0 0 0 0 0  Down, Depressed, Hopeless 1 1 0 0 0  PHQ - 2 Score 1 1 0 0 0  Altered sleeping 2 2 0 - 0  Tired, decreased energy 3 1 0 - 0  Change in appetite 3 1 1  - 0  Feeling bad or failure about yourself  1 0 0 - 0  Trouble concentrating 2 0 0 - 0  Moving slowly or fidgety/restless 2 0 0 - 0  Suicidal thoughts 0 0 0 - 0  PHQ-9 Score 14 5 1  - 0  Difficult doing work/chores Very difficult Somewhat difficult - - -  Some recent data might be hidden   GAD 7 : Generalized Anxiety Score 12/10/2021 11/20/2021 11/11/2017 09/03/2017  Nervous, Anxious, on Edge 1 1 0 0  Control/stop worrying 0 1 0 0  Worry too much - different things 1 1 0 0  Trouble relaxing 2 1 0 0  Restless 2 0 0 0  Easily annoyed or irritable 2 1 0 0  Afraid - awful might happen 0 0 0 0  Total GAD 7 Score 8 5 0 0  Anxiety Difficulty Somewhat difficult Somewhat difficult - -    Past Medical  History:  Diagnosis Date   Abscess    sub areolar left breast   Anxiety    Back pain    Carpal tunnel syndrome during pregnancy 06/03/2017   Chlamydia infection affecting pregnancy, antepartum 03/26/2017   April 2018- POS [x]  TOC neg   Depression    Gestational hypertension 10/08/2017   History of IBS    PTSD (post-traumatic stress disorder)    Suicide and self-inflicted injury ([place])    when a teenager   Vacuum extractor delivery, delivered 10/06/2017    Past Surgical History:  Procedure Laterality Date   NO PAST SURGERIES      Family History  Problem Relation Age of Onset   Cancer Mother        colon cancer   Stroke Father    Cancer Father 92       testicular cancer   Fibromyalgia Sister    ADD / ADHD Brother     Social History   Socioeconomic History   Marital status: Divorced    Spouse name: n/a   Number of children: 0   Years of education: 12th +   Highest education level: Not on file  Occupational History   Occupation: Scientist, clinical (histocompatibility and immunogenetics): STUDENT    Comment: [employer]  Tobacco Use   Smoking status: Former    Types: Cigarettes   Smokeless tobacco: Never   Tobacco comments:    stopped 02/2017 previously vaped  Vaping Use   Vaping Use: Every day  Substance and Sexual Activity   Alcohol use: No    Alcohol/week: 0.0 standard drinks    Comment: stopped 01/2017 occasional   Drug use: No    Types: Marijuana    Comment: 03/12/17 last week ago, trying to stop   Sexual activity: Yes    Partners: Male    Birth control/protection: None  Other Topics Concern   Not on file  Social History Narrative   Parents are from [place]. Parents and her siblings were born in the [place].   Nail tech   Marital status: divorced      Children: one daughter     Lives: with duaghter      Employment: nail tech      Tobacco:  None      Alcohol: weekends; no DWIs      Drugs: marijuana socially         Social Determinants of Health   Financial Resource Strain: Not on file  Food  Insecurity: Not on file  Transportation Needs: Not on file  Physical Activity: Not on file  Stress: Not on file  Social Connections: Not on file  Intimate Partner Violence: Not on file    Outpatient Medications Prior to Visit  Medication Sig Dispense Refill   gabapentin (NEURONTIN) 300 MG capsule Take 1 capsule (300 mg total) by mouth 2 (two) times daily. Start 1 capsule at bedtime for 7 days, may increase to twice a day 60 capsule 1   metroNIDAZOLE (METROGEL) 0.75 % vaginal gel Place 1 Applicatorful vaginally 2 (two) times a week. Start after finishing flagyl pills 70 g 5   No facility-administered medications prior to visit.    Allergies  Allergen Reactions   Sertraline Hcl Other (See Comments)    Suicidality    ROS Review of Systems See pertinent positives and negatives per HPI.   Objective:    Physical Exam Vitals and nursing note reviewed.  Constitutional:      General: She is not in acute distress.    Appearance: Normal appearance.  HENT:     Head: Normocephalic and atraumatic.  Eyes:     Conjunctiva/sclera: Conjunctivae normal.  Cardiovascular:     Rate and Rhythm: Normal rate.  Pulmonary:     Effort: Pulmonary effort is normal.  Musculoskeletal:     Cervical back: Normal range of motion.  Skin:    General: Skin is warm and dry.  Neurological:     General: No focal deficit present.     Mental Status: She is alert and oriented to person, place, and time.  Psychiatric:        Mood and Affect: Mood normal.        Behavior: Behavior normal.        Thought Content: Thought content normal.        Judgment: Judgment normal.    BP (!) 104/59    Pulse 96    Temp (!) 96.8 F (36 C) (Temporal)    Wt 129 lb 6.4 oz (58.7 kg)    BMI 24.05 kg/m  Wt Readings from Last 3 Encounters:  12/10/21 129 lb 6.4 oz (58.7 kg)  11/20/21 124 lb 6.4 oz (  56.4 kg)  11/11/17 135 lb (61.2 kg)     Health Maintenance Due  Topic Date Due   COVID-19 Vaccine (1) Never done   PAP  SMEAR-Modifier  02/26/2019    There are no preventive care reminders to display for this patient.  Lab Results  Component Value Date   TSH 2.33 01/17/2016   Lab Results  Component Value Date   WBC 14.7 (H) 10/05/2017   HGB 12.3 10/05/2017   HCT 36.8 10/05/2017   MCV 101.7 (H) 10/05/2017   PLT 245 10/05/2017   Lab Results  Component Value Date   NA 135 10/04/2017   K 3.2 (L) 10/04/2017   CO2 20 (L) 10/04/2017   GLUCOSE 111 (H) 10/04/2017   BUN 7 10/04/2017   CREATININE 0.37 (L) 10/04/2017   BILITOT 0.3 10/04/2017   ALKPHOS 137 (H) 10/04/2017   AST 16 10/04/2017   ALT 10 (L) 10/04/2017   PROT 6.7 10/04/2017   ALBUMIN 3.3 (L) 10/04/2017   CALCIUM 8.9 10/04/2017   ANIONGAP 10 10/04/2017   Lab Results  Component Value Date   CHOL 282 (H) 09/21/2014   Lab Results  Component Value Date   HDL 65 09/21/2014   Lab Results  Component Value Date   LDLCALC 203 (H) 09/21/2014   Lab Results  Component Value Date   TRIG 68 09/21/2014   Lab Results  Component Value Date   CHOLHDL 4.3 09/21/2014   No results found for: HGBA1C    Assessment & Plan:   Problem List Items Addressed This Visit       Other   Bipolar 1 disorder ([place]) - Primary    She was diagnosed in December 2022 with bipolar and generalized anxiety disorder by Dr. Lacinda Axon with psychiatry. She was not able to follow up because her insurance changed and he was no longer in network. He recommended treating bipolar disorder to see if this is what is causing her inability to focus and fatigue. Will start her on quetiapine 25mg  qHS for 7 days, then increase to 50mg  qHS. She currently denies SI/HI. Encouraged her to stop medication and call office or go to ER if these symptoms develop. Referral to psychiatry placed. Follow up in 4-6 weeks.       Relevant Orders   Ambulatory referral to Psychiatry    Meds ordered this encounter  Medications   QUEtiapine (SEROQUEL) 50 MG tablet    Sig: Take 1 tablet (50 mg  total) by mouth at bedtime. Start 1/2 tablet at bedtime for 7 days and then take 1 tablet at bedtime    Dispense:  30 tablet    Refill:  1   ondansetron (ZOFRAN-ODT) 4 MG disintegrating tablet    Sig: Take 1 tablet (4 mg total) by mouth every 8 (eight) hours as needed for nausea or vomiting.    Dispense:  20 tablet    Refill:  1    Follow-up: Return in about 4 weeks (around 01/07/2022) for 4-6 weeks mood.    Charyl Dancer, NP

## 2021-12-10 ENCOUNTER — Encounter: Payer: Self-pay | Admitting: Nurse Practitioner

## 2021-12-10 ENCOUNTER — Other Ambulatory Visit: Payer: Self-pay

## 2021-12-10 ENCOUNTER — Ambulatory Visit (INDEPENDENT_AMBULATORY_CARE_PROVIDER_SITE_OTHER): Payer: 59 | Admitting: Nurse Practitioner

## 2021-12-10 VITALS — BP 104/59 | HR 96 | Temp 96.8°F | Wt 129.4 lb

## 2021-12-10 DIAGNOSIS — F319 Bipolar disorder, unspecified: Secondary | ICD-10-CM | POA: Insufficient documentation

## 2021-12-10 MED ORDER — QUETIAPINE FUMARATE 50 MG PO TABS: 50.0000 mg | ORAL_TABLET | Freq: Every day | ORAL | 1 refills | Status: DC

## 2021-12-10 MED ORDER — ONDANSETRON 4 MG PO TBDP: 4.0000 mg | ORAL_TABLET | Freq: Three times a day (TID) | ORAL | 1 refills | Status: DC | PRN

## 2021-12-10 NOTE — Patient Instructions (Signed)
It was great to see you!  Start quetiapine 1/2 tablet at bedtime for 7 days, then 1 tablet at bedtime. I have placed a referral to psychiatry.  Let's follow-up in 4-6 weeks, sooner if you have concerns.  If a referral was placed today, you will be contacted for an appointment. Please note that routine referrals can sometimes take up to 3-4 weeks to process. Please call our office if you haven't heard anything after this time frame.  Take care,  Rodman Pickle, NP

## 2021-12-10 NOTE — Assessment & Plan Note (Signed)
She was diagnosed in December 2022 with bipolar and generalized anxiety disorder by Dr. Lacinda Axon with psychiatry. She was not able to follow up because her insurance changed and he was no longer in network. He recommended treating bipolar disorder to see if this is what is causing her inability to focus and fatigue. Will start her on quetiapine 25mg  qHS for 7 days, then increase to 50mg  qHS. She currently denies SI/HI. Encouraged her to stop medication and call office or go to ER if these symptoms develop. Referral to psychiatry placed. Follow up in 4-6 weeks.

## 2021-12-12 ENCOUNTER — Encounter: Payer: Self-pay | Admitting: Nurse Practitioner

## 2022-01-09 ENCOUNTER — Other Ambulatory Visit: Payer: Self-pay | Admitting: Nurse Practitioner

## 2022-01-12 ENCOUNTER — Encounter: Payer: Self-pay | Admitting: Nurse Practitioner
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
## 2022-01-15 ENCOUNTER — Ambulatory Visit (INDEPENDENT_AMBULATORY_CARE_PROVIDER_SITE_OTHER): Payer: 59 | Admitting: Nurse Practitioner

## 2022-01-15 ENCOUNTER — Other Ambulatory Visit: Payer: Self-pay

## 2022-01-15 ENCOUNTER — Encounter: Payer: Self-pay | Admitting: Nurse Practitioner

## 2022-01-15 ENCOUNTER — Ambulatory Visit: Payer: 59 | Admitting: Obstetrics and Gynecology

## 2022-01-15 VITALS — BP 118/90 | HR 114 | Ht 61.5 in | Wt 119.0 lb

## 2022-01-15 DIAGNOSIS — F319 Bipolar disorder, unspecified: Secondary | ICD-10-CM

## 2022-01-15 DIAGNOSIS — F411 Generalized anxiety disorder: Secondary | ICD-10-CM

## 2022-01-15 DIAGNOSIS — M546 Pain in thoracic spine: Secondary | ICD-10-CM | POA: Diagnosis not present

## 2022-01-15 DIAGNOSIS — G8929 Other chronic pain: Secondary | ICD-10-CM | POA: Diagnosis not present

## 2022-01-15 MED ORDER — QUETIAPINE FUMARATE 50 MG PO TABS: 50.0000 mg | ORAL_TABLET | Freq: Every day | ORAL | 1 refills | Status: DC

## 2022-01-15 MED ORDER — LIDOCAINE 5 % EX PTCH: 1.0000 | MEDICATED_PATCH | CUTANEOUS | 3 refills | Status: DC

## 2022-01-15 MED ORDER — GABAPENTIN 300 MG PO CAPS: 300.0000 mg | ORAL_CAPSULE | Freq: Two times a day (BID) | ORAL | 1 refills | Status: DC

## 2022-01-15 MED ORDER — CLONAZEPAM 0.5 MG PO TABS: 0.5000 mg | ORAL_TABLET | Freq: Two times a day (BID) | ORAL | 0 refills | Status: DC | PRN

## 2022-01-15 NOTE — Assessment & Plan Note (Signed)
Chronic, ongoing.  She was started on Seroquel 50 mg nightly last visit for bipolar disorder.  She states that it has helped some of her symptoms, however she is still having a lot of anxiety.  She has taken Klonopin in the past, she also has a history of suicidal ideation with Lexapro and sertraline.  PDMP reviewed.  Discussed addicting behaviors of Klonopin and that she should not drive while taking it.  Do not take Klonopin and Seroquel at the same time.  Prescription sent for Klonopin 0.5 mg twice daily as needed for 2 weeks.  Reached out to the referral coordinator to follow-up on her referral to psychiatry.  The referral Crader was able to find a psychiatrist who takes her insurance, however the patient needs to call herself as it is only self-referral.  Psychiatry information given and patient instructed to call today as soon as she leaves her appointment.  Follow-up in 6 to 8 weeks or sooner with concerns. ?

## 2022-01-15 NOTE — Patient Instructions (Addendum)
It was great to see you! ? ?I have sent in 2 weeks of klonopin for you to take as needed. Do not take this with your seroquel. It may make you sleepy. Do not drive while taking. ? ?[facility]:  ? ?Phone: [phone]  ?Office Hours Monday-Friday 9am-5pm ?WE ARE LOCATED AT:[address] ? ?Let's follow-up in 2 months, sooner if you have concerns. ? ?If a referral was placed today, you will be contacted for an appointment. Please note that routine referrals can sometimes take up to 3-4 weeks to process. Please call our office if you haven't heard anything after this time frame. ? ?Take care, ? ?Vance Peper, NP ? ?

## 2022-01-15 NOTE — Assessment & Plan Note (Signed)
Chronic, stable.  Prescription sent in for lidocaine patches to wear 12 hours at a time.  Her gabapentin was also refilled.  She can continue to take it 300 mg, 3-4 times daily as needed.  Follow-up in 2 months ?

## 2022-01-15 NOTE — Assessment & Plan Note (Signed)
Chronic, ongoing.  She was started on Seroquel 50 mg nightly last visit for bipolar disorder.  She states that it has helped some of her symptoms, however she is still having a lot of anxiety.  She has taken Klonopin in the past, she also has a history of suicidal ideation with Lexapro and sertraline.  PDMP reviewed.  Discussed addicting behaviors of Klonopin and that she should not drive while taking it.  Do not take Klonopin and Seroquel at the same time.  Prescription sent for Klonopin 0.5 mg twice daily as needed for 2 weeks.  Reached out to the referral coordinator to follow-up on her referral to psychiatry.  The referral Crader was able to find a psychiatrist who takes her insurance, however the patient needs to call herself as it is only self-referral.  Psychiatry information given and patient instructed to call today as soon as she leaves her appointment.  Follow-up in 6 to 8 weeks or sooner with concerns. ?

## 2022-01-16 ENCOUNTER — Encounter: Payer: Self-pay | Admitting: Nurse Practitioner

## 2022-01-16 ENCOUNTER — Ambulatory Visit (INDEPENDENT_AMBULATORY_CARE_PROVIDER_SITE_OTHER): Payer: 59 | Admitting: Nurse Practitioner

## 2022-01-16 VITALS — BP 126/88 | HR 76 | Temp 97.0°F

## 2022-01-16 DIAGNOSIS — H00011 Hordeolum externum right upper eyelid: Secondary | ICD-10-CM | POA: Diagnosis not present

## 2022-01-16 MED ORDER — ERYTHROMYCIN 5 MG/GM OP OINT: 1.0000 "application " | TOPICAL_OINTMENT | Freq: Three times a day (TID) | OPHTHALMIC | 0 refills | Status: AC

## 2022-01-16 NOTE — Assessment & Plan Note (Addendum)
Acute x1 day.  She states that she popped the stye and had white pus come out of it.  She has been endorsing some intermittent blurry vision.  She has had styes in the past that usually need antibiotics to clear up.  With drainage we will treat with erythromycin ointment 3 times a day for 7 days.  Encouraged her to use warm compresses 4 times a day.  Educated her to not pop the stye if she gets them in the future, and to use artificial eyelashes only once.  If she is having ongoing blurry vision, recommend that she follow-up with ophthalmology. ?

## 2022-01-16 NOTE — Progress Notes (Signed)
? ?Acute Office Visit ? ?Subjective:  ? ? Patient ID: Kristie Thornton, female    DOB: 1990/12/05, 31 y.o.   MRN: [id number] ? ?Chief Complaint  ?Patient presents with  ? Eye Problem  ?  Pt c/o styes in both eyes x1 day.   ? ? ?HPI ?Patient is in today for itching in both eyes eyes with drainage. She popped the stye on her right upper eye lid last night.  She states that there was white pus that came out of her stye.  She endorses blurry vision at times. Her eyes feel irritated, but not pain. She also noted that yesterday she wore artificial eyelashes that she has worn in the past.  She knows that she should throw them out with every use, however she was trying to save money.  She does not wear contacts. ? ?Past Medical History:  ?Diagnosis Date  ? Abscess   ? sub areolar left breast  ? Anxiety   ? Back pain   ? Carpal tunnel syndrome during pregnancy 06/03/2017  ? Chlamydia infection affecting pregnancy, antepartum 03/26/2017  ? April 2018- POS [x]  TOC neg  ? Depression   ? Gestational hypertension 10/08/2017  ? History of IBS   ? PTSD (post-traumatic stress disorder)   ? Suicide and self-inflicted injury [facility])   ? when a teenager  ? Vacuum extractor delivery, delivered 10/06/2017  ? ? ?Past Surgical History:  ?Procedure Laterality Date  ? NO PAST SURGERIES    ? ? ?Family History  ?Problem Relation Age of Onset  ? Cancer Mother   ?     colon cancer  ? Stroke Father   ? Cancer Father 66  ?     testicular cancer  ? Fibromyalgia Sister   ? ADD / ADHD Brother   ? ? ?Social History  ? ?Socioeconomic History  ? Marital status: Divorced  ?  Spouse name: n/a  ? Number of children: 0  ? Years of education: 12th +  ? Highest education level: Not on file  ?Occupational History  ? Occupation: Press photographer  ?  Employer: STUDENT  ?  Comment: [employer]  ?Tobacco Use  ? Smoking status: Former  ?  Types: Cigarettes  ? Smokeless tobacco: Never  ? Tobacco comments:  ?  stopped 02/2017 previously vaped  ?Vaping Use  ? Vaping Use: Every day  ?Substance  and Sexual Activity  ? Alcohol use: No  ?  Alcohol/week: 0.0 standard drinks  ?  Comment: stopped 01/2017 occasional  ? Drug use: No  ?  Types: Marijuana  ?  Comment: 03/12/17 last week ago, trying to stop  ? Sexual activity: Yes  ?  Partners: Male  ?  Birth control/protection: None  ?Other Topics Concern  ? Not on file  ?Social History Narrative  ? Parents are from [place]. Parents and her siblings were born in the [place].  ? Nail tech  ? Marital status: divorced  ?    Children: one daughter  ?   Lives: with duaghter  ?    Employment: nail tech  ?    Tobacco:  None  ?    Alcohol: weekends; no DWIs  ?    Drugs: marijuana socially  ?      ? ?Social Determinants of Health  ? ?Financial Resource Strain: Not on file  ?Food Insecurity: Not on file  ?Transportation Needs: Not on file  ?Physical Activity: Not on file  ?Stress: Not on file  ?Social Connections: Not on  file  ?Intimate Partner Violence: Not on file  ? ? ?Outpatient Medications Prior to Visit  ?Medication Sig Dispense Refill  ? clonazePAM (KLONOPIN) 0.5 MG tablet Take 1 tablet (0.5 mg total) by mouth 2 (two) times daily as needed for anxiety. 28 tablet 0  ? gabapentin (NEURONTIN) 300 MG capsule Take 1 capsule (300 mg total) by mouth 2 (two) times daily. Start 1 capsule at bedtime for 7 days, may increase to twice a day 180 capsule 1  ? lidocaine (LIDODERM) 5 % Place 1 patch onto the skin daily. Remove & Discard patch within 12 hours or as directed by MD 30 patch 3  ? metroNIDAZOLE (METROGEL) 0.75 % vaginal gel Place 1 Applicatorful vaginally 2 (two) times a week. Start after finishing flagyl pills 70 g 5  ? ondansetron (ZOFRAN-ODT) 4 MG disintegrating tablet Take 1 tablet (4 mg total) by mouth every 8 (eight) hours as needed for nausea or vomiting. 20 tablet 1  ? QUEtiapine (SEROQUEL) 50 MG tablet Take 1 tablet (50 mg total) by mouth at bedtime. Start 1/2 tablet at bedtime for 7 days and then take 1 tablet at bedtime 90 tablet 1  ? ?No facility-administered  medications prior to visit.  ? ? ?Allergies  ?Allergen Reactions  ? Lexapro [Escitalopram] Other (See Comments)  ?  suicidal  ? Sertraline Hcl Other (See Comments)  ?  Suicidality  ? ? ?Review of Systems ?See pertinent positives and negatives per HPI. ?   ?Objective:  ?  ?Physical Exam ?Vitals and nursing note reviewed.  ?Constitutional:   ?   General: She is not in acute distress. ?   Appearance: Normal appearance.  ?HENT:  ?   Head: Normocephalic.  ?Eyes:  ?   Conjunctiva/sclera: Conjunctivae normal.  ?   Right eye: Right conjunctiva is not injected.  ?   Left eye: Left conjunctiva is not injected.  ?   Comments: Stye noted to right upper eyelid  ?Pulmonary:  ?   Effort: Pulmonary effort is normal.  ?Musculoskeletal:  ?   Cervical back: Normal range of motion.  ?Skin: ?   General: Skin is warm.  ?Neurological:  ?   General: No focal deficit present.  ?   Mental Status: She is alert and oriented to person, place, and time.  ?Psychiatric:     ?   Mood and Affect: Mood normal.     ?   Behavior: Behavior normal.     ?   Thought Content: Thought content normal.     ?   Judgment: Judgment normal.  ? ? ?BP 126/88 (BP Location: Left Arm, Patient Position: Sitting, Cuff Size: Normal)   Pulse 76   Temp (!) 97 ?F (36.1 ?C) (Temporal)   SpO2 100%  ?Wt Readings from Last 3 Encounters:  ?01/15/22 119 lb (54 kg)  ?12/10/21 129 lb 6.4 oz (58.7 kg)  ?11/20/21 124 lb 6.4 oz (56.4 kg)  ? ? ?Health Maintenance Due  ?Topic Date Due  ? COVID-19 Vaccine (1) Never done  ? PAP SMEAR-Modifier  02/26/2019  ? ? ?There are no preventive care reminders to display for this patient. ? ? ?Lab Results  ?Component Value Date  ? TSH 2.33 01/17/2016  ? ?Lab Results  ?Component Value Date  ? WBC 14.7 (H) 10/05/2017  ? HGB 12.3 10/05/2017  ? HCT 36.8 10/05/2017  ? MCV 101.7 (H) 10/05/2017  ? PLT 245 10/05/2017  ? ?Lab Results  ?Component Value Date  ? NA 135 10/04/2017  ?  K 3.2 (L) 10/04/2017  ? CO2 20 (L) 10/04/2017  ? GLUCOSE 111 (H) 10/04/2017  ?  BUN 7 10/04/2017  ? CREATININE 0.37 (L) 10/04/2017  ? BILITOT 0.3 10/04/2017  ? ALKPHOS 137 (H) 10/04/2017  ? AST 16 10/04/2017  ? ALT 10 (L) 10/04/2017  ? PROT 6.7 10/04/2017  ? ALBUMIN 3.3 (L) 10/04/2017  ? CALCIUM 8.9 10/04/2017  ? ANIONGAP 10 10/04/2017  ? ?Lab Results  ?Component Value Date  ? CHOL 282 (H) 09/21/2014  ? ?Lab Results  ?Component Value Date  ? HDL 65 09/21/2014  ? ?Lab Results  ?Component Value Date  ? LDLCALC 203 (H) 09/21/2014  ? ?Lab Results  ?Component Value Date  ? TRIG 68 09/21/2014  ? ?Lab Results  ?Component Value Date  ? CHOLHDL 4.3 09/21/2014  ? ?No results found for: HGBA1C ? ?   ?Assessment & Plan:  ? ?Problem List Items Addressed This Visit   ? ?  ? Other  ? Hordeolum externum of right upper eyelid - Primary  ?  Acute x1 day.  She states that she popped the stye and had white pus come out of it.  She has been endorsing some intermittent blurry vision.  She has had styes in the past that usually need antibiotics to clear up.  With drainage we will treat with erythromycin ointment 3 times a day for 7 days.  Encouraged her to use warm compresses 4 times a day.  Educated her to not pop the stye if she gets them in the future, and to use artificial eyelashes only once.  If she is having ongoing blurry vision, recommend that she follow-up with ophthalmology. ?  ?  ? ? ? ?Meds ordered this encounter  ?Medications  ? erythromycin ophthalmic ointment  ?  Sig: Place 1 application. into both eyes 3 (three) times daily for 7 days.  ?  Dispense:  21 g  ?  Refill:  0  ? ? ? ?Charyl Dancer, NP ? ?

## 2022-01-17 ENCOUNTER — Telehealth: Payer: Self-pay | Admitting: Nurse Practitioner

## 2022-01-17 MED ORDER — POLYMYXIN B-TRIMETHOPRIM 10000-0.1 UNIT/ML-% OP SOLN: 1.0000 [drp] | Freq: Four times a day (QID) | OPHTHALMIC | 0 refills | Status: DC

## 2022-01-17 NOTE — Telephone Encounter (Signed)
Pt is wanting her erythromycin ophthalmic ointment [[id number]] changed to liquid form. ? ?Pharmacy ? ?[facility] #[id number] [place], [place] - [address] AT [facility]  ?[address], [place] [address]  ?Phone:  [phone]  Fax:  [phone]  ?DEA #:  [id number]  ? ? ?

## 2022-01-18 MED ORDER — POLYMYXIN B-TRIMETHOPRIM 10000-0.1 UNIT/ML-% OP SOLN: 1.0000 [drp] | Freq: Four times a day (QID) | OPHTHALMIC | 0 refills | Status: DC

## 2022-01-18 NOTE — Telephone Encounter (Signed)
EHU:[id number] called and they do not have this script. She called other pharmacies, this script is on backorder with no send by date. Please send another medication over. ? ?Pharmacy ?  ?[facility] #[id number] [place], [place] - [address] AT [facility]  ?[address], [place] [address]  ?Phone:  [phone]  Fax:  [phone]  ?DEA #:  [id number]  ? ?

## 2022-01-18 NOTE — Addendum Note (Signed)
Addended by: Willaim Bane on: 01/18/2022 05:00 PM ? ? Modules accepted: Orders ? ?

## 2022-01-18 NOTE — Telephone Encounter (Signed)
Pt's insurance will not cover this script, she is wanting it sent tPharmacy ? ?[facility] #[id number] [place],  - [address] AT [facility]  ?[address],  [place] [address]  ?Phone:  [phone]  Fax:  [phone]  ?DEA #:  [id number]  ? ?

## 2022-01-18 NOTE — Telephone Encounter (Signed)
Pt notified via mychart

## 2022-01-18 NOTE — Addendum Note (Signed)
Addended by: Willaim Bane on: 01/18/2022 11:42 AM ? ? Modules accepted: Orders ? ?

## 2022-01-18 NOTE — Telephone Encounter (Signed)
Returned pt phone call. Prescription resent to [id number] - [place], [place] ?

## 2022-01-18 NOTE — Telephone Encounter (Signed)
Rx prescription resent to [facility] [address] - [place], [place]. AT [place]

## 2022-02-01 ENCOUNTER — Encounter: Payer: Self-pay | Admitting: Nurse Practitioner

## 2022-02-04 ENCOUNTER — Telehealth: Payer: Self-pay | Admitting: Nurse Practitioner

## 2022-02-04 MED ORDER — GABAPENTIN 600 MG PO TABS: ORAL_TABLET | ORAL | 2 refills | Status: DC

## 2022-02-04 NOTE — Telephone Encounter (Signed)
Pt is checking on status of Patient Message from 02/01/22. She has concerns about her meds. Please advise pt at [phone] ?

## 2022-02-05 MED ORDER — CLONAZEPAM 0.5 MG PO TABS: 0.5000 mg | ORAL_TABLET | Freq: Two times a day (BID) | ORAL | 0 refills | Status: DC | PRN

## 2022-02-05 NOTE — Addendum Note (Signed)
Addended by: Rodman Pickle A on: 02/05/2022 04:29 PM ? ? Modules accepted: Orders ? ?

## 2022-02-07 ENCOUNTER — Other Ambulatory Visit: Payer: Self-pay | Admitting: Nurse Practitioner

## 2022-02-19 ENCOUNTER — Other Ambulatory Visit (HOSPITAL_COMMUNITY)
Admission: RE | Admit: 2022-02-19 | Discharge: 2022-02-19 | Disposition: A | Discharge status: Home / Self Care | Payer: 59 | Source: Ambulatory Visit | Attending: Nurse Practitioner | Admitting: Nurse Practitioner

## 2022-02-19 ENCOUNTER — Ambulatory Visit (INDEPENDENT_AMBULATORY_CARE_PROVIDER_SITE_OTHER): Payer: 59 | Admitting: Nurse Practitioner

## 2022-02-19 ENCOUNTER — Encounter: Payer: Self-pay | Admitting: Nurse Practitioner

## 2022-02-19 ENCOUNTER — Ambulatory Visit (INDEPENDENT_AMBULATORY_CARE_PROVIDER_SITE_OTHER): Payer: 59

## 2022-02-19 VITALS — BP 94/68 | HR 95 | Temp 98.0°F | Wt 117.6 lb

## 2022-02-19 DIAGNOSIS — R1032 Left lower quadrant pain: Secondary | ICD-10-CM

## 2022-02-19 DIAGNOSIS — K581 Irritable bowel syndrome with constipation: Secondary | ICD-10-CM

## 2022-02-19 LAB — POCT URINALYSIS DIPSTICK
Bilirubin, UA: NEGATIVE
Blood, UA: NEGATIVE
Glucose, UA: NEGATIVE
Ketones, UA: NEGATIVE
Leukocytes, UA: NEGATIVE
Nitrite, UA: NEGATIVE
Protein, UA: POSITIVE — AB
Spec Grav, UA: 1.015 (ref 1.010–1.025)
Urobilinogen, UA: 0.2 E.U./dL
pH, UA: 7.5 (ref 5.0–8.0)

## 2022-02-19 LAB — POCT URINE PREGNANCY: Preg Test, Ur: NEGATIVE

## 2022-02-19 LAB — CBC WITH DIFFERENTIAL/PLATELET
Basophils Absolute: 0 10*3/uL (ref 0.0–0.1)
Basophils Relative: 0.3 % (ref 0.0–3.0)
Eosinophils Absolute: 0.1 10*3/uL (ref 0.0–0.7)
Eosinophils Relative: 2.8 % (ref 0.0–5.0)
HCT: 39.9 % (ref 36.0–46.0)
Hemoglobin: 13.3 g/dL (ref 12.0–15.0)
Lymphocytes Relative: 37.1 % (ref 12.0–46.0)
Lymphs Abs: 1.6 10*3/uL (ref 0.7–4.0)
MCHC: 33.4 g/dL (ref 30.0–36.0)
MCV: 98.9 fl (ref 78.0–100.0)
Monocytes Absolute: 0.3 10*3/uL (ref 0.1–1.0)
Monocytes Relative: 6.3 % (ref 3.0–12.0)
Neutro Abs: 2.3 10*3/uL (ref 1.4–7.7)
Neutrophils Relative %: 53.5 % (ref 43.0–77.0)
Platelets: 278 10*3/uL (ref 150.0–400.0)
RBC: 4.03 Mil/uL (ref 3.87–5.11)
RDW: 13.2 % (ref 11.5–15.5)
WBC: 4.4 10*3/uL (ref 4.0–10.5)

## 2022-02-19 LAB — COMPREHENSIVE METABOLIC PANEL
ALT: 12 U/L (ref 0–35)
AST: 17 U/L (ref 0–37)
Albumin: 4.6 g/dL (ref 3.5–5.2)
Alkaline Phosphatase: 41 U/L (ref 39–117)
BUN: 6 mg/dL (ref 6–23)
CO2: 28 mEq/L (ref 19–32)
Calcium: 9.4 mg/dL (ref 8.4–10.5)
Chloride: 101 mEq/L (ref 96–112)
Creatinine, Ser: 0.66 mg/dL (ref 0.40–1.20)
GFR: 117.58 mL/min (ref >60.00)
Glucose, Bld: 95 mg/dL (ref 70–99)
Potassium: 4.1 mEq/L (ref 3.5–5.1)
Sodium: 138 mEq/L (ref 135–145)
Total Bilirubin: 0.3 mg/dL (ref 0.2–1.2)
Total Protein: 7.6 g/dL (ref 6.0–8.3)

## 2022-02-19 MED ORDER — POLYETHYLENE GLYCOL 3350 17 GM/SCOOP PO POWD: 17.0000 g | Freq: Every day | ORAL | 0 refills | Status: DC | PRN

## 2022-02-19 NOTE — Progress Notes (Signed)
? ?Acute Office Visit ? ?Subjective:  ? ?  ?Patient ID: Kristie Thornton, female    DOB: June 07, 1991, 31 y.o.   MRN: [id number] ? ?Chief Complaint  ?Patient presents with  ? Constipation  ?  Pt c/o constipation w/ abd pain and pelvic inflammation x1 wk  ? ? ?HPI ?Patient is in today for LLQ abdominal pain. She has a history of ovarian cysts. She also hasn't had a bowel movement in 1 week. She had been referred to GI in the past, however missed the appointment. She is currently on depo provera.  ? ?ABDOMINAL PAIN  ? ?Duration:weeks ?Onset: sudden ?Severity: 6/10 ?Quality: throbbing, sharp ?Location:  LLQ  ?Episode duration:  ?Radiation: no ?Frequency: constant ?Alleviating factors: rubbing it, heat ?Aggravating factors: unsure ?Status: worse ?Treatments attempted: mineral oil, docusate, enema, fluid diet ?Fever: no - hot and cold sweats ?Nausea: yes ?Vomiting: yes ?Weight loss: no ?Decreased appetite: yes ?Diarrhea: no ?Constipation: yes ?Blood in stool: no ?Heartburn: no ?Jaundice: yes  - someone told her she looked yellow the other day ?Rash: no ?Dysuria/urinary frequency: yes ?Hematuria: no ?History of sexually transmitted disease: yes ?Recurrent NSAID use: no ? ? ?ROS ?See pertinent positives and negatives per HPI. ? ?   ?Objective:  ?  ?BP 94/68 (BP Location: Left Arm, Patient Position: Sitting, Cuff Size: Normal)   Pulse 95   Temp 98 ?F (36.7 ?C) (Temporal)   Wt 117 lb 9.6 oz (53.3 kg)   SpO2 99%   BMI 21.86 kg/m?  ?BP Readings from Last 3 Encounters:  ?02/19/22 94/68  ?01/16/22 126/88  ?01/15/22 118/90  ? ?  ?Physical Exam ?Vitals and nursing note reviewed.  ?Constitutional:   ?   General: She is not in acute distress. ?   Appearance: Normal appearance.  ?HENT:  ?   Head: Normocephalic.  ?Eyes:  ?   Conjunctiva/sclera: Conjunctivae normal.  ?Cardiovascular:  ?   Rate and Rhythm: Normal rate and regular rhythm.  ?   Pulses: Normal pulses.  ?   Heart sounds: Normal heart sounds.  ?Pulmonary:  ?   Effort: Pulmonary  effort is normal.  ?   Breath sounds: Normal breath sounds.  ?Abdominal:  ?   Palpations: Abdomen is soft.  ?   Tenderness: There is abdominal tenderness in the left lower quadrant. There is no guarding. Negative signs include Murphy's sign, Rovsing's sign and McBurney's sign.  ?Musculoskeletal:  ?   Cervical back: Normal range of motion.  ?Skin: ?   General: Skin is warm.  ?Neurological:  ?   General: No focal deficit present.  ?   Mental Status: She is alert and oriented to person, place, and time.  ?Psychiatric:     ?   Mood and Affect: Mood normal.     ?   Behavior: Behavior normal.     ?   Thought Content: Thought content normal.     ?   Judgment: Judgment normal.  ? ? ?Results for orders placed or performed in visit on 02/19/22  ?Comprehensive metabolic panel  ?Result Value Ref Range  ? Sodium 138 135 - 145 mEq/L  ? Potassium 4.1 3.5 - 5.1 mEq/L  ? Chloride 101 96 - 112 mEq/L  ? CO2 28 19 - 32 mEq/L  ? Glucose, Bld 95 70 - 99 mg/dL  ? BUN 6 6 - 23 mg/dL  ? Creatinine, Ser 0.66 0.40 - 1.20 mg/dL  ? Total Bilirubin 0.3 0.2 - 1.2 mg/dL  ? Alkaline Phosphatase 41 39 -  117 U/L  ? AST 17 0 - 37 U/L  ? ALT 12 0 - 35 U/L  ? Total Protein 7.6 6.0 - 8.3 g/dL  ? Albumin 4.6 3.5 - 5.2 g/dL  ? GFR 117.58 >60.00 mL/min  ? Calcium 9.4 8.4 - 10.5 mg/dL  ?CBC with Differential/Platelet  ?Result Value Ref Range  ? WBC 4.4 4.0 - 10.5 K/uL  ? RBC 4.03 3.87 - 5.11 Mil/uL  ? Hemoglobin 13.3 12.0 - 15.0 g/dL  ? HCT 39.9 36.0 - 46.0 %  ? MCV 98.9 78.0 - 100.0 fl  ? MCHC 33.4 30.0 - 36.0 g/dL  ? RDW 13.2 11.5 - 15.5 %  ? Platelets 278.0 150.0 - 400.0 K/uL  ? Neutrophils Relative % 53.5 43.0 - 77.0 %  ? Lymphocytes Relative 37.1 12.0 - 46.0 %  ? Monocytes Relative 6.3 3.0 - 12.0 %  ? Eosinophils Relative 2.8 0.0 - 5.0 %  ? Basophils Relative 0.3 0.0 - 3.0 %  ? Neutro Abs 2.3 1.4 - 7.7 K/uL  ? Lymphs Abs 1.6 0.7 - 4.0 K/uL  ? Monocytes Absolute 0.3 0.1 - 1.0 K/uL  ? Eosinophils Absolute 0.1 0.0 - 0.7 K/uL  ? Basophils Absolute 0.0 0.0 -  0.1 K/uL  ?POCT urine pregnancy  ?Result Value Ref Range  ? Preg Test, Ur Negative Negative  ?POCT urinalysis dipstick  ?Result Value Ref Range  ? Color, UA yellow   ? Clarity, UA slightly cloudy   ? Glucose, UA Negative Negative  ? Bilirubin, UA negative   ? Ketones, UA negative   ? Spec Grav, UA 1.015 1.010 - 1.025  ? Blood, UA negative   ? pH, UA 7.5 5.0 - 8.0  ? Protein, UA Positive (A) Negative  ? Urobilinogen, UA 0.2 0.2 or 1.0 E.U./dL  ? Nitrite, UA negative   ? Leukocytes, UA Negative Negative  ? Appearance    ? Odor    ? ?   ?Assessment & Plan:  ? ?Problem List Items Addressed This Visit   ? ?  ? Digestive  ? Irritable bowel syndrome with constipation  ?  Chronic, ongoing. She states that she has had trouble with constipation since she was 31 years old. Most recently, she has not had a bowel movement in a week. She took a laxative and used an enema and had a bowel movement on Monday, then she had another bowel movement today. Will have her start miralax 17g daily. Encouraged her to eat foods high in fiber and drink plenty of water. Will place referral to GI.  ? ?  ?  ? Relevant Medications  ? polyethylene glycol powder (GLYCOLAX/MIRALAX) 17 GM/SCOOP powder  ? Other Relevant Orders  ? Ambulatory referral to Gastroenterology  ?  ? Other  ? LLQ abdominal pain - Primary  ?  LLQ abdominal pain for 1 week with associated nausea and vomiting. She has a history of IBS with constipation and ovarian cysts. The pain on assessment is not in pelvic region, although is in LLQ. She states she has not had a bowel movement in a week, so Sunday night she took a laxative and did an enema and had a BM on Monday, then she also had one today. Will check an x-ray of her abdomen today, check CMP, CBC. U/A and urine pregnancy negative. Will also check wet prep for BV, yeast, and gonorrhea/chlamydia. Start miralax 17g daily and drink plenty of fluid. Referral placed to GI. Follow-up in 2 weeks. ER precautions discussed.  ? ?  ?  ?  Relevant Orders  ? POCT urine pregnancy (Completed)  ? POCT urinalysis dipstick (Completed)  ? Comprehensive metabolic panel (Completed)  ? CBC with Differential/Platelet (Completed)  ? Cervicovaginal ancillary only  ? Ambulatory referral to Gastroenterology  ? DG Abd 2 Views  ? ? ?Meds ordered this encounter  ?Medications  ? polyethylene glycol powder (GLYCOLAX/MIRALAX) 17 GM/SCOOP powder  ?  Sig: Take 17 g by mouth daily as needed.  ?  Dispense:  3350 g  ?  Refill:  0  ? ? ?Return in about 2 weeks (around 03/05/2022) for abdominal. ? ?Gerre Scull, NP ? ? ?

## 2022-02-19 NOTE — Assessment & Plan Note (Signed)
Chronic, ongoing. She states that she has had trouble with constipation since she was 31 years old. Most recently, she has not had a bowel movement in a week. She took a laxative and used an enema and had a bowel movement on Monday, then she had another bowel movement today. Will have her start miralax 17g daily. Encouraged her to eat foods high in fiber and drink plenty of water. Will place referral to GI.  ?

## 2022-02-19 NOTE — Assessment & Plan Note (Addendum)
LLQ abdominal pain for 1 week with associated nausea and vomiting. She has a history of IBS with constipation and ovarian cysts. The pain on assessment is not in pelvic region, although is in LLQ. She states she has not had a bowel movement in a week, so Sunday night she took a laxative and did an enema and had a BM on Monday, then she also had one today. Will check an x-ray of her abdomen today, check CMP, CBC. U/A and urine pregnancy negative. Will also check wet prep for BV, yeast, and gonorrhea/chlamydia. Start miralax 17g daily and drink plenty of fluid. Referral placed to GI. Follow-up in 2 weeks. ER precautions discussed.  ?

## 2022-02-19 NOTE — Patient Instructions (Signed)
It was great to see you! ? ?Start miralax 1 cap full daily. I am also putting in a referral to GI.  ? ?Let's follow-up in 2 weeks, sooner if you have concerns. ? ?If a referral was placed today, you will be contacted for an appointment. Please note that routine referrals can sometimes take up to 3-4 weeks to process. Please call our office if you haven't heard anything after this time frame. ? ?Take care, ? ?Rodman Pickle, NP ? ?

## 2022-02-20 ENCOUNTER — Encounter: Payer: Self-pay | Admitting: Nurse Practitioner

## 2022-02-20 LAB — CERVICOVAGINAL ANCILLARY ONLY
Bacterial Vaginitis (gardnerella): NEGATIVE
Candida Glabrata: NEGATIVE
Candida Vaginitis: NEGATIVE
Chlamydia: NEGATIVE
Comment: NEGATIVE
Comment: NEGATIVE
Comment: NEGATIVE
Comment: NEGATIVE
Comment: NEGATIVE
Comment: NORMAL
Neisseria Gonorrhea: NEGATIVE
Trichomonas: NEGATIVE

## 2022-02-28 ENCOUNTER — Encounter: Payer: Self-pay | Admitting: Gastroenterology

## 2022-03-05 ENCOUNTER — Ambulatory Visit (INDEPENDENT_AMBULATORY_CARE_PROVIDER_SITE_OTHER): Payer: 59 | Admitting: Gastroenterology

## 2022-03-05 ENCOUNTER — Encounter: Payer: Self-pay | Admitting: Gastroenterology

## 2022-03-05 VITALS — BP 100/78 | HR 97 | Ht 60.0 in | Wt 117.0 lb

## 2022-03-05 DIAGNOSIS — K5909 Other constipation: Secondary | ICD-10-CM

## 2022-03-05 DIAGNOSIS — R1031 Right lower quadrant pain: Secondary | ICD-10-CM | POA: Diagnosis not present

## 2022-03-05 DIAGNOSIS — K625 Hemorrhage of anus and rectum: Secondary | ICD-10-CM | POA: Diagnosis not present

## 2022-03-05 DIAGNOSIS — K581 Irritable bowel syndrome with constipation: Secondary | ICD-10-CM

## 2022-03-05 DIAGNOSIS — R12 Heartburn: Secondary | ICD-10-CM

## 2022-03-05 DIAGNOSIS — G8929 Other chronic pain: Secondary | ICD-10-CM

## 2022-03-05 DIAGNOSIS — R1114 Bilious vomiting: Secondary | ICD-10-CM

## 2022-03-05 DIAGNOSIS — R1032 Left lower quadrant pain: Secondary | ICD-10-CM

## 2022-03-05 DIAGNOSIS — R630 Anorexia: Secondary | ICD-10-CM

## 2022-03-05 MED ORDER — HYOSCYAMINE SULFATE 0.125 MG SL SUBL: 0.1250 mg | SUBLINGUAL_TABLET | SUBLINGUAL | 2 refills | Status: DC | PRN

## 2022-03-05 MED ORDER — PROMETHAZINE HCL 12.5 MG PO TABS: 12.5000 mg | ORAL_TABLET | Freq: Three times a day (TID) | ORAL | 2 refills | Status: DC | PRN

## 2022-03-05 NOTE — Telephone Encounter (Signed)
Medication sent in by Mansouraty, Netty Starring., MD today.  ?

## 2022-03-05 NOTE — Patient Instructions (Addendum)
Sample of Linzess given to you today. Send my chart message in 2 weeks letting us know how your doing. ? ?We have sent the following medications to your pharmacy for you to pick up at your convenience: ?Hyoscyamine, Promethazine  ? ?Follow up on: 05/02/22 at 3:30pm ? ?Thank you for choosing me and [facility]. ? ?Dr. Meridee Score ?  ? ? ?

## 2022-03-05 NOTE — Progress Notes (Signed)
? ?GASTROENTEROLOGY OUTPATIENT CLINIC VISIT  ? ?Primary Care Provider ?Gerre Scull, NP ?[phone] ?[place] [address] ?[phone] ? ?Referring Provider ?Gerre Scull, NP ?[phone] ?[place],  [place] [address] ?[phone] ? ?Patient Profile: ?Kristie Thornton is a 31 y.o. female with a pmh significant for anxiety, MDD, ?IBS-C.  The patient presents to the [facility] for an evaluation and management of problem(s) noted below: ? ?Problem List ?1. Irritable bowel syndrome with constipation   ?2. Chronic constipation   ?3. Chronic bilateral lower abdominal pain   ?4. Rectal bleeding   ?5. Bilious vomiting with nausea   ?6. Decreased appetite   ?7. Pyrosis   ? ? ?History of Present Illness ?This is the patient's first visit to the outpatient [facility].  The patient states that she has been experiencing issues of GI/abdominal pain since the age of 41.  Since that time, she has experienced issues with constipation.  She states she has previously seen a provider who had given her hyoscyamine and another medication that helped with moving her bowels.  She has not been on hyoscyamine for quite a while.  She states she has a bowel movement every 4 to 5 days and sometimes can go upwards of 10 days without a bowel movement.  As the days progressed, she begins to experience more significant lower abdominal discomfort and cramping.  She becomes more bloated.  In the past she has trialed MiraLAX without success.  She will use Dulcolax on a daily or every other day basis and if she uses too frequently she will have diarrhea.  She has used milk of magnesia in the past.  She is never needed an enema.  She has had 2 do fecal disimpaction via digital intubation.  After she has a bowel movement she does have relief for a period in time but then things start occurring again.  She has progressive nausea and vomiting if she goes more than 5 days without a bowel movement.  She  has noted flecks or specks of blood in her stool but this is not a regular finding.  She believes that she has irritable bowel syndrome.  Her mother has a history of colon polyps and her sister deals with pancreas and liver issues and GI issues.  She does not take significant nonsteroidals or BC/Goody powders.  She has never had an upper or lower endoscopy. ? ?GI Review of Systems ?Positive as above including infrequent episodes of pyrosis ?Negative for dysphagia, odynophagia, melena ? ?Review of Systems ?General: Denies fevers/chills/weight loss unintentionally ?HEENT: Denies oral lesions ?Cardiovascular: Denies chest pain ?Pulmonary: Denies shortness of breath ?Gastroenterological: See HPI ?Genitourinary: Denies darkened urine ?Hematological: Denies easy bruising/bleeding ?Endocrine: Denies temperature intolerance ?Dermatological: Denies jaundice ?Psychological: Mood is stable ? ? ?Medications ?Current Outpatient Medications  ?Medication Sig Dispense Refill  ? gabapentin (NEURONTIN) 600 MG tablet Take 1 tablet in the morning, 1 tablet in the afternoon, and 1.5 tablets at bedtime 105 tablet 2  ? hyoscyamine (LEVSIN SL) 0.125 MG SL tablet Place 1 tablet (0.125 mg total) under the tongue every 4 (four) hours as needed. 60 tablet 2  ? ibuprofen (ADVIL) 800 MG tablet Take 800 mg by mouth 3 (three) times daily.    ? polyethylene glycol powder (GLYCOLAX/MIRALAX) 17 GM/SCOOP powder Take 17 g by mouth daily as needed. 3350 g 0  ? promethazine (PHENERGAN) 12.5 MG tablet Take 1 tablet (12.5 mg total) by mouth every 8 (eight) hours as  needed for nausea or vomiting. 30 tablet 2  ? QUEtiapine (SEROQUEL) 50 MG tablet Take 1 tablet (50 mg total) by mouth at bedtime. Start 1/2 tablet at bedtime for 7 days and then take 1 tablet at bedtime 90 tablet 1  ? trimethoprim-polymyxin b (POLYTRIM) ophthalmic solution Place 1 drop into the right eye every 6 (six) hours. 10 mL 0  ? [START ON 03/07/2022] clonazePAM (KLONOPIN) 0.5 MG tablet  Take 1 tablet (0.5 mg total) by mouth 2 (two) times daily as needed for anxiety. 60 tablet 0  ? ?No current facility-administered medications for this visit.  ? ? ?Allergies ?Allergies  ?Allergen Reactions  ? Lexapro [Escitalopram] Other (See Comments)  ?  suicidal  ? Sertraline Hcl Other (See Comments)  ?  Suicidality  ? ? ?Histories ?Past Medical History:  ?Diagnosis Date  ? Abscess   ? sub areolar left breast  ? Anxiety   ? Back pain   ? Carpal tunnel syndrome during pregnancy 06/03/2017  ? Chlamydia infection affecting pregnancy, antepartum 03/26/2017  ? April 2018- POS [x]  TOC neg  ? Depression   ? Gestational hypertension 10/08/2017  ? History of IBS   ? PTSD (post-traumatic stress disorder)   ? Suicide and self-inflicted injury [facility])   ? when a teenager  ? Vacuum extractor delivery, delivered 10/06/2017  ? ?Past Surgical History:  ?Procedure Laterality Date  ? NO PAST SURGERIES    ? ?Social History  ? ?Socioeconomic History  ? Marital status: Divorced  ?  Spouse name: n/a  ? Number of children: 0  ? Years of education: 12th +  ? Highest education level: Not on file  ?Occupational History  ? Occupation: 13  ?  Employer: STUDENT  ?  Comment: [employer]  ?Tobacco Use  ? Smoking status: Former  ?  Types: Cigarettes  ? Smokeless tobacco: Never  ? Tobacco comments:  ?  stopped 02/2017 previously vaped  ?Vaping Use  ? Vaping Use: Every day  ?Substance and Sexual Activity  ? Alcohol use: No  ?  Alcohol/week: 0.0 standard drinks  ?  Comment: stopped 01/2017 occasional  ? Drug use: No  ?  Types: Marijuana  ?  Comment: 03/12/17 last week ago, trying to stop  ? Sexual activity: Yes  ?  Partners: Male  ?  Birth control/protection: None  ?Other Topics Concern  ? Not on file  ?Social History Narrative  ? Parents are from 03[place]14[place]17. Parents and her siblings were born in the [place].  ? Nail tech  ? Marital status: divorced  ?    Children: one daughter  ?   Lives: with duaghter  ?    Employment: nail tech  ?    Tobacco:  None  ?     Alcohol: weekends; no DWIs  ?    Drugs: marijuana socially  ?      ? ?Social Determinants of Health  ? ?Financial Resource Strain: Not on file  ?Food Insecurity: Not on file  ?Transportation Needs: Not on file  ?Physical Activity: Not on file  ?Stress: Not on file  ?Social Connections: Not on file  ?Intimate Partner Violence: Not on file  ? ?Family History  ?Problem Relation Age of Onset  ? Cancer Mother   ?     colon cancer  ? Colon polyps Mother   ? Stroke Father   ? Cancer Father 22  ?     testicular cancer  ? Liver disease Sister   ? Fibromyalgia Sister   ?  Pancreatic disease Sister   ? ADD / ADHD Brother   ? Colon cancer Neg Hx   ? Esophageal cancer Neg Hx   ? Inflammatory bowel disease Neg Hx   ? Pancreatic cancer Neg Hx   ? Rectal cancer Neg Hx   ? Stomach cancer Neg Hx   ? ?I have reviewed her medical, social, and family history in detail and updated the electronic medical record as necessary.  ? ? ?PHYSICAL EXAMINATION  ?BP 100/78   Pulse 97   Ht 5' (1.524 m)   Wt 117 lb (53.1 kg)   BMI 22.85 kg/m?  ?Wt Readings from Last 3 Encounters:  ?03/05/22 117 lb (53.1 kg)  ?02/19/22 117 lb 9.6 oz (53.3 kg)  ?01/15/22 119 lb (54 kg)  ?GEN: NAD, appears stated age, doesn't appear chronically ill ?PSYCH: Cooperative, without pressured speech ?EYE: Conjunctivae pink, sclerae anicteric ?ENT: MMM ?CV: RR without R/Gs  ?RESP: CTAB posteriorly, without wheezing ?GI: NABS, soft, slightly protuberant lower abdomen, TTP in bilateral lower quadrants, without rebound or guarding, no HSM appreciated ?GU: DRE deferred by patient ?MSK/EXT: No lower extremity edema ?SKIN: No jaundice ?NEURO:  Alert & Oriented x 3, no focal deficits ? ? ?REVIEW OF DATA  ?I reviewed the following data at the time of this encounter: ? ?GI Procedures and Studies  ?No relevant studies to review ? ?Laboratory Studies  ?Reviewed those in epic ? ?Imaging Studies  ?KUB ?IMPRESSION: ?1. Mild fecal retention.  No bowel obstruction or ileus. ? ?2015  Colonoscopy ?IMPRESSION:  ?1. Suspected bilateral punctate adnexal cysts, right greater than  ?left, with trace amount of presumably physiologic fluid within the  ?endometrial canal and pelvic cul-de-sac. Otherwise, no ex

## 2022-03-06 ENCOUNTER — Telehealth: Payer: Self-pay | Admitting: Gastroenterology

## 2022-03-06 ENCOUNTER — Encounter: Payer: Self-pay | Admitting: Gastroenterology

## 2022-03-06 ENCOUNTER — Other Ambulatory Visit: Payer: Self-pay | Admitting: Nurse Practitioner

## 2022-03-06 DIAGNOSIS — K5909 Other constipation: Secondary | ICD-10-CM | POA: Insufficient documentation

## 2022-03-06 DIAGNOSIS — R630 Anorexia: Secondary | ICD-10-CM | POA: Insufficient documentation

## 2022-03-06 DIAGNOSIS — R12 Heartburn: Secondary | ICD-10-CM | POA: Insufficient documentation

## 2022-03-06 DIAGNOSIS — R1114 Bilious vomiting: Secondary | ICD-10-CM | POA: Insufficient documentation

## 2022-03-06 DIAGNOSIS — G8929 Other chronic pain: Secondary | ICD-10-CM | POA: Insufficient documentation

## 2022-03-06 DIAGNOSIS — K625 Hemorrhage of anus and rectum: Secondary | ICD-10-CM | POA: Insufficient documentation

## 2022-03-06 MED ORDER — CLONAZEPAM 0.5 MG PO TABS
0.5000 mg | ORAL_TABLET | Freq: Two times a day (BID) | ORAL | 0 refills | Status: DC | PRN
Start: 2022-03-07 — End: 2022-04-26

## 2022-03-06 NOTE — Telephone Encounter (Signed)
Patient called regarding Hyoscyamine medication. Said she does not want the dissolve tablets.Please advise. ?

## 2022-03-08 ENCOUNTER — Other Ambulatory Visit: Payer: Self-pay

## 2022-03-08 MED ORDER — HYOSCYAMINE SULFATE 0.125 MG PO TABS: 0.1250 mg | ORAL_TABLET | ORAL | 0 refills | Status: DC | PRN

## 2022-03-08 NOTE — Telephone Encounter (Signed)
New Rx has been sent per instructions from Dr Rush Landmark  ?

## 2022-03-08 NOTE — Telephone Encounter (Signed)
Dr Meridee Score please advise on substitution  ? ?

## 2022-03-09 ENCOUNTER — Other Ambulatory Visit: Payer: Self-pay | Admitting: Gastroenterology

## 2022-04-03 ENCOUNTER — Ambulatory Visit (INDEPENDENT_AMBULATORY_CARE_PROVIDER_SITE_OTHER): Payer: 59 | Admitting: Nurse Practitioner

## 2022-04-03 ENCOUNTER — Encounter: Payer: Self-pay | Admitting: Nurse Practitioner

## 2022-04-03 VITALS — BP 96/70 | HR 74 | Temp 96.8°F | Wt 114.6 lb

## 2022-04-03 DIAGNOSIS — R310 Gross hematuria: Secondary | ICD-10-CM

## 2022-04-03 LAB — POCT URINALYSIS DIPSTICK
Bilirubin, UA: NEGATIVE
Blood, UA: NEGATIVE
Glucose, UA: NEGATIVE
Ketones, UA: NEGATIVE
Leukocytes, UA: NEGATIVE
Nitrite, UA: NEGATIVE
Protein, UA: POSITIVE — AB
Spec Grav, UA: 1.02 (ref 1.010–1.025)
Urobilinogen, UA: NEGATIVE E.U./dL — AB
pH, UA: 6.5 (ref 5.0–8.0)

## 2022-04-03 MED ORDER — NITROFURANTOIN MONOHYD MACRO 100 MG PO CAPS: 100.0000 mg | ORAL_CAPSULE | Freq: Two times a day (BID) | ORAL | 0 refills | Status: DC

## 2022-04-03 NOTE — Assessment & Plan Note (Signed)
Acute urinary freqeuncy, pressure and pink urine when wiping for the last few days. U/A positive for protein, trace leukocytes. Will add urine culture. Treat for UTI with macrobid 100mg  BID x 5 days. Drink plenty of fluids. Follow-up if not improving.

## 2022-04-03 NOTE — Progress Notes (Signed)
Acute Office Visit  Subjective:     Patient ID: Kristie Thornton, female    DOB: [birth year], 30 y.o.   MRN: [id number]  Chief Complaint  Patient presents with   Hematuria    Pt c/o blood in urine, bladder pressure and feeling of not fully emptying x1 wk     HPI Patient is in today for bladder pressure, pink color urine, and cloudy.  URINARY SYMPTOMS  Dysuria: no Urinary frequency: yes Urgency: yes Small volume voids: yes Symptom severity:  moderate Urinary incontinence: no Foul odor: yes Hematuria: yes Abdominal pain: no Back pain: no Suprapubic pain/pressure: yes Flank pain: no Fever:  no Vomiting: no Relief with cranberry juice:  n/a Relief with pyridium:  n/a Status: worse Previous urinary tract infection: yes Recurrent urinary tract infection: no Treatments attempted: none   ROS See pertinent positives and negatives per HPI.    Objective:    BP 96/70   Pulse 74   Temp (!) 96.8 F (36 C) (Temporal)   Wt 114 lb 9.6 oz (52 kg)   SpO2 99%   BMI 22.38 kg/m     Physical Exam Vitals and nursing note reviewed.  Constitutional:      General: She is not in acute distress.    Appearance: Normal appearance.  HENT:     Head: Normocephalic.  Eyes:     Conjunctiva/sclera: Conjunctivae normal.  Pulmonary:     Effort: Pulmonary effort is normal.  Abdominal:     Palpations: Abdomen is soft.     Tenderness: There is no abdominal tenderness. There is no right CVA tenderness or left CVA tenderness.  Musculoskeletal:     Cervical back: Normal range of motion.  Skin:    General: Skin is warm.  Neurological:     General: No focal deficit present.     Mental Status: She is alert and oriented to person, place, and time.  Psychiatric:        Mood and Affect: Mood normal.        Behavior: Behavior normal.        Thought Content: Thought content normal.        Judgment: Judgment normal.    Results for orders placed or performed in visit on 04/03/22  POCT  urinalysis dipstick  Result Value Ref Range   Color, UA Dark Yellow    Clarity, UA Cloudy    Glucose, UA Negative Negative   Bilirubin, UA Negative    Ketones, UA Negative    Spec Grav, UA 1.020 1.010 - 1.025   Blood, UA Negative    pH, UA 6.5 5.0 - 8.0   Protein, UA Positive (A) Negative   Urobilinogen, UA negative (A) 0.2 or 1.0 E.U./dL   Nitrite, UA Negative    Leukocytes, UA Negative Negative   Appearance Dark yellow    Odor          Assessment & Plan:   Problem List Items Addressed This Visit       Genitourinary   Gross hematuria - Primary    Acute urinary freqeuncy, pressure and pink urine when wiping for the last few days. U/A positive for protein, trace leukocytes. Will add urine culture. Treat for UTI with macrobid 100mg  BID x 5 days. Drink plenty of fluids. Follow-up if not improving.        Relevant Orders   POCT urinalysis dipstick (Completed)   Urine Culture    Meds ordered this encounter  Medications   nitrofurantoin, macrocrystal-monohydrate, (MACROBID)  100 MG capsule    Sig: Take 1 capsule (100 mg total) by mouth 2 (two) times daily.    Dispense:  10 capsule    Refill:  0    Return if symptoms worsen or fail to improve.  Gerre Scull, NP

## 2022-04-03 NOTE — Patient Instructions (Signed)
It was great to see you!  Start antibiotic twice a day for 5 days. Drink plenty of water  Let's follow-up if your symptoms don't improve or worsen.   Take care,  Rodman Pickle, NP

## 2022-04-04 ENCOUNTER — Ambulatory Visit: Payer: 59 | Admitting: Nurse Practitioner

## 2022-04-06 LAB — URINE CULTURE
MICRO NUMBER:: 13500971
SPECIMEN QUALITY:: ADEQUATE

## 2022-04-17 ENCOUNTER — Ambulatory Visit (HOSPITAL_BASED_OUTPATIENT_CLINIC_OR_DEPARTMENT_OTHER): Payer: 59 | Admitting: Psychiatry

## 2022-04-17 ENCOUNTER — Encounter (HOSPITAL_COMMUNITY): Payer: Self-pay | Admitting: Psychiatry

## 2022-04-17 VITALS — Wt 115.0 lb

## 2022-04-17 DIAGNOSIS — F902 Attention-deficit hyperactivity disorder, combined type: Secondary | ICD-10-CM

## 2022-04-17 DIAGNOSIS — F411 Generalized anxiety disorder: Secondary | ICD-10-CM | POA: Diagnosis not present

## 2022-04-17 MED ORDER — HYDROXYZINE HCL 10 MG PO TABS: 10.0000 mg | ORAL_TABLET | Freq: Every evening | ORAL | 0 refills | Status: DC | PRN

## 2022-04-17 MED ORDER — BUPROPION HCL ER (XL) 150 MG PO TB24: 150.0000 mg | ORAL_TABLET | Freq: Every day | ORAL | 0 refills | Status: DC

## 2022-04-17 NOTE — Progress Notes (Signed)
[facility] Initial Assessment Note  Patient Location: Home Provider Location: Home Office   I connected with Kristie Thornton by Video and verified that I am talking with correct person using two identifiers.   I discussed the limitations, risks, security and privacy concerns of performing an evaluation and management service virtually and the availability of in person appointments. I also discussed with the patient that there may be a patient responsible charge related to this service. The patient expressed understanding and agreed to proceed.  Kristie Thornton [id number] 30 y.o.  04/17/2022 1:09 PM  Chief Complaint:  I was misdiagnosed bipolar. I need medication for ADHD.  History of Present Illness:  Patient is 31 year old [place], divorced, employed female who is referred from her primary care physician.  Patient was seen by psychologist at [facility] last year and she was not happy because she was given the diagnosis of bipolar disorder.  Patient told the person was very dismissive and not listening.  Patient reported that she is not bipolar and had diagnoses of ADHD, anxiety.  She wants to go back on a stimulant which was given while she was in the school until 2012.  Patient not sure if she ever had psychological testing but reported at [facility] psychologist did some testing and told that she had bipolar.  Patient admitted that she has a lot of anger issues and irritability because of reason.  She lives with her 96-year-old daughter, parents and had a lot of financial issues.  She is a single mother.  Her father had a stroke and she is responsible financially for her family members.  She is a single mother because father of her daughter is not active in her daughter's life.  He is based in [place].  Patient reported insomnia, anxiety and her main concern is not able to focus at work.  She always feels on the go, she admitted Easily upset and tells the people if she  does not like anything.  During the conversation patient also noticed distracted, unable to stay focused, interrupting the conversation and jumping from one topic to the topic.  She was noticed excessive talking and keep walking and unable to relax herself.  She denies any hallucination, suicidal thoughts.  Patient appears somewhat guarded about her past.  Patient reported she belong to a strong [place] culture where you have to be strong minded to deal with the stressors.  She reported feeling anxious, nervous about everything and about every day.  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  She denies any weight gain or weight loss.  She denies any impulsive buying but reported irritability when dealing with family issues.  She gets easily upset with her mother when they are not agreed on something.  She reported lately being followed by people and she was scared and reported to the police but she was told to watch carefully and they cannot arrest anyone unless someone harm to you.  Her primary care physician given the Seroquel 25 mg which she takes only when she cannot sleep at bedtime.  She felt more restless, groggy next day.  She is also prescribed Klonopin which she takes for panic attack.  She is taking gabapentin 600 mg 4 pain related to her scoliosis.  Patient denies any suicidal thoughts, hallucination, nightmares, flashback.  She admitted smoking weight on a regular  basis.  She also admitted drinking on social gathering and a smoke pot when she go out of town where part is legal.  She does not have any therapist but looking into it to get some coping skills.  Patient denies any crying spells or any feeling of hopelessness.  She denies any anhedonia, agoraphobia or phobias.  Her mood is elevated and irritable.  Her attention concentration is distracted and quick to response.  During the conversation she was upset on  her daughter and also getting distracted in the conversation.  She reported some traumatic experience in the past but did not provide the details.  She reported given the diagnosis of PTSD however did not have the details.   Past Psychiatric History: Patient denies any history of suicidal attempt but reported having suicidal thoughts when she was taking sertraline prescribed by provider.  She was given Vyvanse and then Adderall by her primary care when she was in school age.  Since 2012 she has not taken any stimulant.  Recently she tried to establish care at [facility] but did not like the provider when she was given the diagnosis of bipolar.  No history of inpatient treatment  Family History  Problem Relation Age of Onset   Cancer Mother        colon cancer   Colon polyps Mother    Stroke Father    Cancer Father 24       testicular cancer   Liver disease Sister    Fibromyalgia Sister    Pancreatic disease Sister    ADD / ADHD Brother    Colon cancer Neg Hx    Esophageal cancer Neg Hx    Inflammatory bowel disease Neg Hx    Pancreatic cancer Neg Hx    Rectal cancer Neg Hx    Stomach cancer Neg Hx       Past Medical History:  Diagnosis Date   Abscess    sub areolar left breast   Anxiety    Back pain    Carpal tunnel syndrome during pregnancy 06/03/2017   Chlamydia infection affecting pregnancy, antepartum 03/26/2017   April 2018- POS [x]  TOC neg   Depression    Gestational hypertension 10/08/2017   History of IBS    PTSD (post-traumatic stress disorder)    Suicide and self-inflicted injury (HCC)    when a teenager   Vacuum extractor delivery, delivered 10/06/2017     Traumatic Head Injury: Patient denies any traumatic head injury.  Work History; Patient is working in 14/07/2017.  She has been working part-time at a very early age but now working full-time 60 hours a week.  Psychosocial History; Patient born in [place].  Her parents are from [place].   Patient reported that she was brought in a strong [place] culture.  She did basic schooling and finished her high school and tried to finish college from [place] but dropped out after 2 years.  Patient says she was doing 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  Patient has a brother [place] and sister who live in [place].  She married once after she got pregnant but admitted marriage did not last long due to infidelity, cheating and at loyal to the family.  Patient to join the [employer] and currently in [employer].  He had sporadic contact with the daughter.    Legal History; Patient denies any legal issues.  History Of Abuse; Patient reported diagnosed with PTSD but did not provide the details.  She denies any nightmares, flashback.  Substance Abuse History; Patient reported history of drinking, but had cut down from the past.  She still smoke marijuana when she is out of the town where marijuana is legal.  She admitted smoking weight and legal CBD.  She denies any history of intoxication, blackouts, DUI.  Neurologic: Headache: No Seizure: No Paresthesias: No   Outpatient Encounter Medications as of 04/17/2022  Medication Sig   clonazePAM (KLONOPIN) 0.5 MG tablet Take 1 tablet (0.5 mg total) by mouth 2 (two) times daily as needed for anxiety.   gabapentin (NEURONTIN) 600 MG tablet Take 1 tablet in the morning, 1 tablet in the afternoon, and 1.5 tablets at bedtime   hyoscyamine (LEVSIN) 0.125 MG tablet TAKE 1 TABLET(0.125 MG) BY MOUTH EVERY 4 HOURS AS NEEDED   ibuprofen (ADVIL) 800 MG tablet Take 800 mg by mouth 3 (three) times daily.   nitrofurantoin, macrocrystal-monohydrate, (MACROBID) 100 MG capsule Take 1 capsule (100 mg total) by mouth 2 (two) times daily.   polyethylene glycol powder (GLYCOLAX/MIRALAX) 17 GM/SCOOP powder Take 17 g by mouth daily as needed.   promethazine (PHENERGAN) 12.5  MG tablet Take 1 tablet (12.5 mg total) by mouth every 8 (eight) hours as needed for nausea or vomiting.   QUEtiapine (SEROQUEL) 50 MG tablet Take 1 tablet (50 mg total) by mouth at bedtime. Start 1/2 tablet at bedtime for 7 days and then take 1 tablet at bedtime   trimethoprim-polymyxin b (POLYTRIM) ophthalmic solution Place 1 drop into the right eye every 6 (six) hours.   No facility-administered encounter medications on file as of 04/17/2022.    Recent Results (from the past 2160 hour(s))  Cervicovaginal ancillary only     Status: None   Collection Time: 02/19/22 10:33 AM  Result Value Ref Range   Neisseria Gonorrhea Negative    Chlamydia Negative    Trichomonas Negative    Bacterial Vaginitis (gardnerella) Negative    Candida Vaginitis Negative    Candida Glabrata Negative    Comment      Normal Reference Range Bacterial Vaginosis - Negative   Comment Normal Reference Range Candida Species - Negative    Comment Normal Reference Range Candida Galbrata - Negative    Comment Normal Reference Range Trichomonas - Negative    Comment Normal Reference Ranger Chlamydia - Negative    Comment      Normal Reference Range Neisseria Gonorrhea - Negative  Comprehensive metabolic panel     Status: None   Collection Time: 02/19/22 10:57 AM  Result Value Ref Range   Sodium 138 135 - 145 mEq/L   Potassium 4.1 3.5 - 5.1 mEq/L   Chloride 101 96 - 112 mEq/L   CO2 28 19 - 32 mEq/L   Glucose, Bld 95 70 - 99 mg/dL   BUN 6 6 - 23 mg/dL   Creatinine, Ser 1.610.66 0.40 - 1.20 mg/dL   Total Bilirubin 0.3 0.2 - 1.2 mg/dL   Alkaline Phosphatase 41 39 - 117 U/L   AST 17 0 - 37 U/L   ALT 12 0 - 35 U/L   Total Protein 7.6 6.0 - 8.3 g/dL   Albumin 4.6 3.5 - 5.2 g/dL   GFR 096.04117.58 >54.09>60.00 mL/min    Comment: Calculated using the CKD-EPI Creatinine Equation (2021)   Calcium 9.4 8.4 - 10.5 mg/dL  CBC with Differential/Platelet     Status: None   Collection  Time: 02/19/22 10:57 AM  Result Value Ref Range   WBC  4.4 4.0 - 10.5 K/uL   RBC 4.03 3.87 - 5.11 Mil/uL   Hemoglobin 13.3 12.0 - 15.0 g/dL   HCT 74.2 59.5 - 63.8 %   MCV 98.9 78.0 - 100.0 fl   MCHC 33.4 30.0 - 36.0 g/dL   RDW 75.6 43.3 - 29.5 %   Platelets 278.0 150.0 - 400.0 K/uL   Neutrophils Relative % 53.5 43.0 - 77.0 %   Lymphocytes Relative 37.1 12.0 - 46.0 %   Monocytes Relative 6.3 3.0 - 12.0 %   Eosinophils Relative 2.8 0.0 - 5.0 %   Basophils Relative 0.3 0.0 - 3.0 %   Neutro Abs 2.3 1.4 - 7.7 K/uL   Lymphs Abs 1.6 0.7 - 4.0 K/uL   Monocytes Absolute 0.3 0.1 - 1.0 K/uL   Eosinophils Absolute 0.1 0.0 - 0.7 K/uL   Basophils Absolute 0.0 0.0 - 0.1 K/uL  POCT urinalysis dipstick     Status: Abnormal   Collection Time: 02/19/22  2:09 PM  Result Value Ref Range   Color, UA yellow    Clarity, UA slightly cloudy    Glucose, UA Negative Negative   Bilirubin, UA negative    Ketones, UA negative    Spec Grav, UA 1.015 1.010 - 1.025   Blood, UA negative    pH, UA 7.5 5.0 - 8.0   Protein, UA Positive (A) Negative    Comment: 15 mg/dL   Urobilinogen, UA 0.2 0.2 or 1.0 E.U./dL   Nitrite, UA negative    Leukocytes, UA Negative Negative   Appearance     Odor    POCT urine pregnancy     Status: Normal   Collection Time: 02/19/22  2:15 PM  Result Value Ref Range   Preg Test, Ur Negative Negative  POCT urinalysis dipstick     Status: Abnormal   Collection Time: 04/03/22 11:47 AM  Result Value Ref Range   Color, UA Dark Yellow    Clarity, UA Cloudy    Glucose, UA Negative Negative   Bilirubin, UA Negative    Ketones, UA Negative     Comment: +- 5 mg/dL   Spec Grav, UA 1.884 1.660 - 1.025   Blood, UA Negative    pH, UA 6.5 5.0 - 8.0   Protein, UA Positive (A) Negative    Comment: 1+ 30 mg/dL   Urobilinogen, UA negative (A) 0.2 or 1.0 E.U./dL   Nitrite, UA Negative    Leukocytes, UA Negative Negative    Comment: +- 15 Leu/uL   Appearance Dark yellow    Odor    Urine Culture     Status: Abnormal   Collection Time: 04/04/22   8:21 AM   Specimen: Urine  Result Value Ref Range   MICRO NUMBER: [id number]    SPECIMEN QUALITY: Adequate    Sample Source NOT GIVEN    STATUS: FINAL    ISOLATE 1: Escherichia coli (A)     Comment: Greater than 100,000 CFU/mL of Escherichia coli      Susceptibility   Escherichia coli - URINE CULTURE, REFLEX    AMOX/CLAVULANIC 4 Sensitive     AMPICILLIN >=32 Resistant     AMPICILLIN/SULBACTAM 16 Intermediate     CEFAZOLIN* <=4 Not Reportable      * For infections other than uncomplicated UTI caused by E. coli, K. pneumoniae or P. mirabilis: Cefazolin is resistant if MIC > or = 8 mcg/mL. (Distinguishing susceptible versus intermediate  for isolates with MIC < or = 4 mcg/mL requires additional testing.) For uncomplicated UTI caused by E. coli, K. pneumoniae or P. mirabilis: Cefazolin is susceptible if MIC <32 mcg/mL and predicts susceptible to the oral agents cefaclor, cefdinir, cefpodoxime, cefprozil, cefuroxime, cephalexin and loracarbef.     CEFTAZIDIME <=1 Sensitive     CEFEPIME <=1 Sensitive     CEFTRIAXONE <=1 Sensitive     CIPROFLOXACIN <=0.25 Sensitive     LEVOFLOXACIN <=0.12 Sensitive     GENTAMICIN <=1 Sensitive     IMIPENEM <=0.25 Sensitive     NITROFURANTOIN <=16 Sensitive     PIP/TAZO <=4 Sensitive     TOBRAMYCIN <=1 Sensitive     TRIMETH/SULFA* >=320 Resistant      * For infections other than uncomplicated UTI caused by E. coli, K. pneumoniae or P. mirabilis: Cefazolin is resistant if MIC > or = 8 mcg/mL. (Distinguishing susceptible versus intermediate for isolates with MIC < or = 4 mcg/mL requires additional testing.) For uncomplicated UTI caused by E. coli, K. pneumoniae or P. mirabilis: Cefazolin is susceptible if MIC <32 mcg/mL and predicts susceptible to the oral agents cefaclor, cefdinir, cefpodoxime, cefprozil, cefuroxime, cephalexin and loracarbef. Legend: S = Susceptible  I = Intermediate R = Resistant  NS = Not susceptible * = Not tested  NR =  Not reported **NN = See antimicrobic comments       Constitutional:  Wt 115 lb (52.2 kg)   BMI 22.46 kg/m    Musculoskeletal: Strength & Muscle Tone: within normal limits Gait & Station: normal Patient leans: N/A  Psychiatric Specialty Exam: Physical Exam  ROS  Weight 115 lb (52.2 kg), unknown if currently breastfeeding.There is no height or weight on file to calculate BMI.  General Appearance: Fairly Groomed and Guarded  Eye Contact:  Fair  Speech:   fast  Volume:  Increased  Mood:  Anxious and Irritable  Affect:  Labile  Thought Process:  Descriptions of Associations: Circumstantial  Orientation:  Full (Time, Place, and Person)  Thought Content:  Rumination and distracted  Suicidal Thoughts:  No  Homicidal Thoughts:  No  Memory:  Immediate;   Fair Recent;   Fair Remote;   Fair  Judgement:  Fair  Insight:  Fair  Psychomotor Activity:  Increased and Restlessness  Concentration:  Concentration: Poor and Attention Span: Fair  Recall:  [employer] of Knowledge:  Fair  Language:  Good  Akathisia:  No  Handed:  Right  AIMS (if indicated):     Assets:  Communication Skills Desire for Improvement Housing Talents/Skills Transportation  ADL's:  Intact  Cognition:  WNL  Sleep:   poor without Seroquel     Assessment/Plan:  Patient is 31 year old [place] female who was given the diagnosis of bipolar disorder, PTSD, ADHD, generalized anxiety but not happy with the diagnosis of bipolar wants to establish care so she can address the symptoms of her ADHD.  I review her medication, collateral information, history, blood work and psychosocial stressors.  I talked to her in detail about her symptoms.  Patient admitted she have irritability and anger when she do not get along with the family members.  Her biggest concern is focus, attention and not able to finish her task.  I recommend trying a low-dose Wellbutrin to help her symptoms.  She agreed to give a try we will  start Wellbutrin XL 150 mg in the morning.  Recommend to discontinue Seroquel since patient having side effects.  She is taking  Klonopin prescribed by PCP for severe anxiety.  We will add a low-dose hydroxyzine 10-20 mg at bedtime to help her sleep.  We will refer her for individual counseling and patient like to have to retake her ADHD evaluation and we will refer to a psychologist.  Discussed safety concerns at any time having active suicidal thoughts or homicidal thought then she need to call 911 or go to local emergency room.  Follow-up in 3 weeks.  Cleotis Nipper, MD 04/17/2022    Follow Up Instructions: I discussed the assessment and treatment plan with the patient. The patient was provided an opportunity to ask questions and all were answered. The patient agreed with the plan and demonstrated an understanding of the instructions.   The patient was advised to call back or seek an in-person evaluation if the symptoms worsen or if the condition fails to improve as anticipated.   Collaboration of Care: Primary Care Provider AEB notes are available in epic to review.   Patient/Guardian was advised Release of Information must be obtained prior to any record release in order to collaborate their care with an outside provider. Patient/Guardian was advised if they have not already done so to contact the registration department to sign all necessary forms in order for [place] to release information regarding their care.    Consent: Patient/Guardian gives verbal consent for treatment and assignment of benefits for services provided during this visit. Patient/Guardian expressed understanding and agreed to proceed.     I provided 70 minutes of non-face-to-face time during this encounter.

## 2022-04-22 ENCOUNTER — Telehealth (HOSPITAL_COMMUNITY): Payer: Self-pay | Admitting: *Deleted

## 2022-04-25 ENCOUNTER — Ambulatory Visit: Payer: 59 | Admitting: Nurse Practitioner

## 2022-04-26 ENCOUNTER — Other Ambulatory Visit (HOSPITAL_COMMUNITY)
Admission: RE | Admit: 2022-04-26 | Discharge: 2022-04-26 | Disposition: A | Discharge status: Home / Self Care | Payer: 59 | Source: Ambulatory Visit | Attending: Nurse Practitioner | Admitting: Nurse Practitioner

## 2022-04-26 ENCOUNTER — Telehealth: Payer: Self-pay | Admitting: Nurse Practitioner

## 2022-04-26 ENCOUNTER — Ambulatory Visit (INDEPENDENT_AMBULATORY_CARE_PROVIDER_SITE_OTHER): Payer: 59 | Admitting: Nurse Practitioner

## 2022-04-26 ENCOUNTER — Encounter: Payer: Self-pay | Admitting: Nurse Practitioner

## 2022-04-26 VITALS — BP 94/68 | Temp 98.4°F | Wt 115.4 lb

## 2022-04-26 DIAGNOSIS — N926 Irregular menstruation, unspecified: Secondary | ICD-10-CM

## 2022-04-26 DIAGNOSIS — R399 Unspecified symptoms and signs involving the genitourinary system: Secondary | ICD-10-CM | POA: Insufficient documentation

## 2022-04-26 DIAGNOSIS — F411 Generalized anxiety disorder: Secondary | ICD-10-CM | POA: Diagnosis not present

## 2022-04-26 LAB — POCT URINALYSIS DIPSTICK
Bilirubin, UA: NEGATIVE
Blood, UA: NEGATIVE
Glucose, UA: NEGATIVE
Ketones, UA: NEGATIVE
Leukocytes, UA: NEGATIVE
Nitrite, UA: NEGATIVE
Protein, UA: POSITIVE — AB
Spec Grav, UA: 1.015 (ref 1.010–1.025)
Urobilinogen, UA: NEGATIVE E.U./dL — AB
pH, UA: 7.5 (ref 5.0–8.0)

## 2022-04-26 LAB — POCT URINE PREGNANCY: Preg Test, Ur: NEGATIVE

## 2022-04-26 MED ORDER — HYOSCYAMINE SULFATE 0.125 MG PO TABS
ORAL_TABLET | ORAL | 1 refills | Status: DC
Start: 2022-04-26 — End: 2022-08-26

## 2022-04-26 MED ORDER — CLONAZEPAM 0.5 MG PO TABS
0.5000 mg | ORAL_TABLET | Freq: Two times a day (BID) | ORAL | 0 refills | Status: DC | PRN
Start: 2022-04-26 — End: 2022-09-11

## 2022-04-26 MED ORDER — GABAPENTIN 600 MG PO TABS: 600.0000 mg | ORAL_TABLET | Freq: Three times a day (TID) | ORAL | 1 refills | Status: DC

## 2022-04-26 NOTE — Telephone Encounter (Signed)
Pt was seen today 04/26/22 and as she was leaving, she wants to know if she will be able to get clonazePAM (KLONOPIN) 0.5 MG tablet [[id number]] and ibuprofen (ADVIL) 800 MG tablet [[id number]]  from today's visit.  If this is an issue please give pt a call at  [phone]

## 2022-04-26 NOTE — Assessment & Plan Note (Signed)
Chronic, ongoing. She had an appointment with a psychiatrist, however she didn't feel a connection with him. She has another appointment with a different provider in July. PDMP reviewed, last klonopin prescription has last over 2 months. Will refill today, however further refills will need to be done by psychiatry.

## 2022-04-26 NOTE — Progress Notes (Signed)
Acute Office Visit  Subjective:     Patient ID: Kristie Thornton, female    DOB: [birth year], 30 y.o.   MRN: [id number]  Chief Complaint  Patient presents with   Urinary Tract Infection    Pt c/o urinary frequency w/ vaginal discomfort x2 days. Med review/refill    HPI Patient is in today for UTI symptoms. She has been having urinary frequency, however not burning. She states that she went swimming recently. She denies fevers, nausea and vomiting. She was treated for a UTI recently.   She went and saw a psychiatrist, however she did not feel a good connection with him. She has an appointment scheduled with another therapist in July. She is asking for a refill of her klonopin since she may be losing her insurance soon.   ROS See pertinent positives and negatives per HPI.     Objective:    BP 94/68   Temp 98.4 F (36.9 C) (Oral)   Wt 115 lb 6.4 oz (52.3 kg)   BMI 22.54 kg/m    Physical Exam Vitals and nursing note reviewed.  Constitutional:      General: She is not in acute distress.    Appearance: Normal appearance.  HENT:     Head: Normocephalic.  Eyes:     Conjunctiva/sclera: Conjunctivae normal.  Pulmonary:     Effort: Pulmonary effort is normal.  Abdominal:     General: There is no distension.     Palpations: Abdomen is soft.     Tenderness: There is no abdominal tenderness. There is no right CVA tenderness or left CVA tenderness.  Musculoskeletal:     Cervical back: Normal range of motion.  Skin:    General: Skin is warm.  Neurological:     General: No focal deficit present.     Mental Status: She is alert and oriented to person, place, and time.  Psychiatric:        Mood and Affect: Mood normal.        Behavior: Behavior normal.        Thought Content: Thought content normal.        Judgment: Judgment normal.     Results for orders placed or performed in visit on 04/26/22  POCT Urinalysis Dipstick  Result Value Ref Range   Color, UA Dark Yellow     Clarity, UA Cloudy    Glucose, UA Negative Negative   Bilirubin, UA Negative    Ketones, UA Negative    Spec Grav, UA 1.015 1.010 - 1.025   Blood, UA Negative    pH, UA 7.5 5.0 - 8.0   Protein, UA Positive (A) Negative   Urobilinogen, UA negative (A) 0.2 or 1.0 E.U./dL   Nitrite, UA Negative    Leukocytes, UA Negative Negative   Appearance     Odor    POCT urine pregnancy  Result Value Ref Range   Preg Test, Ur Negative Negative        Assessment & Plan:   Problem List Items Addressed This Visit       Other   Generalized anxiety disorder    Chronic, ongoing. She had an appointment with a psychiatrist, however she didn't feel a connection with him. She has another appointment with a different provider in July. PDMP reviewed, last klonopin prescription has last over 2 months. Will refill today, however further refills will need to be done by psychiatry.       Other Visit Diagnoses     UTI  symptoms    -  Primary   U/A negative for leukocytes and nitrates. Will check urine culture and for BV and yeast. Encourage fluids.    Relevant Orders   POCT Urinalysis Dipstick (Completed)   Urine Culture   Urine cytology ancillary only   Missed period       Urine pregnancy negative. She had a depo provera injection in January and has not had a menstrual period since then    Relevant Orders   POCT urine pregnancy (Completed)       Meds ordered this encounter  Medications   hyoscyamine (LEVSIN) 0.125 MG tablet    Sig: TAKE 1 TABLET(0.125 MG) BY MOUTH EVERY 4 HOURS AS NEEDED    Dispense:  90 tablet    Refill:  1    ZERO refills remain on this prescription. Your patient is requesting advance approval of refills for this medication to PREVENT ANY MISSED DOSES   gabapentin (NEURONTIN) 600 MG tablet    Sig: Take 1 tablet (600 mg total) by mouth 3 (three) times daily. Take 1 tablet in the morning, 1 tablet in the afternoon, and 1.5 tablets at bedtime    Dispense:  270 tablet     Refill:  1   clonazePAM (KLONOPIN) 0.5 MG tablet    Sig: Take 1 tablet (0.5 mg total) by mouth 2 (two) times daily as needed for anxiety.    Dispense:  60 tablet    Refill:  0    Return if symptoms worsen or fail to improve.  Gerre Scull, NP

## 2022-04-26 NOTE — Telephone Encounter (Signed)
Provider notified, prescriptions have been refilled per pt request. Sw, cma

## 2022-04-27 LAB — URINE CULTURE
MICRO NUMBER:: 13594401
Result:: NO GROWTH
SPECIMEN QUALITY:: ADEQUATE

## 2022-04-29 ENCOUNTER — Encounter: Payer: 59 | Admitting: Nurse Practitioner

## 2022-04-29 LAB — URINE CYTOLOGY ANCILLARY ONLY
Bacterial Vaginitis-Urine: NEGATIVE
Candida Urine: NEGATIVE
Chlamydia: NEGATIVE
Comment: NEGATIVE
Comment: NORMAL
Neisseria Gonorrhea: NEGATIVE

## 2022-04-29 NOTE — Progress Notes (Unsigned)
There were no vitals taken for this visit.   Subjective:    Patient ID: Kristie Thornton, female    DOB: [birth year], 31 y.o.   MRN: [id number]  HPI: Kristie Thornton is a 31 y.o. female presenting on 05/01/2022 for comprehensive medical examination. Current medical complaints include:{Blank single:19197::"none","***"}  She currently lives with: daughter Menopausal Symptoms: no  Depression Screen done today and results listed below:     01/15/2022   11:12 AM 12/10/2021    3:22 PM 11/20/2021    9:12 AM 11/11/2017    2:08 PM 10/15/2017    4:25 PM  Depression screen PHQ 2/9  Decreased Interest 0 0 0 0 0  Down, Depressed, Hopeless  1 1 0 0  PHQ - 2 Score 0 1 1 0 0  Altered sleeping 3 2 2  0   Tired, decreased energy 2 3 1  0   Change in appetite 3 3 1 1    Feeling bad or failure about yourself  0 1 0 0   Trouble concentrating 1 2 0 0   Moving slowly or fidgety/restless 2 2 0 0   Suicidal thoughts 0 0 0 0   PHQ-9 Score 11 14 5 1    Difficult doing work/chores Extremely dIfficult Very difficult Somewhat difficult      The patient {has/does not have:19849} a history of falls. I {did/did not:19850} complete a risk assessment for falls. A plan of care for falls {was/was not:19852} documented.   Past Medical History:  Past Medical History:  Diagnosis Date   Abscess    sub areolar left breast   Anxiety    Back pain    Carpal tunnel syndrome during pregnancy 06/03/2017   Chlamydia infection affecting pregnancy, antepartum 03/26/2017   April 2018- POS [x]  TOC neg   Depression    Gestational hypertension 10/08/2017   History of IBS    PTSD (post-traumatic stress disorder)    Suicide and self-inflicted injury (HCC)    when a teenager   Vacuum extractor delivery, delivered 10/06/2017    Surgical History:  Past Surgical History:  Procedure Laterality Date   NO PAST SURGERIES      Medications:  Current Outpatient Medications on File Prior to Visit  Medication Sig   buPROPion (WELLBUTRIN XL) 150  MG 24 hr tablet Take 1 tablet (150 mg total) by mouth daily.   clonazePAM (KLONOPIN) 0.5 MG tablet Take 1 tablet (0.5 mg total) by mouth 2 (two) times daily as needed for anxiety.   gabapentin (NEURONTIN) 600 MG tablet Take 1 tablet (600 mg total) by mouth 3 (three) times daily. Take 1 tablet in the morning, 1 tablet in the afternoon, and 1.5 tablets at bedtime   hydrOXYzine (ATARAX) 10 MG tablet Take 1-2 tablets (10-20 mg total) by mouth at bedtime as needed.   hyoscyamine (LEVSIN) 0.125 MG tablet TAKE 1 TABLET(0.125 MG) BY MOUTH EVERY 4 HOURS AS NEEDED   ibuprofen (ADVIL) 800 MG tablet Take 800 mg by mouth 3 (three) times daily.   nitrofurantoin, macrocrystal-monohydrate, (MACROBID) 100 MG capsule Take 1 capsule (100 mg total) by mouth 2 (two) times daily.   polyethylene glycol powder (GLYCOLAX/MIRALAX) 17 GM/SCOOP powder Take 17 g by mouth daily as needed.   promethazine (PHENERGAN) 12.5 MG tablet Take 1 tablet (12.5 mg total) by mouth every 8 (eight) hours as needed for nausea or vomiting.   QUEtiapine (SEROQUEL) 50 MG tablet Take 1 tablet (50 mg total) by mouth at bedtime. Start 1/2 tablet at bedtime for 7  days and then take 1 tablet at bedtime   trimethoprim-polymyxin b (POLYTRIM) ophthalmic solution Place 1 drop into the right eye every 6 (six) hours.   No current facility-administered medications on file prior to visit.    Allergies:  Allergies  Allergen Reactions   Lexapro [Escitalopram] Other (See Comments)    suicidal   Sertraline Hcl Other (See Comments)    Suicidality    Social History:  Social History   Socioeconomic History   Marital status: Divorced    Spouse name: n/a   Number of children: 0   Years of education: 12th +   Highest education level: Not on file  Occupational History   Occupation: Investment banker, corporate: STUDENT    Comment: [employer]  Tobacco Use   Smoking status: Former    Types: Cigarettes   Smokeless tobacco: Never   Tobacco comments:    stopped  02/2017 previously vaped  Vaping Use   Vaping Use: Every day  Substance and Sexual Activity   Alcohol use: No    Alcohol/week: 0.0 standard drinks of alcohol    Comment: stopped 01/2017 occasional   Drug use: No    Types: Marijuana    Comment: 03/12/17 last week ago, trying to stop   Sexual activity: Yes    Partners: Male    Birth control/protection: None  Other Topics Concern   Not on file  Social History Narrative   Parents are from [place]. Parents and her siblings were born in the [place].   Nail tech   Marital status: divorced      Children: one daughter     Lives: with duaghter      Employment: nail tech      Tobacco:  None      Alcohol: weekends; no DWIs      Drugs: marijuana socially         Social Determinants of Health   Financial Resource Strain: Not on file  Food Insecurity: Not on file  Transportation Needs: Not on file  Physical Activity: Not on file  Stress: Not on file  Social Connections: Not on file  Intimate Partner Violence: Not on file   Social History   Tobacco Use  Smoking Status Former   Types: Cigarettes  Smokeless Tobacco Never  Tobacco Comments   stopped 02/2017 previously vaped   Social History   Substance and Sexual Activity  Alcohol Use No   Alcohol/week: 0.0 standard drinks of alcohol   Comment: stopped 01/2017 occasional    Family History:  Family History  Problem Relation Age of Onset   Cancer Mother        colon cancer   Colon polyps Mother    Stroke Father    Cancer Father 57       testicular cancer   Liver disease Sister    Fibromyalgia Sister    Pancreatic disease Sister    ADD / ADHD Sister    ADD / ADHD Sister    ADD / ADHD Brother    Colon cancer Neg Hx    Esophageal cancer Neg Hx    Inflammatory bowel disease Neg Hx    Pancreatic cancer Neg Hx    Rectal cancer Neg Hx    Stomach cancer Neg Hx     Past medical history, surgical history, medications, allergies, family history and social history reviewed with  patient today and changes made to appropriate areas of the chart.   ROS All other ROS negative except what  is listed above and in the HPI.      Objective:    There were no vitals taken for this visit.  Wt Readings from Last 3 Encounters:  04/26/22 115 lb 6.4 oz (52.3 kg)  04/03/22 114 lb 9.6 oz (52 kg)  03/05/22 117 lb (53.1 kg)    Physical Exam  Results for orders placed or performed in visit on 04/26/22  Urine Culture   Specimen: Urine  Result Value Ref Range   MICRO NUMBER: [id number]    SPECIMEN QUALITY: Adequate    Sample Source URINE    STATUS: FINAL    Result: No Growth   POCT Urinalysis Dipstick  Result Value Ref Range   Color, UA Dark Yellow    Clarity, UA Cloudy    Glucose, UA Negative Negative   Bilirubin, UA Negative    Ketones, UA Negative    Spec Grav, UA 1.015 1.010 - 1.025   Blood, UA Negative    pH, UA 7.5 5.0 - 8.0   Protein, UA Positive (A) Negative   Urobilinogen, UA negative (A) 0.2 or 1.0 E.U./dL   Nitrite, UA Negative    Leukocytes, UA Negative Negative   Appearance     Odor    POCT urine pregnancy  Result Value Ref Range   Preg Test, Ur Negative Negative      Assessment & Plan:   Problem List Items Addressed This Visit   None    Follow up plan: No follow-ups on file.   LABORATORY TESTING:  - Pap smear: {Blank single:19197::"pap done","not applicable","up to date","done elsewhere"}  IMMUNIZATIONS:   - Tdap: Tetanus vaccination status reviewed: last tetanus booster within 10 years. - Influenza: Postponed to flu season - Pneumovax: Not applicable - Prevnar: Not applicable - HPV: Not applicable - Zostavax vaccine: Not applicable  SCREENING: -Mammogram: Not applicable  - Colonoscopy: Not applicable  - Bone Density: Not applicable  -Hearing Test: Not applicable  -Spirometry: Not applicable   PATIENT COUNSELING:   Advised to take 1 mg of folate supplement per day if capable of pregnancy.   Sexuality: Discussed sexually  transmitted diseases, partner selection, use of condoms, avoidance of unintended pregnancy  and contraceptive alternatives.   Advised to avoid cigarette smoking.  I discussed with the patient that most people either abstain from alcohol or drink within safe limits (<=14/week and <=4 drinks/occasion for males, <=7/weeks and <= 3 drinks/occasion for females) and that the risk for alcohol disorders and other health effects rises proportionally with the number of drinks per week and how often a drinker exceeds daily limits.  Discussed cessation/primary prevention of drug use and availability of treatment for abuse.   Diet: Encouraged to adjust caloric intake to maintain  or achieve ideal body weight, to reduce intake of dietary saturated fat and total fat, to limit sodium intake by avoiding high sodium foods and not adding table salt, and to maintain adequate dietary potassium and calcium preferably from fresh fruits, vegetables, and low-fat dairy products.    stressed the importance of regular exercise  Injury prevention: Discussed safety belts, safety helmets, smoke detector, smoking near bedding or upholstery.   Dental health: Discussed importance of regular tooth brushing, flossing, and dental visits.    NEXT PREVENTATIVE PHYSICAL DUE IN 1 YEAR. No follow-ups on file.

## 2022-05-01 ENCOUNTER — Encounter: Payer: Self-pay | Admitting: Nurse Practitioner

## 2022-05-01 ENCOUNTER — Other Ambulatory Visit (HOSPITAL_COMMUNITY)
Admission: RE | Admit: 2022-05-01 | Discharge: 2022-05-01 | Disposition: A | Discharge status: Home / Self Care | Payer: 59 | Source: Ambulatory Visit | Attending: Nurse Practitioner | Admitting: Nurse Practitioner

## 2022-05-01 ENCOUNTER — Ambulatory Visit (INDEPENDENT_AMBULATORY_CARE_PROVIDER_SITE_OTHER): Payer: 59 | Admitting: Nurse Practitioner

## 2022-05-01 VITALS — BP 108/88 | HR 78 | Temp 96.2°F | Wt 114.0 lb

## 2022-05-01 DIAGNOSIS — Z124 Encounter for screening for malignant neoplasm of cervix: Secondary | ICD-10-CM | POA: Insufficient documentation

## 2022-05-01 DIAGNOSIS — Z0000 Encounter for general adult medical examination without abnormal findings: Secondary | ICD-10-CM | POA: Diagnosis not present

## 2022-05-01 DIAGNOSIS — F411 Generalized anxiety disorder: Secondary | ICD-10-CM

## 2022-05-01 DIAGNOSIS — Z599 Problem related to housing and economic circumstances, unspecified: Secondary | ICD-10-CM | POA: Diagnosis not present

## 2022-05-01 NOTE — Assessment & Plan Note (Signed)
Chronic, ongoing.  She states that with her recent loss of her job and trouble with home life her anxiety has worsened.  She is currently following with psychiatry.  Are also placing a referral to [facility] to help with any community resources available.

## 2022-05-01 NOTE — Assessment & Plan Note (Signed)
She is currently living with her mom and dad and states that she has been having issues at home with them.  She recently lost her job and the house where she is staying only has 1 window unit air conditioner and it is 85 degrees in the house.  She states that she does not qualify for food stamps, however is having trouble with getting food and her mom will not let her eat her food.  Referral placed to [facility] to see if there are any community resources available to help.

## 2022-05-01 NOTE — Patient Instructions (Signed)
It was great to see you!  We will let you know how your pap results.  I hope things get better at home. Let me know if I can do anything for you.  Let's follow-up in 6 months, sooner if you have concerns.  If a referral was placed today, you will be contacted for an appointment. Please note that routine referrals can sometimes take up to 3-4 weeks to process. Please call our office if you haven't heard anything after this time frame.  Take care,  Rodman Pickle, NP'

## 2022-05-02 ENCOUNTER — Encounter: Payer: Self-pay | Admitting: Gastroenterology

## 2022-05-02 ENCOUNTER — Telehealth: Payer: Self-pay

## 2022-05-02 ENCOUNTER — Ambulatory Visit (INDEPENDENT_AMBULATORY_CARE_PROVIDER_SITE_OTHER): Payer: 59 | Admitting: Gastroenterology

## 2022-05-02 VITALS — BP 90/60 | HR 90 | Ht 60.0 in | Wt 114.0 lb

## 2022-05-02 DIAGNOSIS — R194 Change in bowel habit: Secondary | ICD-10-CM | POA: Diagnosis not present

## 2022-05-02 DIAGNOSIS — K581 Irritable bowel syndrome with constipation: Secondary | ICD-10-CM

## 2022-05-02 DIAGNOSIS — K625 Hemorrhage of anus and rectum: Secondary | ICD-10-CM | POA: Diagnosis not present

## 2022-05-02 DIAGNOSIS — K5904 Chronic idiopathic constipation: Secondary | ICD-10-CM | POA: Diagnosis not present

## 2022-05-02 LAB — CYTOLOGY - PAP
Comment: NEGATIVE
Diagnosis: NEGATIVE
High risk HPV: NEGATIVE

## 2022-05-02 MED ORDER — NA SULFATE-K SULFATE-MG SULF 17.5-3.13-1.6 GM/177ML PO SOLN: 1.0000 | Freq: Once | ORAL | 0 refills | Status: AC

## 2022-05-02 MED ORDER — DICYCLOMINE HCL 20 MG PO TABS: 20.0000 mg | ORAL_TABLET | Freq: Four times a day (QID) | ORAL | 6 refills | Status: DC | PRN

## 2022-05-02 NOTE — Patient Instructions (Addendum)
Contact your insurance company to let them know that your gastroenterologist would like you to have a diagnostic colonoscopy for change in bowel habits, chronic idiopathic constipation and irritable bowel syndrome-constipation along with bright red blood per rectum. They should be able to give you the copay cost for this procedure.  You have been scheduled for a colonoscopy. Please follow written instructions given to you at your visit today.  Please pick up your prep supplies at the pharmacy within the next 1-3 days. If you use inhalers (even only as needed), please bring them with you on the day of your procedure.  We have sent the following medications to your pharmacy for you to pick up at your convenience: Bentyl 20 mg 1 tablet every 6 hours as needed (IN PLACE of Levsin/hyoscyamine). IF this is ineffective please send a mychart message.  You may use Linzess 290 mcg samples once daily if the bentyl is ineffective.  If you are age 31 or older, your body mass index should be between 23-30. Your Body mass index is 22.26 kg/m. If this is out of the aforementioned range listed, please consider follow up with your Primary Care Provider.  If you are age 31 or younger, your body mass index should be between 19-25. Your Body mass index is 22.26 kg/m. If this is out of the aformentioned range listed, please consider follow up with your Primary Care Provider.   ________________________________________________________  The [facility] providers would like to encourage you to use [facility] to communicate with providers for non-urgent requests or questions.  Due to long hold times on the telephone, sending your provider a message by [facility] may be a faster and more efficient way to get a response.  Please allow 48 business hours for a response.  Please remember that this is for non-urgent requests.  _______________________________________________________  Due to recent changes in healthcare laws, you may  see the results of your imaging and laboratory studies on MyChart before your provider has had a chance to review them.  We understand that in some cases there may be results that are confusing or concerning to you. Not all laboratory results come back in the same time frame and the provider may be waiting for multiple results in order to interpret others.  Please give [place] 48 hours in order for your provider to thoroughly review all the results before contacting the office for clarification of your results.

## 2022-05-02 NOTE — Telephone Encounter (Signed)
   Telephone encounter was:  Successful.  05/02/2022 Name: Kristie Thornton MRN: [id number] DOB: [birth year]  Kristie Thornton is a 31 y.o. year old female who is a primary care patient of McElwee, Lauren A, NP . The community resource team was consulted for assistance with Food Insecurity, Financial Difficulties related to bills, and housing  Care guide performed the following interventions: Patient provided with information about care guide support team and interviewed to confirm resource needs. Patient is looking for assistance to move but needs financial support and food assistance too.  Follow Up Plan:  Care guide will outreach resources to assist patient with above needs.  [facility] Guide, Embedded Care Coordination [facility]  [place], [place] [address]  Main Phone: [phone]  E-mail: [email or web address]  Website: [email or web address]

## 2022-05-02 NOTE — Progress Notes (Signed)
GASTROENTEROLOGY OUTPATIENT CLINIC VISIT   Primary Care Provider Gerre Scull, NP [address] [place] [address] [phone]  Patient Profile: Kristie Thornton is a 31 y.o. female with a pmh significant for anxiety, MDD, IBS-C v CIC.  The patient presents to the [facility] for an evaluation and management of problem(s) noted below:  Problem List 1. Change in bowel habits   2. Irritable bowel syndrome with constipation   3. Chronic idiopathic constipation   4. BRBPR (bright red blood per rectum)      History of Present Illness Please see prior notes for full details of HPI.  Interval History The patient returns for follow-up.  Unfortunately there have been a lot of life stressors that have occurred.  She has lost her job recently and she is at home taking care of her family who has been unwell.  There have been financial issues as well for the patient regards to her medications and needs.  She does report that when she did get the hyoscyamine and was using that helped her move her bowels more frequently and help with her discomfort.  She is not sure if the Linzess actually helped her significantly.  Unfortunately the hyoscyamine she reports is not covered by her insurance so she did not ask for refill or update [place] once she was denied the ability to get the hyoscyamine.  She and her family have recently had issues from a GI illness perspective where she was having bowel movements more frequently.  She has not noted any blood in her stools at this point.  She is hoping to continue to feel better and if there are any other medicines that are available she is willing to consider.  Her insurance company will no longer be providing her insurance as of the end of August so she will be looking for a new insurance company soon.  GI Review of Systems Positive as above including infrequent episodes of pyrosis Negative for dysphagia, odynophagia, melena  Review  of Systems General: Denies fevers/chills/weight loss unintentionally HEENT: Denies oral lesions Cardiovascular: Denies chest pain Pulmonary: Denies shortness of breath Gastroenterological: See HPI Genitourinary: Denies darkened urine Hematological: Denies easy bruising/bleeding Endocrine: Denies temperature intolerance Dermatological: Denies jaundice Psychological: Mood is stable   Medications Current Outpatient Medications  Medication Sig Dispense Refill   buPROPion (WELLBUTRIN XL) 150 MG 24 hr tablet Take 1 tablet (150 mg total) by mouth daily. 30 tablet 0   clonazePAM (KLONOPIN) 0.5 MG tablet Take 1 tablet (0.5 mg total) by mouth 2 (two) times daily as needed for anxiety. 60 tablet 0   dicyclomine (BENTYL) 20 MG tablet Take 1 tablet (20 mg total) by mouth every 6 (six) hours as needed for spasms. 60 tablet 6   gabapentin (NEURONTIN) 600 MG tablet Take 1 tablet (600 mg total) by mouth 3 (three) times daily. Take 1 tablet in the morning, 1 tablet in the afternoon, and 1.5 tablets at bedtime 270 tablet 1   hydrOXYzine (ATARAX) 10 MG tablet Take 1-2 tablets (10-20 mg total) by mouth at bedtime as needed. 40 tablet 0   hyoscyamine (LEVSIN) 0.125 MG tablet TAKE 1 TABLET(0.125 MG) BY MOUTH EVERY 4 HOURS AS NEEDED 90 tablet 1   ibuprofen (ADVIL) 800 MG tablet Take 800 mg by mouth 3 (three) times daily.     nitrofurantoin, macrocrystal-monohydrate, (MACROBID) 100 MG capsule Take 1 capsule (100 mg total) by mouth 2 (two) times daily. 10 capsule 0   polyethylene glycol  powder (GLYCOLAX/MIRALAX) 17 GM/SCOOP powder Take 17 g by mouth daily as needed. 3350 g 0   promethazine (PHENERGAN) 12.5 MG tablet Take 1 tablet (12.5 mg total) by mouth every 8 (eight) hours as needed for nausea or vomiting. 30 tablet 2   QUEtiapine (SEROQUEL) 50 MG tablet Take 1 tablet (50 mg total) by mouth at bedtime. Start 1/2 tablet at bedtime for 7 days and then take 1 tablet at bedtime 90 tablet 1   trimethoprim-polymyxin b  (POLYTRIM) ophthalmic solution Place 1 drop into the right eye every 6 (six) hours. 10 mL 0   No current facility-administered medications for this visit.    Allergies Allergies  Allergen Reactions   Lexapro [Escitalopram] Other (See Comments)    suicidal   Sertraline Hcl Other (See Comments)    Suicidality    Histories Past Medical History:  Diagnosis Date   Abscess    sub areolar left breast   Anxiety    Back pain    Carpal tunnel syndrome during pregnancy 06/03/2017   Chlamydia infection affecting pregnancy, antepartum 03/26/2017   April 2018- POS [x]  TOC neg   Depression    Gestational hypertension 10/08/2017   History of IBS    PTSD (post-traumatic stress disorder)    Suicide and self-inflicted injury (HCC)    when a teenager   Vacuum extractor delivery, delivered 10/06/2017   Past Surgical History:  Procedure Laterality Date   NO PAST SURGERIES     Social History   Socioeconomic History   Marital status: Divorced    Spouse name: n/a   Number of children: 0   Years of education: 12th +   Highest education level: Not on file  Occupational History   Occupation: 14/07/2017: STUDENT    Comment: [employer]  Tobacco Use   Smoking status: Former    Types: Cigarettes   Smokeless tobacco: Never   Tobacco comments:    stopped 02/2017 previously vaped  Vaping Use   Vaping Use: Every day  Substance and Sexual Activity   Alcohol use: No    Alcohol/week: 0.0 standard drinks of alcohol    Comment: stopped 01/2017 occasional   Drug use: No    Types: Marijuana    Comment: 03/12/17 last week ago, trying to stop   Sexual activity: Yes    Partners: Male    Birth control/protection: None  Other Topics Concern   Not on file  Social History Narrative   Parents are from [place]. Parents and her siblings were born in the [place].   Nail tech   Marital status: divorced      Children: one daughter     Lives: with duaghter      Employment: nail tech      Tobacco:  None       Alcohol: weekends; no DWIs      Drugs: marijuana socially         Social Determinants of Health   Financial Resource Strain: Not on file  Food Insecurity: Not on file  Transportation Needs: Not on file  Physical Activity: Not on file  Stress: Not on file  Social Connections: Not on file  Intimate Partner Violence: Not on file   Family History  Problem Relation Age of Onset   Cancer Mother        colon cancer   Colon polyps Mother    Stroke Father    Cancer Father 40       testicular cancer  Liver disease Sister    Fibromyalgia Sister    Pancreatic disease Sister    ADD / ADHD Sister    ADD / ADHD Sister    ADD / ADHD Brother    Colon cancer Neg Hx    Esophageal cancer Neg Hx    Inflammatory bowel disease Neg Hx    Pancreatic cancer Neg Hx    Rectal cancer Neg Hx    Stomach cancer Neg Hx    I have reviewed her medical, social, and family history in detail and updated the electronic medical record as necessary.    PHYSICAL EXAMINATION  BP 90/60   Pulse 90   Ht 5' (1.524 m)   Wt 114 lb (51.7 kg)   BMI 22.26 kg/m  Wt Readings from Last 3 Encounters:  05/02/22 114 lb (51.7 kg)  05/01/22 114 lb (51.7 kg)  04/26/22 115 lb 6.4 oz (52.3 kg)  GEN: NAD, appears stated age, doesn't appear chronically ill PSYCH: Cooperative, without pressured speech EYE: Conjunctivae pink, sclerae anicteric ENT: MMM CV: Nontachycardic RESP: No audible wheezing GI: NABS, soft, slightly protuberant, without rebound or guarding GU: DRE deferred by patient previously MSK/EXT: No lower extremity edema SKIN: No jaundice NEURO:  Alert & Oriented x 3, no focal deficits   REVIEW OF DATA  I reviewed the following data at the time of this encounter:  GI Procedures and Studies  No relevant studies to review  Laboratory Studies  Reviewed those in epic  Imaging Studies  No new studies to review   ASSESSMENT  Ms. Abee is a 31 y.o. female with a pmh significant for anxiety, MDD,  IBS-C v CIC.  The patient is seen today for evaluation and management of:  1. Change in bowel habits   2. Irritable bowel syndrome with constipation   3. Chronic idiopathic constipation   4. BRBPR (bright red blood per rectum)    Patient remains hemodynamically stable.  We are going to transition her hyoscyamine to dicyclomine and see if that would be covered by her insurance.  If not she will update [place] and then we will try to send a larger prescription using good Rx to her main pharmacy.  She will update [place] in the next few days as to what she thinks will be able to be done but we will send the dicyclomine today.  We are going to give this patient some samples of Linzess 290 mcg so that if the Bentyl does not help her with her bowel habits then she may trial that.  She has already failed MiraLAX and Dulcolax and stool softeners and what appears to be a appropriate 145 mcg Linzess trial.  Other medications may need to be considered.  In regards to her symptoms although she is somewhat better than she was before and she is not having any rectal bleeding currently, diagnostic colonoscopy is still a recommendation.  We will move forward with that procedure in the next few weeks and she is going to touch base with her insurance to see what her copayment may be.  The risks and benefits of endoscopic evaluation were discussed with the patient; these include but are not limited to the risk of perforation, infection, bleeding, missed lesions, lack of diagnosis, severe illness requiring hospitalization, as well as anesthesia and sedation related illnesses.  The patient and/or family is agreeable to proceed.  All patient questions were answered to the best of my ability, and the patient agrees to the aforementioned plan of action  with follow-up as indicated.   PLAN  Toileting techniques re-discussed with patient Bentyl 20 mg 1-4 times daily as needed -If this does not work we may send a good Rx hyoscyamine  prescription as she may be able to afford that As needed Phenergan Linzess 290 mcg samples to be given to patient If patient does well can proceed with a prescription or can titrate or change medications Laboratories as outlined below to be performed at some point Diagnostic colonoscopy to be scheduled GERD symptoms better controlled at this time and not needing medications   Orders Placed This Encounter  Procedures   Ambulatory referral to Gastroenterology   Ambulatory referral to Gastroenterology    New Prescriptions   DICYCLOMINE (BENTYL) 20 MG TABLET    Take 1 tablet (20 mg total) by mouth every 6 (six) hours as needed for spasms.   Modified Medications   No medications on file    Planned Follow Up No follow-ups on file.   Total Time in Face-to-Face and in Coordination of Care for patient including independent/personal interpretation/review of prior testing, medical history, examination, medication adjustment, communicating results with the patient directly, and documentation within the EHR is 25 minutes.  Corliss Parish, MD  Gastroenterology [facility] Office # [phone]

## 2022-05-03 ENCOUNTER — Encounter: Payer: Self-pay | Admitting: Gastroenterology

## 2022-05-03 DIAGNOSIS — R194 Change in bowel habit: Secondary | ICD-10-CM | POA: Insufficient documentation

## 2022-05-03 DIAGNOSIS — K625 Hemorrhage of anus and rectum: Secondary | ICD-10-CM | POA: Insufficient documentation

## 2022-05-03 DIAGNOSIS — K5904 Chronic idiopathic constipation: Secondary | ICD-10-CM | POA: Insufficient documentation

## 2022-05-09 ENCOUNTER — Encounter: Payer: Self-pay | Admitting: Gastroenterology

## 2022-05-10 ENCOUNTER — Other Ambulatory Visit: Payer: Self-pay

## 2022-05-10 MED ORDER — NA SULFATE-K SULFATE-MG SULF 17.5-3.13-1.6 GM/177ML PO SOLN: 1.0000 | ORAL | 0 refills | Status: DC

## 2022-05-23 ENCOUNTER — Telehealth: Payer: Self-pay | Admitting: Gastroenterology

## 2022-05-23 NOTE — Telephone Encounter (Signed)
Spoken with patient and informed patient about GoodRx and the discount that the pharmacy has on her profile.

## 2022-05-23 NOTE — Telephone Encounter (Signed)
Patient called asking if there is an alternative for her prep medication. States her insurance does not cover the Suprep.

## 2022-05-24 ENCOUNTER — Telehealth: Payer: Self-pay

## 2022-05-24 NOTE — Telephone Encounter (Signed)
   Telephone encounter was:  Successful.  05/24/2022 Name: Kristie Thornton MRN: [id number] DOB: 05-18-1991  Kristie Thornton is a 31 y.o. year old female who is a primary care patient of McElwee, Lauren A, NP . The community resource team was consulted for assistance with Food Insecurity, Financial Difficulties related to bills, and Housing  Care guide performed the following interventions: Follow up call placed to the patient to discuss status of referral. CG advised all resources/referrals sent by e-mail per patients request. CG sent [place] and [employer], Scientific laboratory technician Resources for both counties. Patient consented to [facility] referrals as well and two were sent. One to [employer] and the [employer]. DSS information for childcare vouchers for both counties were sent as well.  Follow Up Plan:  No further follow up planned at this time. The patient has been provided with needed resources.  [facility] Guide, Embedded Care Coordination [facility]  [place], [place] [address]  Main Phone: [phone]  E-mail: [email or web address]  Website: [email or web address]

## 2022-05-28 ENCOUNTER — Telehealth (HOSPITAL_BASED_OUTPATIENT_CLINIC_OR_DEPARTMENT_OTHER): Payer: 59 | Admitting: Psychiatry

## 2022-05-28 DIAGNOSIS — F902 Attention-deficit hyperactivity disorder, combined type: Secondary | ICD-10-CM

## 2022-05-28 DIAGNOSIS — F411 Generalized anxiety disorder: Secondary | ICD-10-CM

## 2022-05-28 NOTE — Progress Notes (Signed)
Virtual Visit via Video Note  I connected with Kristie Thornton on 05/28/22 at 11:20 AM EDT by a video enabled telemedicine application and verified that I am speaking with the correct person using two identifiers.  Location: Patient: In Car Provider: Home Office   I discussed the limitations of evaluation and management by telemedicine and the availability of in person appointments. The patient expressed understanding and agreed to proceed.  History of Present Illness: Patient is 31 year old [place] American divorced female who was seen first time 5 weeks ago.  She was not happy with the last provider at [facility].  She wanted to have ADHD medication and the previous provider diagnosed her with bipolar disorder.  We have referred her for psychological testing.  Today when I connect with a video she was in the car and there was a lot of background noise.  She was a passenger and I requested to turn off the background noise.  Patient got upset and she that she is dropping her daughter and not driving.  I ask about trial of Wellbutrin and hydroxyzine but patient told medicine not working and she feels tired when she takes hydroxyzine.  Patient then started walking when the car stop and there was a interaction of the session due to Internet issues.  I requested that she should stay at 1 place so we can continue conversation and then she got upset again and replied "I am like this and I cannot stay in one place and doctor knows me well that I have to keep walking".  She reported a lot of stress lately because she is supposed to move out from her mother's home but new place was not livable and she has to stay with her mother.  During the session she was keep getting distracted and resection was in and out.  I again request her that we need to have session when not so much distraction and just stay one place so I can hear her very well.  Patient again got more upset and hang up the phone.   Past Psychiatric  History: Diagnosed ADHD in school age by PCP and given Adderall and Vyvanse. No h/o suicidal attempt but hx of suicidal thoughts when took sertraline given by provider.  Tried to establish care at [facility] but did not like the provider after given the diagnosis of bipolar.  PCP tried Seroquel No history of inpatient treatment   Psychiatric Specialty Exam: Physical Exam  Review of Systems  unknown if currently breastfeeding.There is no height or weight on file to calculate BMI.  General Appearance: Fairly Groomed and superficially cooperative  Eye Contact:  Fair  Speech:  Pressured  Volume:  Increased  Mood:  Irritable  Affect:  Labile  Thought Process:  Descriptions of Associations: Circumstantial  Orientation:  Full (Time, Place, and Person)  Thought Content:  distraction and rumination.  Suicidal Thoughts:  No  Homicidal Thoughts:  No  Memory:  Immediate;   Fair Recent;   Fair Remote;   Fair  Judgement:  Impaired  Insight:  Lacking  Psychomotor Activity:  Increased  Concentration:  Concentration: Fair and Attention Span: Fair  Recall:  [employer] of Knowledge:  Fair  Language:  Good  Akathisia:  No  Handed:  Right  AIMS (if indicated):     Assets:  Housing Transportation  ADL's:  Intact  Cognition:  WNL  Sleep:   ok with melatonin and hydroxyzine.      Assessment and Plan: Bipolar  disorder.  Anxiety disorder.  Rule out  ADHD, combined type.  Patient hang up the phone after she was told to stay at one place for better perception.  She became very upset and replied that I am done talking with you.  Patient saw previous provider and did not like when she was given the diagnosis of bipolar disorder.  We will contact her if she is interested to reschedule appointment otherwise we will refer her out.  No new medication given.  Follow Up Instructions:    I discussed the assessment and treatment plan with the patient. The patient was provided an opportunity to ask  questions and all were answered. The patient agreed with the plan and demonstrated an understanding of the instructions.   The patient was advised to call back or seek an in-person evaluation if the symptoms worsen or if the condition fails to improve as anticipated.  I provided 15 minutes of non-face-to-face time during this encounter.   Cleotis Nipper, MD

## 2022-05-31 ENCOUNTER — Telehealth: Payer: Self-pay | Admitting: Gastroenterology

## 2022-05-31 NOTE — Telephone Encounter (Signed)
Inbound call from patient cancelling upcoming procedure for 06/05/22 due to her not having a care partner for herself or her children. Patient states she will call back to reschedule.  Thank you

## 2022-05-31 NOTE — Telephone Encounter (Signed)
Noted  

## 2022-06-05 ENCOUNTER — Encounter: Payer: 59 | Admitting: Gastroenterology

## 2022-06-07 ENCOUNTER — Encounter: Payer: Self-pay | Admitting: Gastroenterology

## 2022-08-20 ENCOUNTER — Encounter: Payer: Self-pay | Admitting: Nurse Practitioner

## 2022-08-26 ENCOUNTER — Ambulatory Visit (INDEPENDENT_AMBULATORY_CARE_PROVIDER_SITE_OTHER): Payer: Medicaid Other | Admitting: Nurse Practitioner

## 2022-08-26 ENCOUNTER — Encounter: Payer: Self-pay | Admitting: Nurse Practitioner

## 2022-08-26 VITALS — BP 92/58 | HR 104 | Temp 96.3°F | Wt 122.6 lb

## 2022-08-26 DIAGNOSIS — G8929 Other chronic pain: Secondary | ICD-10-CM

## 2022-08-26 DIAGNOSIS — N912 Amenorrhea, unspecified: Secondary | ICD-10-CM | POA: Diagnosis not present

## 2022-08-26 DIAGNOSIS — K5909 Other constipation: Secondary | ICD-10-CM | POA: Diagnosis not present

## 2022-08-26 DIAGNOSIS — G4700 Insomnia, unspecified: Secondary | ICD-10-CM | POA: Insufficient documentation

## 2022-08-26 DIAGNOSIS — M546 Pain in thoracic spine: Secondary | ICD-10-CM

## 2022-08-26 MED ORDER — TRAZODONE HCL 50 MG PO TABS: 25.0000 mg | ORAL_TABLET | Freq: Every evening | ORAL | 3 refills | Status: DC | PRN

## 2022-08-26 NOTE — Assessment & Plan Note (Signed)
Chronic, stable.  Continue gabapentin 600 mg at bedtime.  Follow-up if symptoms worsen or any concerns.

## 2022-08-26 NOTE — Assessment & Plan Note (Signed)
She has not had a menstrual period in several months.  She has had several negative pregnancy tests.  She has a family history of early menopause.  We will place referral to GYN.  She declines labs today.

## 2022-08-26 NOTE — Patient Instructions (Addendum)
It was great to see you!  Take 1/2 tablet to 1 tablet of trazodone at bedtime as needed for sleep.  I placed a referral to GYN.   Let's follow-up in 2-3 months, sooner if you have concerns.  If a referral was placed today, you will be contacted for an appointment. Please note that routine referrals can sometimes take up to 3-4 weeks to process. Please call our office if you haven't heard anything after this time frame.  Take care,  Vance Peper, NP

## 2022-08-26 NOTE — Progress Notes (Signed)
Established Patient Office Visit  Subjective   Patient ID: Kristie Thornton, female    DOB: [birth year]  Age: 31 y.o. MRN: [id number]  Chief Complaint  Patient presents with   Medication Refill    Pt wants to discuss trazodone medication to help with sleeping.     HPI  Kristie Thornton is here to discuss insomina. She states that she has been having trouble sleeping for the past few months.  She has been experiencing hot flashes and has not had her period in several months.  She is done multiple pregnancy tests which were negative.  She does have a history of early menopause in her family.  Her sister takes trazodone and she was wondering if she could try this to help with her insomnia.  She states that her anxiety has improved.  She was seeing a psychiatrist, however she states that she did not feel good connection with them.  Currently, she is only taking gabapentin daily for back pain.     ROS See pertinent positives and negatives per HPI.    Objective:     BP (!) 92/58   Pulse (!) 104   Temp (!) 96.3 F (35.7 C) (Temporal)   Wt 122 lb 9.6 oz (55.6 kg)   SpO2 98%   BMI 23.94 kg/m    Physical Exam Vitals and nursing note reviewed.  Constitutional:      General: She is not in acute distress.    Appearance: Normal appearance.  HENT:     Head: Normocephalic.  Eyes:     Conjunctiva/sclera: Conjunctivae normal.  Cardiovascular:     Rate and Rhythm: Normal rate and regular rhythm.     Pulses: Normal pulses.     Heart sounds: Normal heart sounds.  Pulmonary:     Effort: Pulmonary effort is normal.     Breath sounds: Normal breath sounds.  Musculoskeletal:     Cervical back: Normal range of motion.  Skin:    General: Skin is warm.  Neurological:     General: No focal deficit present.     Mental Status: She is alert and oriented to person, place, and time.  Psychiatric:        Mood and Affect: Mood normal.        Behavior: Behavior normal.        Thought Content: Thought  content normal.        Judgment: Judgment normal.      Assessment & Plan:   Problem List Items Addressed This Visit       Digestive   Chronic constipation    Chronic, improving.  She states that she had appointment with GI and was switched from Zofran to Phenergan and this is helped with her constipation.        Other   Chronic midline thoracic back pain    Chronic, stable.  Continue gabapentin 600 mg at bedtime.  Follow-up if symptoms worsen or any concerns.      Relevant Medications   traZODone (DESYREL) 50 MG tablet   Insomnia - Primary    She has been experiencing some trouble sleeping for the past few months.  Discussed sleep hygiene, limiting overall well.  We will also start trazodone 25 to 50 mg as needed at bedtime for sleep.  Follow-up in 3 months.      Amenorrhea    She has not had a menstrual period in several months.  She has had several negative pregnancy tests.  She has a  family history of early menopause.  We will place referral to GYN.  She declines labs today.      Relevant Orders   Ambulatory referral to Gynecology    Return in about 2 months (around 10/26/2022) for 2-3 months insomnia.    Gerre Scull, NP

## 2022-08-26 NOTE — Assessment & Plan Note (Signed)
Chronic, improving.  She states that she had appointment with GI and was switched from Zofran to Phenergan and this is helped with her constipation.

## 2022-08-26 NOTE — Assessment & Plan Note (Signed)
She has been experiencing some trouble sleeping for the past few months.  Discussed sleep hygiene, limiting overall well.  We will also start trazodone 25 to 50 mg as needed at bedtime for sleep.  Follow-up in 3 months.

## 2022-09-11 ENCOUNTER — Telehealth: Payer: Self-pay | Admitting: Nurse Practitioner

## 2022-09-11 ENCOUNTER — Ambulatory Visit: Payer: Medicaid Other | Admitting: Nurse Practitioner

## 2022-09-11 ENCOUNTER — Other Ambulatory Visit: Payer: Self-pay | Admitting: Nurse Practitioner

## 2022-09-11 MED ORDER — CLONAZEPAM 0.5 MG PO TABS
0.5000 mg | ORAL_TABLET | Freq: Two times a day (BID) | ORAL | 0 refills | Status: DC | PRN
Start: 2022-09-11 — End: 2022-11-11

## 2022-09-11 MED ORDER — IBUPROFEN 800 MG PO TABS
800.0000 mg | ORAL_TABLET | Freq: Three times a day (TID) | ORAL | 1 refills | Status: DC | PRN
Start: 2022-09-11 — End: 2023-02-11

## 2022-09-11 NOTE — Telephone Encounter (Signed)
Pt was a no show 11/15 for an OV with McElwee. This is her second (6/28)

## 2022-09-24 ENCOUNTER — Ambulatory Visit: Payer: Medicaid Other | Admitting: Nurse Practitioner

## 2022-09-24 ENCOUNTER — Telehealth: Payer: Self-pay | Admitting: Nurse Practitioner

## 2022-09-24 NOTE — Telephone Encounter (Signed)
Noted  

## 2022-09-24 NOTE — Telephone Encounter (Signed)
Prior appt 6/29 not counted as no show, it was made and cancelled same day as pts supervisor would not allow to leave for the appt  1st no show 11/15, letter sent

## 2022-09-24 NOTE — Telephone Encounter (Signed)
Pt was a no show for an OV with Lauren on 09/24/22, I sent a letter.

## 2022-09-30 ENCOUNTER — Encounter: Payer: Self-pay | Admitting: Nurse Practitioner

## 2022-09-30 NOTE — Telephone Encounter (Signed)
2nd no show, fee cannot be generated (M[employer] ins), final warning letter sent via mail and mychart

## 2022-10-01 NOTE — Telephone Encounter (Signed)
Noted  

## 2022-10-25 DIAGNOSIS — N390 Urinary tract infection, site not specified: Secondary | ICD-10-CM | POA: Diagnosis not present

## 2022-10-25 DIAGNOSIS — N898 Other specified noninflammatory disorders of vagina: Secondary | ICD-10-CM | POA: Diagnosis not present

## 2022-11-08 ENCOUNTER — Other Ambulatory Visit: Payer: Self-pay | Admitting: Nurse Practitioner

## 2022-12-17 ENCOUNTER — Ambulatory Visit
Admission: EM | Admit: 2022-12-17 | Discharge: 2022-12-17 | Disposition: A | Discharge status: Home / Self Care | Payer: Medicaid Other | Attending: Family Medicine | Admitting: Family Medicine

## 2022-12-17 ENCOUNTER — Other Ambulatory Visit: Payer: Self-pay

## 2022-12-17 DIAGNOSIS — N760 Acute vaginitis: Secondary | ICD-10-CM | POA: Insufficient documentation

## 2022-12-17 DIAGNOSIS — B9689 Other specified bacterial agents as the cause of diseases classified elsewhere: Secondary | ICD-10-CM | POA: Insufficient documentation

## 2022-12-17 DIAGNOSIS — Z3202 Encounter for pregnancy test, result negative: Secondary | ICD-10-CM | POA: Insufficient documentation

## 2022-12-17 LAB — URINALYSIS, W/ REFLEX TO CULTURE (INFECTION SUSPECTED)
Bilirubin Urine: NEGATIVE
Glucose, UA: NEGATIVE mg/dL
Hgb urine dipstick: NEGATIVE
Ketones, ur: NEGATIVE mg/dL
Leukocytes,Ua: NEGATIVE
Nitrite: NEGATIVE
Protein, ur: NEGATIVE mg/dL
Specific Gravity, Urine: 1.02 (ref 1.005–1.030)
pH: 8.5 — ABNORMAL HIGH (ref 5.0–8.0)

## 2022-12-17 LAB — WET PREP, GENITAL
Sperm: NONE SEEN
Trich, Wet Prep: NONE SEEN
WBC, Wet Prep HPF POC: NONE SEEN — AB (ref <10)
Yeast Wet Prep HPF POC: NONE SEEN

## 2022-12-17 LAB — PREGNANCY, URINE: Preg Test, Ur: NEGATIVE

## 2022-12-17 MED ORDER — METRONIDAZOLE 0.75 % VA GEL: 1.0000 | Freq: Every day | VAGINAL | 0 refills | Status: DC

## 2022-12-17 NOTE — Discharge Instructions (Addendum)
You are not pregnant nor do you have a urinary tract infection or a yeast infection. You have BV/bacterial vaginosis.   Someone will contact you if your STD tests are positive. Be sure to follow up with your gynecologist for a repeat PAP smear.

## 2022-12-17 NOTE — ED Provider Notes (Signed)
[facility]    CSN: [id number] Arrival date & time: 12/17/22  1401      History   Chief Complaint No chief complaint on file.    HPI HPI Kristie Thornton is a 32 y.o. female.    Tiombe Robuck presents for cloudy milky discharge, back pain and abdominal pain that started about a week ago. Has some vaginal irritation.   She tried metronidazole gel for 1 night which didn't help. No burning with urination.    Denies known STI exposure.   Reports no symptoms in her partner. Rozann has used condoms mostly regularly.  She took 2 pregnancy test.   - Contraception: Depo provera injection  - Missed period:  Patient's last menstrual period was 01/23/2022. - Abnormal vaginal discharge: no - vaginal bleeding: no - Dysuria: no - Hematuria: no - Urinary urgency: no - Urinary frequency: no  - Fever: no - Abdominal pain : yes - Pelvic pain: yes  - Rash/Skin lesions/mouth ulcers: no - Nausea: no  - Vomiting: no  - Back Pain: yes - Breast tenderness: no  - Headache: no       Past Medical History:  Diagnosis Date   Abscess    sub areolar left breast   Anxiety    Back pain    Carpal tunnel syndrome during pregnancy 06/03/2017   Chlamydia infection affecting pregnancy, antepartum 03/26/2017   April 2018- POS '[x]'$  TOC neg   Depression    Gestational hypertension 10/08/2017   History of IBS    PTSD (post-traumatic stress disorder)    Suicide and self-inflicted injury ([place])    when a teenager   Vacuum extractor delivery, delivered 10/06/2017    Patient Active Problem List   Diagnosis Date Noted   Insomnia 08/26/2022   Amenorrhea 08/26/2022   Change in bowel habits 05/03/2022   BRBPR (bright red blood per rectum) 05/03/2022   Chronic idiopathic constipation 05/03/2022   Financial difficulties 05/01/2022   Gross hematuria 04/03/2022   Pyrosis 03/06/2022   Decreased appetite 03/06/2022   Bilious vomiting with nausea 03/06/2022   Rectal bleeding 03/06/2022   Chronic bilateral  lower abdominal pain 03/06/2022   Chronic constipation 03/06/2022   LLQ abdominal pain 02/19/2022   Hordeolum externum of right upper eyelid 01/16/2022   Bipolar 1 disorder ([place]) 12/10/2021   Chronic midline thoracic back pain 11/20/2021   BV (bacterial vaginosis) 11/20/2021   Birth control counseling 11/20/2021   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   Vitamin D deficiency 11/20/2021   Vitamin B12 deficiency 11/20/2021   Vapes nicotine containing substance 11/20/2021   History of narcotic use 09/10/2017   Generalized anxiety disorder 08/08/2014   Endometriosis 08/08/2014    Past Surgical History:  Procedure Laterality Date   NO PAST SURGERIES      OB History     Gravida  1   Para  1   Term  1   Preterm      AB      Living  1      SAB      IAB      Ectopic      Multiple  0   Live Births  1            Home Medications    Prior to Admission medications   Medication Sig Start Date End Date Taking? Authorizing Provider  metroNIDAZOLE (METROGEL) 0.75 % vaginal gel Place 1 Applicatorful vaginally at bedtime. 12/17/22  Yes Skyanne Welle, Ronnette Juniper, DO  clonazePAM (  KLONOPIN) 0.5 MG tablet TAKE 1 TABLET(0.5 MG) BY MOUTH TWICE DAILY AS NEEDED FOR ANXIETY 11/11/22   McElwee, Lauren A, NP  gabapentin (NEURONTIN) 600 MG tablet Take 1 tablet (600 mg total) by mouth 3 (three) times daily. Take 1 tablet in the morning, 1 tablet in the afternoon, and 1.5 tablets at bedtime 04/26/22   McElwee, Lauren A, NP  ibuprofen (ADVIL) 800 MG tablet Take 1 tablet (800 mg total) by mouth 3 (three) times daily as needed for mild pain or moderate pain. 09/11/22   McElwee, Scheryl Darter, NP    Family History Family History  Problem Relation Age of Onset   Cancer Mother        colon cancer   Colon polyps Mother    Stroke Father    Cancer Father 20       testicular cancer   Liver disease Sister    Fibromyalgia Sister    Pancreatic disease Sister    ADD / ADHD Sister    ADD /  ADHD Sister    ADD / ADHD Brother    Colon cancer Neg Hx    Esophageal cancer Neg Hx    Inflammatory bowel disease Neg Hx    Pancreatic cancer Neg Hx    Rectal cancer Neg Hx    Stomach cancer Neg Hx     Social History Social History   Tobacco Use   Smoking status: Former    Types: Cigarettes   Smokeless tobacco: Never   Tobacco comments:    stopped 02/2017 previously vaped  Vaping Use   Vaping Use: Every day  Substance Use Topics   Alcohol use: No    Alcohol/week: 0.0 standard drinks of alcohol    Comment: stopped 01/2017 occasional   Drug use: No    Types: Marijuana    Comment: 03/12/17 last week ago, trying to stop     Allergies   Lexapro [escitalopram] and Sertraline hcl   Review of Systems Review of Systems: :negative unless otherwise stated in HPI.      Physical Exam Triage Vital Signs ED Triage Vitals  Enc Vitals Group     BP 12/17/22 1433 114/80     Pulse Rate 12/17/22 1433 97     Resp 12/17/22 1433 18     Temp 12/17/22 1433 99.1 F (37.3 C)     Temp src --      SpO2 --      Weight --      Height --      Head Circumference --      Peak Flow --      Pain Score 12/17/22 1429 0     Pain Loc --      Pain Edu? --      Excl. in [place]? --    No data found.  Updated Vital Signs BP 114/80   Pulse 97   Temp 99.1 F (37.3 C)   Resp 18   LMP 01/23/2022 Comment: Has not had menses for 1 year after recieving depo shot.  Visual Acuity Right Eye Distance:   Left Eye Distance:   Bilateral Distance:    Right Eye Near:   Left Eye Near:    Bilateral Near:     Physical Exam GEN: well appearing female in no acute distress  CVS: well perfused, regular rate   RESP: speaking in full sentences without pause  ABD: soft, non-tender, non-distended, no palpable masses   GU:  uterus below the umbilicus, deferred internal exam,  patient performed self swab  SKIN: warm, dry    UC Treatments / Results  Labs (all labs ordered are listed, but only abnormal  results are displayed) Labs Reviewed  WET PREP, GENITAL - Abnormal; Notable for the following components:      Result Value   Clue Cells Wet Prep HPF POC PRESENT (*)    WBC, Wet Prep HPF POC NONE SEEN (*)    All other components within normal limits  URINALYSIS, W/ REFLEX TO CULTURE (INFECTION SUSPECTED) - Abnormal; Notable for the following components:   APPearance CLOUDY (*)    pH 8.5 (*)    Bacteria, UA FEW (*)    All other components within normal limits  PREGNANCY, URINE  CERVICOVAGINAL ANCILLARY ONLY    EKG   Radiology No results found.  Procedures Procedures (including critical care time)  Medications Ordered in UC Medications - No data to display  Initial Impression / Assessment and Plan / UC Course  I have reviewed the triage vital signs and the nursing notes.  Pertinent labs & imaging results that were available during my care of the patient were reviewed by me and considered in my medical decision making (see chart for details).      Patient is a 32 y.o.Marland Kitchen female  who presents for vaginal irritation with back and abdominal pain.  Overall patient is well-appearing and afebrile.  Vital signs stable.  UA not consistent with acute cystitis.   No yeast vaginitis but bacterial vaginitis confirmed on wet prep.  Gonorrhea and Chlamydia testing obtained.  Urine pregnancy test is negative.  - Treatment: Metrogel  - G/C Chlamydia and call in Rx if positive.    Return precautions including abdominal pain, fever, chills, nausea, or vomiting given. Discussed MDM, treatment plan and plan for follow-up with patient/parent who agrees with plan.        Final Clinical Impressions(s) / UC Diagnoses   Final diagnoses:  BV (bacterial vaginosis)  Encounter for pregnancy test with result negative     Discharge Instructions      You are not pregnant nor do you have a urinary tract infection or a yeast infection. You have BV/bacterial vaginosis.   Someone will contact you  if your STD tests are positive. Be sure to follow up with your gynecologist for a repeat PAP smear.       ED Prescriptions     Medication Sig Dispense Auth. Provider   metroNIDAZOLE (METROGEL) 0.75 % vaginal gel Place 1 Applicatorful vaginally at bedtime. 70 g Lyndee Hensen, DO      PDMP not reviewed this encounter.   Lyndee Hensen, DO 12/20/22 1520

## 2022-12-17 NOTE — ED Triage Notes (Signed)
Pt states she is experiencing milky vaginal discharge and odor with itchiness

## 2022-12-18 LAB — CERVICOVAGINAL ANCILLARY ONLY
Chlamydia: NEGATIVE
Comment: NEGATIVE
Comment: NORMAL
Neisseria Gonorrhea: NEGATIVE

## 2023-01-27 ENCOUNTER — Ambulatory Visit
Admission: EM | Admit: 2023-01-27 | Discharge: 2023-01-27 | Disposition: A | Discharge status: Home / Self Care | Payer: Medicaid Other | Attending: Family Medicine | Admitting: Family Medicine

## 2023-01-27 ENCOUNTER — Encounter: Payer: Self-pay | Admitting: Emergency Medicine

## 2023-01-27 DIAGNOSIS — B3731 Acute candidiasis of vulva and vagina: Secondary | ICD-10-CM | POA: Diagnosis not present

## 2023-01-27 DIAGNOSIS — N760 Acute vaginitis: Secondary | ICD-10-CM | POA: Diagnosis not present

## 2023-01-27 DIAGNOSIS — B9689 Other specified bacterial agents as the cause of diseases classified elsewhere: Secondary | ICD-10-CM | POA: Diagnosis not present

## 2023-01-27 LAB — URINALYSIS, W/ REFLEX TO CULTURE (INFECTION SUSPECTED)
Bilirubin Urine: NEGATIVE
Glucose, UA: NEGATIVE mg/dL
Ketones, ur: NEGATIVE mg/dL
Leukocytes,Ua: NEGATIVE
Nitrite: NEGATIVE
Protein, ur: NEGATIVE mg/dL
Specific Gravity, Urine: 1.025 (ref 1.005–1.030)
pH: 6 (ref 5.0–8.0)

## 2023-01-27 LAB — WET PREP, GENITAL
Sperm: NONE SEEN
Trich, Wet Prep: NONE SEEN
WBC, Wet Prep HPF POC: >10 — AB (ref <10)

## 2023-01-27 LAB — PREGNANCY, URINE: Preg Test, Ur: NEGATIVE

## 2023-01-27 MED ORDER — FLUCONAZOLE 150 MG PO TABS: 150.0000 mg | ORAL_TABLET | ORAL | 0 refills | Status: AC

## 2023-01-27 MED ORDER — METRONIDAZOLE 500 MG PO TABS: 500.0000 mg | ORAL_TABLET | Freq: Two times a day (BID) | ORAL | 0 refills | Status: DC

## 2023-01-27 NOTE — ED Provider Notes (Signed)
[facility]    CSN: [id number] Arrival date & time: 01/27/23  1759      History   Chief Complaint Chief Complaint  Patient presents with   Vaginal Discharge   Constipation     HPI HPI Adysen Ragatz is a 32 y.o. female.    Lilya Escudero presents for milky vaginal discharge with malodorous discharge for 3 weeks.  Tried nothing prior to arrival.  Denies known STI exposure.  Marabella does not use condoms regularly. Last period was in February.  Has bloating and constipation.   - vaginal bleeding: no - Dysuria: no - Hematuria: no - Urinary urgency: no - Urinary frequency: no  - Fever: no - Abdominal pain no - Pelvic pain: no - Rash/Skin lesions/mouth ulcers: no - Nausea: no  - Vomiting: no - Back Pain: no  - CVA tenderness  - Headache: no       Past Medical History:  Diagnosis Date   Abscess    sub areolar left breast   Anxiety    Back pain    Carpal tunnel syndrome during pregnancy 06/03/2017   Chlamydia infection affecting pregnancy, antepartum 03/26/2017   April 2018- POS [x]  TOC neg   Depression    Gestational hypertension 10/08/2017   History of IBS    PTSD (post-traumatic stress disorder)    Suicide and self-inflicted injury    when a teenager   Vacuum extractor delivery, delivered 10/06/2017    Patient Active Problem List   Diagnosis Date Noted   Insomnia 08/26/2022   Amenorrhea 08/26/2022   Change in bowel habits 05/03/2022   BRBPR (bright red blood per rectum) 05/03/2022   Chronic idiopathic constipation 05/03/2022   Financial difficulties 05/01/2022   Gross hematuria 04/03/2022   Pyrosis 03/06/2022   Decreased appetite 03/06/2022   Bilious vomiting with nausea 03/06/2022   Rectal bleeding 03/06/2022   Chronic bilateral lower abdominal pain 03/06/2022   Chronic constipation 03/06/2022   LLQ abdominal pain 02/19/2022   Hordeolum externum of right upper eyelid 01/16/2022   Bipolar 1 disorder 12/10/2021   Chronic midline thoracic back pain  11/20/2021   BV (bacterial vaginosis) 11/20/2021   Birth control counseling 11/20/2021   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   Vitamin D deficiency 11/20/2021   Vitamin B12 deficiency 11/20/2021   Vapes nicotine containing substance 11/20/2021   History of narcotic use 09/10/2017   Generalized anxiety disorder 08/08/2014   Endometriosis 08/08/2014    Past Surgical History:  Procedure Laterality Date   NO PAST SURGERIES      OB History     Gravida  1   Para  1   Term  1   Preterm      AB      Living  1      SAB      IAB      Ectopic      Multiple  0   Live Births  1            Home Medications    Prior to Admission medications   Medication Sig Start Date End Date Taking? Authorizing Provider  fluconazole (DIFLUCAN) 150 MG tablet Take 1 tablet (150 mg total) by mouth every 3 (three) days for 2 doses. 01/27/23 01/31/23 Yes Quana Chamberlain, DO  gabapentin (NEURONTIN) 600 MG tablet Take 1 tablet (600 mg total) by mouth 3 (three) times daily. Take 1 tablet in the morning, 1 tablet in the afternoon, and 1.5 tablets at bedtime 04/26/22  Yes McElwee, Lauren A, NP  metroNIDAZOLE (FLAGYL) 500 MG tablet Take 1 tablet (500 mg total) by mouth 2 (two) times daily. 01/27/23  Yes Dmetrius Ambs, DO  clonazePAM (KLONOPIN) 0.5 MG tablet TAKE 1 TABLET(0.5 MG) BY MOUTH TWICE DAILY AS NEEDED FOR ANXIETY 11/11/22   McElwee, Lauren A, NP  ibuprofen (ADVIL) 800 MG tablet Take 1 tablet (800 mg total) by mouth 3 (three) times daily as needed for mild pain or moderate pain. 09/11/22   McElwee, Scheryl Darter, NP    Family History Family History  Problem Relation Age of Onset   Cancer Mother        colon cancer   Colon polyps Mother    Stroke Father    Cancer Father 72       testicular cancer   Liver disease Sister    Fibromyalgia Sister    Pancreatic disease Sister    ADD / ADHD Sister    ADD / ADHD Sister    ADD / ADHD Brother    Colon cancer Neg Hx    Esophageal  cancer Neg Hx    Inflammatory bowel disease Neg Hx    Pancreatic cancer Neg Hx    Rectal cancer Neg Hx    Stomach cancer Neg Hx     Social History Social History   Tobacco Use   Smoking status: Former    Types: Cigarettes   Smokeless tobacco: Never   Tobacco comments:    stopped 02/2017 previously vaped  Vaping Use   Vaping Use: Every day  Substance Use Topics   Alcohol use: No    Alcohol/week: 0.0 standard drinks of alcohol    Comment: stopped 01/2017 occasional   Drug use: No    Types: Marijuana    Comment: 03/12/17 last week ago, trying to stop     Allergies   Lexapro [escitalopram] and Sertraline hcl   Review of Systems Review of Systems: :negative unless otherwise stated in HPI.      Physical Exam Triage Vital Signs ED Triage Vitals  Enc Vitals Group     BP 01/27/23 1918 110/78     Pulse Rate 01/27/23 1918 78     Resp 01/27/23 1918 16     Temp 01/27/23 1918 98.4 F (36.9 C)     Temp Source 01/27/23 1918 Oral     SpO2 01/27/23 1918 100 %     Weight --      Height --      Head Circumference --      Peak Flow --      Pain Score 01/27/23 1915 0     Pain Loc --      Pain Edu? --      Excl. in [place]? --    No data found.  Updated Vital Signs BP 110/78 (BP Location: Left Arm)   Pulse 78   Temp 98.4 F (36.9 C) (Oral)   Resp 16   LMP  (LMP Unknown)   SpO2 100%   Visual Acuity Right Eye Distance:   Left Eye Distance:   Bilateral Distance:    Right Eye Near:   Left Eye Near:    Bilateral Near:     Physical Exam GEN: well appearing female in no acute distress  CVS: well perfused., regular rate  RESP: speaking in full sentences without pause  GU: deferred, patient performed self swab    UC Treatments / Results  Labs (all labs ordered are listed, but only abnormal results are displayed)  Labs Reviewed  WET PREP, GENITAL - Abnormal; Notable for the following components:      Result Value   Yeast Wet Prep HPF POC PRESENT (*)    Clue Cells Wet  Prep HPF POC PRESENT (*)    WBC, Wet Prep HPF POC >10 (*)    All other components within normal limits  URINALYSIS, W/ REFLEX TO CULTURE (INFECTION SUSPECTED) - Abnormal; Notable for the following components:   APPearance HAZY (*)    Hgb urine dipstick TRACE (*)    Bacteria, UA MANY (*)    All other components within normal limits  PREGNANCY, URINE    EKG   Radiology No results found.  Procedures Procedures (including critical care time)  Medications Ordered in UC Medications - No data to display  Initial Impression / Assessment and Plan / UC Course  I have reviewed the triage vital signs and the nursing notes.  Pertinent labs & imaging results that were available during my care of the patient were reviewed by me and considered in my medical decision making (see chart for details).      Patient is a 32 y.o.Marland Kitchen female  who presents for vaginal discharge and ordo.  Overall patient is well-appearing and afebrile.  Vital signs stable.  UA not consistent with acute cystitis. Yeast vaginitis and bacterial vaginitis confirmed on wet prep.  Gonorrhea and Chlamydia testing declined. - Treatment: Flagyl 500 BID x 7 days and advised patient to not drink alcohol while taking this medication. Diflucan for 2 doses for yeast infection     Return precautions including abdominal pain, fever, chills, nausea, or vomiting given. Discussed MDM, treatment plan and plan for follow-up with patient who agrees with plan.       Final Clinical Impressions(s) / UC Diagnoses   Final diagnoses:  Bacterial vaginosis  Yeast vaginitis     Discharge Instructions      You had evidence of of a bacterial and yeast vaginal infection today.  Stop by the pharmacy to pick up your prescriptions.  You did not have a UTI.   For BV/bacterial vaginosis: Take metronidazole twice a day for the next 7 days.  Do not drink any alcohol with taking this medication.  For yeast infection: Take the first dose of  Diflucan on day 3 and after you complete your antibiotics take the last dose.  If your symptoms do not improve in the next 7 days, be sure to follow-up here or at your primary care provider office.  Go to the emergency department if you are having increasing pain, worsening vaginal bleeding or fever.      ED Prescriptions     Medication Sig Dispense Auth. Provider   metroNIDAZOLE (FLAGYL) 500 MG tablet Take 1 tablet (500 mg total) by mouth 2 (two) times daily. 14 tablet Kanav Kazmierczak, DO   fluconazole (DIFLUCAN) 150 MG tablet Take 1 tablet (150 mg total) by mouth every 3 (three) days for 2 doses. 2 tablet Lyndee Hensen, DO      PDMP not reviewed this encounter.   Lyndee Hensen, DO 01/27/23 2013

## 2023-01-27 NOTE — ED Triage Notes (Signed)
Pt presents with vaginal discharge, odor, bloating and constipation x 3 weeks.

## 2023-01-27 NOTE — Discharge Instructions (Addendum)
You had evidence of of a bacterial and yeast vaginal infection today.  Stop by the pharmacy to pick up your prescriptions.  You did not have a UTI.   For BV/bacterial vaginosis: Take metronidazole twice a day for the next 7 days.  Do not drink any alcohol with taking this medication.  For yeast infection: Take the first dose of Diflucan on day 3 and after you complete your antibiotics take the last dose.  If your symptoms do not improve in the next 7 days, be sure to follow-up here or at your primary care provider office.  Go to the emergency department if you are having increasing pain, worsening vaginal bleeding or fever.

## 2023-02-07 ENCOUNTER — Telehealth: Payer: Self-pay | Admitting: Nurse Practitioner

## 2023-02-07 NOTE — Telephone Encounter (Signed)
Patient notified of message below.

## 2023-02-07 NOTE — Telephone Encounter (Signed)
Prescription Request  02/07/2023  LOV: 08/26/2022  What is the name of the medication or equipment? clonazePAM (KLONOPIN) 0.5 MG tablet [[id number]]   Have you contacted your pharmacy to request a refill?  Yes, she has moved to [place]. She does have an appt scheduled with Lauren on 02/11/23. She is wanting a 2 month supply.  Which pharmacy would you like this sent to?  [id number] Dan Humphreys, [place] - [address] [place] [address] AT [facility] OF [address] [place] [address] Phone: [phone] Fax: [phone]    Patient notified that their request is being sent to the clinical staff for review and that they should receive a response within 2 business days.   Please advise at [facility] [phone]

## 2023-02-11 ENCOUNTER — Ambulatory Visit: Payer: Medicaid Other | Admitting: Nurse Practitioner

## 2023-02-11 ENCOUNTER — Telehealth: Payer: Self-pay | Admitting: Nurse Practitioner

## 2023-02-11 ENCOUNTER — Other Ambulatory Visit: Payer: Medicaid Other

## 2023-02-11 ENCOUNTER — Ambulatory Visit (INDEPENDENT_AMBULATORY_CARE_PROVIDER_SITE_OTHER)
Admission: RE | Admit: 2023-02-11 | Discharge: 2023-02-11 | Disposition: A | Discharge status: Home / Self Care | Payer: Medicaid Other | Source: Ambulatory Visit | Attending: Nurse Practitioner | Admitting: Nurse Practitioner

## 2023-02-11 ENCOUNTER — Encounter: Payer: Self-pay | Admitting: Nurse Practitioner

## 2023-02-11 VITALS — BP 102/76 | HR 100 | Temp 98.2°F | Ht 60.0 in | Wt 126.2 lb

## 2023-02-11 DIAGNOSIS — N926 Irregular menstruation, unspecified: Secondary | ICD-10-CM

## 2023-02-11 DIAGNOSIS — R6889 Other general symptoms and signs: Secondary | ICD-10-CM

## 2023-02-11 DIAGNOSIS — F319 Bipolar disorder, unspecified: Secondary | ICD-10-CM | POA: Diagnosis not present

## 2023-02-11 DIAGNOSIS — R1032 Left lower quadrant pain: Secondary | ICD-10-CM | POA: Diagnosis not present

## 2023-02-11 DIAGNOSIS — G8929 Other chronic pain: Secondary | ICD-10-CM

## 2023-02-11 DIAGNOSIS — F411 Generalized anxiety disorder: Secondary | ICD-10-CM

## 2023-02-11 DIAGNOSIS — M546 Pain in thoracic spine: Secondary | ICD-10-CM | POA: Diagnosis not present

## 2023-02-11 DIAGNOSIS — G4700 Insomnia, unspecified: Secondary | ICD-10-CM | POA: Diagnosis not present

## 2023-02-11 LAB — VITAMIN D 25 HYDROXY (VIT D DEFICIENCY, FRACTURES): VITD: 16.99 ng/mL — ABNORMAL LOW (ref 30.00–100.00)

## 2023-02-11 LAB — POCT URINALYSIS DIPSTICK
Bilirubin, UA: NEGATIVE
Glucose, UA: NEGATIVE
Ketones, UA: NEGATIVE
Leukocytes, UA: NEGATIVE
Nitrite, UA: NEGATIVE
Protein, UA: NEGATIVE
Spec Grav, UA: 1.01 (ref 1.010–1.025)
Urobilinogen, UA: 0.2 E.U./dL
pH, UA: 7 (ref 5.0–8.0)

## 2023-02-11 LAB — COMPREHENSIVE METABOLIC PANEL
ALT: 12 U/L (ref 0–35)
AST: 14 U/L (ref 0–37)
Albumin: 4.4 g/dL (ref 3.5–5.2)
Alkaline Phosphatase: 39 U/L (ref 39–117)
BUN: 8 mg/dL (ref 6–23)
CO2: 27 mEq/L (ref 19–32)
Calcium: 9 mg/dL (ref 8.4–10.5)
Chloride: 105 mEq/L (ref 96–112)
Creatinine, Ser: 0.58 mg/dL (ref 0.40–1.20)
GFR: 120.46 mL/min (ref >60.00)
Glucose, Bld: 86 mg/dL (ref 70–99)
Potassium: 4.3 mEq/L (ref 3.5–5.1)
Sodium: 139 mEq/L (ref 135–145)
Total Bilirubin: 0.2 mg/dL (ref 0.2–1.2)
Total Protein: 7 g/dL (ref 6.0–8.3)

## 2023-02-11 LAB — CBC
HCT: 42.6 % (ref 36.0–46.0)
Hemoglobin: 14.4 g/dL (ref 12.0–15.0)
MCHC: 33.8 g/dL (ref 30.0–36.0)
MCV: 99.7 fl (ref 78.0–100.0)
Platelets: 338 10*3/uL (ref 150.0–400.0)
RBC: 4.27 Mil/uL (ref 3.87–5.11)
RDW: 13.7 % (ref 11.5–15.5)
WBC: 7.4 10*3/uL (ref 4.0–10.5)

## 2023-02-11 LAB — VITAMIN B12: Vitamin B-12: 269 pg/mL (ref 211–911)

## 2023-02-11 LAB — TSH: TSH: 0.6 u[IU]/mL (ref 0.35–5.50)

## 2023-02-11 MED ORDER — VITAMIN D (ERGOCALCIFEROL) 1.25 MG (50000 UNIT) PO CAPS
50000.0000 [IU] | ORAL_CAPSULE | ORAL | 0 refills | Status: DC
Start: 2023-02-11 — End: 2023-11-18

## 2023-02-11 MED ORDER — IBUPROFEN 600 MG PO TABS: 600.0000 mg | ORAL_TABLET | Freq: Three times a day (TID) | ORAL | 1 refills | Status: DC | PRN

## 2023-02-11 MED ORDER — CLONAZEPAM 0.5 MG PO TABS: 0.5000 mg | ORAL_TABLET | Freq: Two times a day (BID) | ORAL | 0 refills | Status: DC | PRN

## 2023-02-11 MED ORDER — ZOLPIDEM TARTRATE 5 MG PO TABS: 5.0000 mg | ORAL_TABLET | Freq: Every evening | ORAL | 0 refills | Status: DC | PRN

## 2023-02-11 NOTE — Telephone Encounter (Signed)
Pt called and said they done an x-ray on lower back and in order for them to do an upper back x-ray they need orders and the patient would like to get everything done today and she is still at the facility please call her back

## 2023-02-11 NOTE — Progress Notes (Signed)
Established Patient Office Visit  Subjective   Patient ID: Kristie Thornton, female    DOB: 04/18/1991  Age: 32 y.o. MRN: [id number]  Chief Complaint  Patient presents with   Medication Management    With refills, concerns with Insomnia    HPI  Kristie Thornton is here to follow-up on bipolar, anxiety, and insomnia. She also notes left lower quadrant pain that started last night.   She states that she has been having left lower quadrant pain that started last night. She does have a history of chronic constipation and states that recently it has been fluctuating between constipation and diarrhea. She also notes that she has a history of ovarian cysts. She was supposed to have a colonoscopy with GI, however she never scheduled this. She also notes that her menses started today and is heavy. She has been experiencing sensitivity to cold and is needing to wear jackets and a blanket.   She states that her insomnia is not controlled. She was taking trazodone however it did not help her sleep and gave her a dry mouth. She was prescribed 1/2 tablet to 1 tablet, however was taking up to 3 tablets (150mg ) without falling asleep. She has also tried seroquel in the past.   She states that she was seeing psychiatry, however did not get along with that psychiatrist and stopped going. She is still taking clonazepam 0.5mg  twice a day as needed for anxiety. She states that her mood is not currently controlled and she still has a lot of stress. She would like a refill on her clonazepam.   She is also still having ongoing back pain. She states that she is not able to do pedicures right now from the pain. She was taking more of her prescribed gabapentin doses and then got concerned and stopped taking it completely. She then states that the pain got really worse and felt like she was going through withdrawal.      02/11/2023   11:17 AM 05/01/2022    2:27 PM 01/15/2022   11:12 AM 12/10/2021    3:22 PM 11/20/2021    9:12 AM   Depression screen PHQ 2/9  Decreased Interest 2 0 0 0 0  Down, Depressed, Hopeless 0 1  1 1   PHQ - 2 Score 2 1 0 1 1  Altered sleeping 3 1 3 2 2   Tired, decreased energy 3 1 2 3 1   Change in appetite 3 3 3 3 1   Feeling bad or failure about yourself  3 2 0 1 0  Trouble concentrating 0 1 1 2  0  Moving slowly or fidgety/restless 3 1 2 2  0  Suicidal thoughts 0 0 0 0 0  PHQ-9 Score 17 10 11 14 5   Difficult doing work/chores Extremely dIfficult Very difficult Extremely dIfficult Very difficult Somewhat difficult      02/11/2023   11:17 AM 05/01/2022    2:28 PM 01/15/2022   11:13 AM 12/10/2021    3:23 PM  GAD 7 : Generalized Anxiety Score  Nervous, Anxious, on Edge 3 3 3 1   Control/stop worrying 3 3 2  0  Worry too much - different things 3 2 2 1   Trouble relaxing 3 2 3 2   Restless 3 1 3 2   Easily annoyed or irritable 3 1 1 2   Afraid - awful might happen 0 1 1 0  Total GAD 7 Score 18 13 15 8   Anxiety Difficulty Extremely difficult  Extremely difficult Somewhat difficult  ROS See pertinent positives and negatives per HPI.    Objective:     BP 102/76 (BP Location: Left Arm)   Pulse 100   Temp 98.2 F (36.8 C)   Ht 5' (1.524 m)   Wt 126 lb 3.2 oz (57.2 kg)   LMP 02/11/2023 (Exact Date)   SpO2 98%   BMI 24.65 kg/m    Physical Exam Vitals and nursing note reviewed.  Constitutional:      General: She is not in acute distress.    Appearance: Normal appearance.  HENT:     Head: Normocephalic.  Eyes:     Conjunctiva/sclera: Conjunctivae normal.  Cardiovascular:     Rate and Rhythm: Normal rate and regular rhythm.     Pulses: Normal pulses.     Heart sounds: Normal heart sounds.  Pulmonary:     Effort: Pulmonary effort is normal.     Breath sounds: Normal breath sounds.  Abdominal:     Tenderness: There is abdominal tenderness in the left lower quadrant. There is no guarding or rebound. Negative signs include Murphy's sign, Rovsing's sign and McBurney's sign.   Musculoskeletal:     Cervical back: Normal range of motion.  Skin:    General: Skin is warm.  Neurological:     General: No focal deficit present.     Mental Status: She is alert and oriented to person, place, and time.  Psychiatric:        Mood and Affect: Mood normal.        Behavior: Behavior normal.        Thought Content: Thought content normal.        Judgment: Judgment normal.      Results for orders placed or performed in visit on 02/11/23  CBC  Result Value Ref Range   WBC 7.4 4.0 - 10.5 K/uL   RBC 4.27 3.87 - 5.11 Mil/uL   Platelets 338.0 150.0 - 400.0 K/uL   Hemoglobin 14.4 12.0 - 15.0 g/dL   HCT 40.9 81.1 - 91.4 %   MCV 99.7 78.0 - 100.0 fl   MCHC 33.8 30.0 - 36.0 g/dL   RDW 78.2 95.6 - 21.3 %  Comprehensive metabolic panel  Result Value Ref Range   Sodium 139 135 - 145 mEq/L   Potassium 4.3 3.5 - 5.1 mEq/L   Chloride 105 96 - 112 mEq/L   CO2 27 19 - 32 mEq/L   Glucose, Bld 86 70 - 99 mg/dL   BUN 8 6 - 23 mg/dL   Creatinine, Ser 0.86 0.40 - 1.20 mg/dL   Total Bilirubin 0.2 0.2 - 1.2 mg/dL   Alkaline Phosphatase 39 39 - 117 U/L   AST 14 0 - 37 U/L   ALT 12 0 - 35 U/L   Total Protein 7.0 6.0 - 8.3 g/dL   Albumin 4.4 3.5 - 5.2 g/dL   GFR 578.46 >96.29 mL/min   Calcium 9.0 8.4 - 10.5 mg/dL  TSH  Result Value Ref Range   TSH 0.60 0.35 - 5.50 uIU/mL  Vitamin B12  Result Value Ref Range   Vitamin B-12 269 211 - 911 pg/mL  VITAMIN D 25 Hydroxy (Vit-D Deficiency, Fractures)  Result Value Ref Range   VITD 16.99 (L) 30.00 - 100.00 ng/mL  POCT urinalysis dipstick  Result Value Ref Range   Color, UA     Clarity, UA     Glucose, UA Negative Negative   Bilirubin, UA Negative    Ketones, UA Negative  Spec Grav, UA 1.010 1.010 - 1.025   Blood, UA 3+    pH, UA 7.0 5.0 - 8.0   Protein, UA Negative Negative   Urobilinogen, UA 0.2 0.2 or 1.0 E.U./dL   Nitrite, UA Negative    Leukocytes, UA Negative Negative   Appearance     Odor       Assessment &  Plan:   Problem List Items Addressed This Visit       Other   Generalized anxiety disorder    Chronic, not controlled. She has taken sertraline, paxil, and venlafaxine in the past. She is currently taking clonazepam 0.5mg  BID prn. PDMP reviewed. Refill sent to the pharmacy and referral placed to psychiatry.       Chronic midline thoracic back pain    Chronic, not controlled. She was prescribed gabapentin  TID, however pain was not controlled and she self increased this and was taking 4,200mg  per day total. Again discussed not taking medication more than prescribed without talking to me first. Will check an x-ray of her thoracic spine and place a referral to orthopedics. She can take gabapentin 1,200mg  TID. Check CMP, CBC, vitamin D and B12 today. Follow-up in 4 weeks.       Relevant Medications   clonazePAM (KLONOPIN) 0.5 MG tablet   ibuprofen (ADVIL) 600 MG tablet   Other Relevant Orders   DG Thoracic Spine W/Swimmers   Ambulatory referral to Orthopedic Surgery   Bipolar 1 disorder    Chronic, not controlled. She is not taking seroquel anymore and states that she did not get along with the last psychiatrist. Will place referral to different psychiatrist. Recommend that they help treat bipolar, anxiety, and insomnia.       Relevant Orders   Ambulatory referral to Psychiatry   LLQ abdominal pain - Primary    Pain started last night. She has had LLQ pain in the past associated with IBS and ovarian cysts. She does have LLQ tenderness today. U/A negative. She declines STD testing today. Encouraged her to follow-up with GI and will place a referral to GYN. Make sure she is drinking plenty of fluids. Check CMP, CBC today.       Relevant Orders   POCT urinalysis dipstick (Completed)   CBC (Completed)   Comprehensive metabolic panel (Completed)   Ambulatory referral to Gynecology   Insomnia    Chronic, not controlled. We have discussed sleep hygiene in the past. She was given  trazodone to take 25-50mg  at bedtime, however it was not helping and she self increased to 100-150mg  at bedtime. This was still not helping and caused dry mouth. Discussed that she should not increase medications without first discussing with me or prescribing provider. Since this is not helping, will start ambien  as needed at bedtime. Discussed possible side effects. PDMP reviewed. Follow-up in 4 weeks.       Other Visit Diagnoses     Sensitivity to the cold       Relevant Orders   CBC (Completed)   TSH (Completed)   Iron, TIBC and Ferritin Panel   Vitamin B12 (Completed)   VITAMIN D 25 Hydroxy (Vit-D Deficiency, Fractures) (Completed)   Irregular periods       Relevant Orders   Ambulatory referral to Gynecology       Return in about 4 weeks (around 03/11/2023) for insomnia.    Gerre Scull, NP

## 2023-02-11 NOTE — Assessment & Plan Note (Signed)
Chronic, not controlled. We have discussed sleep hygiene in the past. She was given trazodone to take 25-50mg  at bedtime, however it was not helping and she self increased to 100-150mg  at bedtime. This was still not helping and caused dry mouth. Discussed that she should not increase medications without first discussing with me or prescribing provider. Since this is not helping, will start ambien  as needed at bedtime. Discussed possible side effects. PDMP reviewed. Follow-up in 4 weeks.

## 2023-02-11 NOTE — Patient Instructions (Signed)
It was great to see you!  Keep taking your gabapentin 2 tablets 3 times a day  I have placed a referral to orthopedics  I have an xray of your back  I have placed a referral to GYN and orthopedics, they will call to schedule   Keep drinking plenty of water and eating fiber  We are checking your labs today and will let you know the results via mychart/phone.   Start ambien 1 tablet at bedtime as needed.   Let's follow-up in 4 weeks, sooner if you have concerns.  If a referral was placed today, you will be contacted for an appointment. Please note that routine referrals can sometimes take up to 3-4 weeks to process. Please call our office if you haven't heard anything after this time frame.  Take care,  Rodman Pickle, NP

## 2023-02-11 NOTE — Assessment & Plan Note (Signed)
Chronic, not controlled. She is not taking seroquel anymore and states that she did not get along with the last psychiatrist. Will place referral to different psychiatrist. Recommend that they help treat bipolar, anxiety, and insomnia.

## 2023-02-11 NOTE — Telephone Encounter (Signed)
Caller Name: Kechia Call back phone #: [phone]  Reason for Call: Pt called to say that she received a bill from service on 04/04/2022 and has been calling for a year. She was given the number to billing and it was suggested she call her insurance. She said she did. She has been told that it was coded wrong and it needed to be changed here.

## 2023-02-11 NOTE — Assessment & Plan Note (Addendum)
Pain started last night. She has had LLQ pain in the past associated with IBS and ovarian cysts. She does have LLQ tenderness today. U/A negative. She declines STD testing today. Encouraged her to follow-up with GI and will place a referral to GYN. Make sure she is drinking plenty of fluids. Check CMP, CBC today.

## 2023-02-11 NOTE — Telephone Encounter (Signed)
Patient notified of message and will wait for ortho appointment for further x-rays

## 2023-02-11 NOTE — Assessment & Plan Note (Addendum)
Chronic, not controlled. She was prescribed gabapentin  TID, however pain was not controlled and she self increased this and was taking 4,200mg  per day total. Again discussed not taking medication more than prescribed without talking to me first. Will check an x-ray of her thoracic spine and place a referral to orthopedics. She can take gabapentin 1,200mg  TID. Check CMP, CBC, vitamin D and B12 today. Follow-up in 4 weeks.

## 2023-02-11 NOTE — Assessment & Plan Note (Signed)
Chronic, not controlled. She has taken sertraline, paxil, and venlafaxine in the past. She is currently taking clonazepam 0.5mg  BID prn. PDMP reviewed. Refill sent to the pharmacy and referral placed to psychiatry.

## 2023-02-12 LAB — IRON,TIBC AND FERRITIN PANEL
%SAT: 29 % (calc) (ref 16–45)
Ferritin: 101 ng/mL (ref 16–154)
Iron: 77 ug/dL (ref 40–190)
TIBC: 266 mcg/dL (calc) (ref 250–450)

## 2023-02-12 NOTE — Telephone Encounter (Signed)
Returned call to pt about bill. Bill for [phone] received from [employer] for DOS 04/04/2022. Pt states she called [facility] and was told to call the office and we would know what to do. I have sent msg to rep at [facility] to determine why insurance denied payment.

## 2023-02-14 ENCOUNTER — Telehealth: Payer: Self-pay

## 2023-02-14 NOTE — Telephone Encounter (Signed)
Prior Kristie Thornton is needed for Zolpidem  Tablets  Please call [phone]  Patient ID# [id number]

## 2023-02-17 ENCOUNTER — Encounter: Payer: Self-pay | Admitting: Physical Medicine and Rehabilitation

## 2023-02-17 ENCOUNTER — Ambulatory Visit (INDEPENDENT_AMBULATORY_CARE_PROVIDER_SITE_OTHER): Payer: Medicaid Other | Admitting: Physical Medicine and Rehabilitation

## 2023-02-17 VITALS — BP 103/70 | HR 93

## 2023-02-17 DIAGNOSIS — G894 Chronic pain syndrome: Secondary | ICD-10-CM | POA: Diagnosis not present

## 2023-02-17 DIAGNOSIS — M545 Low back pain, unspecified: Secondary | ICD-10-CM

## 2023-02-17 DIAGNOSIS — M546 Pain in thoracic spine: Secondary | ICD-10-CM | POA: Diagnosis not present

## 2023-02-17 DIAGNOSIS — G8929 Other chronic pain: Secondary | ICD-10-CM

## 2023-02-17 MED ORDER — CYCLOBENZAPRINE HCL 10 MG PO TABS
10.0000 mg | ORAL_TABLET | Freq: Every day | ORAL | 0 refills | Status: DC
Start: 2023-02-17 — End: 2023-04-17

## 2023-02-17 NOTE — Progress Notes (Signed)
Functional Pain Scale - descriptive words and definitions  Unmanageable (7)  Pain interferes with normal ADL's/nothing seems to help/sleep is very difficult/active distractions are very difficult to concentrate on. Severe range order  Average Pain 7  Patient states that it's mainly her whole back, but mostly in the middle and goes down. Back pain started after receiving an epidural.  States that she is not able to sleep due to the pain. 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  Hurts to stand at times.  Worse in the mornings.   Used heat, and was taken Ibuprofen and gabapentin
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
## 2023-02-20 NOTE — Telephone Encounter (Signed)
F/u email sent to [facility] rep

## 2023-02-23 ENCOUNTER — Ambulatory Visit
Admission: RE | Admit: 2023-02-23 | Discharge: 2023-02-23 | Disposition: A | Discharge status: Home / Self Care | Payer: Medicaid Other | Source: Ambulatory Visit | Attending: Physical Medicine and Rehabilitation | Admitting: Physical Medicine and Rehabilitation

## 2023-02-23 ENCOUNTER — Encounter: Payer: Self-pay | Admitting: Nurse Practitioner

## 2023-02-23 DIAGNOSIS — M545 Low back pain, unspecified: Secondary | ICD-10-CM | POA: Diagnosis present

## 2023-02-23 DIAGNOSIS — M546 Pain in thoracic spine: Secondary | ICD-10-CM | POA: Diagnosis present

## 2023-02-23 DIAGNOSIS — G8929 Other chronic pain: Secondary | ICD-10-CM | POA: Diagnosis present

## 2023-02-25 NOTE — Telephone Encounter (Signed)
Noted  

## 2023-02-27 ENCOUNTER — Other Ambulatory Visit: Payer: Self-pay | Admitting: Physical Medicine and Rehabilitation

## 2023-02-27 NOTE — Telephone Encounter (Signed)
Medication approved and patient picked up Rx.

## 2023-02-28 ENCOUNTER — Telehealth: Payer: Self-pay

## 2023-02-28 NOTE — Telephone Encounter (Signed)
Spoke with patient and scheduled OV for 03/03/23.  

## 2023-02-28 NOTE — Telephone Encounter (Signed)
-----   Message from Juanda Chance, NP sent at 02/27/2023  9:04 AM EDT ----- Can we have patient come back in for OV to discuss MRI findings and possible lumbar injection? Thanks

## 2023-03-03 ENCOUNTER — Encounter: Payer: Self-pay | Admitting: Physical Medicine and Rehabilitation

## 2023-03-03 ENCOUNTER — Ambulatory Visit (INDEPENDENT_AMBULATORY_CARE_PROVIDER_SITE_OTHER): Payer: Medicaid Other | Admitting: Physical Medicine and Rehabilitation

## 2023-03-03 DIAGNOSIS — G8929 Other chronic pain: Secondary | ICD-10-CM | POA: Diagnosis not present

## 2023-03-03 DIAGNOSIS — M519 Unspecified thoracic, thoracolumbar and lumbosacral intervertebral disc disorder: Secondary | ICD-10-CM | POA: Diagnosis not present

## 2023-03-03 DIAGNOSIS — M5416 Radiculopathy, lumbar region: Secondary | ICD-10-CM

## 2023-03-03 DIAGNOSIS — M5116 Intervertebral disc disorders with radiculopathy, lumbar region: Secondary | ICD-10-CM | POA: Diagnosis not present

## 2023-03-03 DIAGNOSIS — M7918 Myalgia, other site: Secondary | ICD-10-CM

## 2023-03-03 NOTE — Progress Notes (Unsigned)
Functional Pain Scale - descriptive words and definitions  Distracting (5)    Aware of pain/able to complete some ADL's but limited by pain/sleep is affected and active distractions are only slightly useful. Moderate range order  Average Pain 5  Lower back pain. MRI review 
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
## 2023-03-06 ENCOUNTER — Ambulatory Visit (INDEPENDENT_AMBULATORY_CARE_PROVIDER_SITE_OTHER): Payer: Medicaid Other | Admitting: Advanced Practice Midwife

## 2023-03-06 ENCOUNTER — Encounter: Payer: Self-pay | Admitting: Advanced Practice Midwife

## 2023-03-06 VITALS — BP 114/74 | HR 106 | Ht 60.0 in | Wt 125.0 lb

## 2023-03-06 DIAGNOSIS — R6889 Other general symptoms and signs: Secondary | ICD-10-CM

## 2023-03-06 DIAGNOSIS — N926 Irregular menstruation, unspecified: Secondary | ICD-10-CM

## 2023-03-06 DIAGNOSIS — N921 Excessive and frequent menstruation with irregular cycle: Secondary | ICD-10-CM

## 2023-03-06 DIAGNOSIS — N946 Dysmenorrhea, unspecified: Secondary | ICD-10-CM

## 2023-03-07 ENCOUNTER — Telehealth: Payer: Self-pay

## 2023-03-07 ENCOUNTER — Other Ambulatory Visit: Payer: Self-pay | Admitting: Physical Medicine and Rehabilitation

## 2023-03-07 LAB — CBC
Hematocrit: 36.7 % (ref 34.0–46.6)
Hemoglobin: 12.5 g/dL (ref 11.1–15.9)
MCH: 33.3 pg — ABNORMAL HIGH (ref 26.6–33.0)
MCHC: 34.1 g/dL (ref 31.5–35.7)
MCV: 98 fL — ABNORMAL HIGH (ref 79–97)
Platelets: 275 10*3/uL (ref 150–450)
RBC: 3.75 x10E6/uL — ABNORMAL LOW (ref 3.77–5.28)
RDW: 11.9 % (ref 11.7–15.4)
WBC: 7.8 10*3/uL (ref 3.4–10.8)

## 2023-03-07 LAB — ESTRADIOL: Estradiol: 57.9 pg/mL

## 2023-03-07 LAB — FSH/LH
FSH: 4.9 m[IU]/mL
LH: 9.9 m[IU]/mL

## 2023-03-07 LAB — PROGESTERONE: Progesterone: 0.1 ng/mL

## 2023-03-07 NOTE — Telephone Encounter (Signed)
Spoke with patient and informed her to take Klonopin 1 hour before procedure

## 2023-03-07 NOTE — Telephone Encounter (Signed)
Patient scheduled for injection 03/17/23. Needs pre procedure Valium sent to [facility]
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
## 2023-03-10 ENCOUNTER — Encounter: Payer: Self-pay | Admitting: Nurse Practitioner

## 2023-03-10 ENCOUNTER — Ambulatory Visit (INDEPENDENT_AMBULATORY_CARE_PROVIDER_SITE_OTHER): Payer: Medicaid Other | Admitting: Nurse Practitioner

## 2023-03-10 VITALS — BP 100/70 | HR 139 | Temp 97.6°F | Ht 60.0 in | Wt 125.2 lb

## 2023-03-10 DIAGNOSIS — F411 Generalized anxiety disorder: Secondary | ICD-10-CM

## 2023-03-10 DIAGNOSIS — G4700 Insomnia, unspecified: Secondary | ICD-10-CM | POA: Diagnosis not present

## 2023-03-10 MED ORDER — CLONAZEPAM 0.5 MG PO TABS: 0.5000 mg | ORAL_TABLET | Freq: Two times a day (BID) | ORAL | 0 refills | Status: DC | PRN

## 2023-03-10 NOTE — Assessment & Plan Note (Signed)
Chronic, ongoing.  She has been taking Klonopin 0.5 mg twice a day as needed.  PDMP reviewed.  Refill sent to the pharmacy.  She has been trying to get an appointment scheduled with psychiatry.  Recommend that she call them back as soon as possible to get this scheduled.  Follow-up in 3 months or sooner with concerns.

## 2023-03-10 NOTE — Progress Notes (Signed)
   Established Patient Office Visit  Subjective   Patient ID: Kristie Thornton, female    DOB: 04/07/1991  Age: 32 y.o. MRN: [id number]  Chief Complaint  Patient presents with   Insomnia    Follow up, Rx refill    HPI  Kristie Thornton is here to follow-up on insomnia.   She states that she is sleeping better at night.  She has been taking the Ambien about every other night and is not having any side effects.  She is still taking the Klonopin twice a day as needed for anxiety.  She states that she is currently playing phone tag with a psychiatrist to schedule an appointment.  She denies SI/HI.    ROS See pertinent positives and negatives per HPI.    Objective:     BP 100/70 (BP Location: Left Arm)   Pulse (!) 139   Temp 97.6 F (36.4 C)   Ht 5' (1.524 m)   Wt 125 lb 3.2 oz (56.8 kg)   LMP 02/11/2023 (Exact Date)   SpO2 95%   BMI 24.45 kg/m    Physical Exam Vitals and nursing note reviewed.  Constitutional:      General: She is not in acute distress.    Appearance: Normal appearance.  HENT:     Head: Normocephalic.  Eyes:     Conjunctiva/sclera: Conjunctivae normal.  Cardiovascular:     Rate and Rhythm: Normal rate and regular rhythm.     Pulses: Normal pulses.     Heart sounds: Normal heart sounds.  Pulmonary:     Effort: Pulmonary effort is normal.     Breath sounds: Normal breath sounds.  Musculoskeletal:     Cervical back: Normal range of motion.  Skin:    General: Skin is warm.  Neurological:     General: No focal deficit present.     Mental Status: She is alert and oriented to person, place, and time.  Psychiatric:        Mood and Affect: Mood normal.        Behavior: Behavior normal.        Thought Content: Thought content normal.        Judgment: Judgment normal.      Assessment & Plan:   Problem List Items Addressed This Visit       Other   Generalized anxiety disorder    Chronic, ongoing.  She has been taking Klonopin 0.5 mg twice a day as needed.   PDMP reviewed.  Refill sent to the pharmacy.  She has been trying to get an appointment scheduled with psychiatry.  Recommend that she call them back as soon as possible to get this scheduled.  Follow-up in 3 months or sooner with concerns.      Insomnia - Primary    Chronic, stable.  Continue Ambien 5 mg at bedtime as needed.  She is not having any side effects of this medication.       Return in about 3 months (around 06/10/2023) for Anxiety, insomnia.    Gerre Scull, NP

## 2023-03-10 NOTE — Patient Instructions (Signed)
It was great to see you!  I have sent a refill to your pharmacy.   Let's follow-up in 3 months, sooner if you have concerns.  If a referral was placed today, you will be contacted for an appointment. Please note that routine referrals can sometimes take up to 3-4 weeks to process. Please call our office if you haven't heard anything after this time frame.  Take care,  Rodman Pickle, NP

## 2023-03-10 NOTE — Assessment & Plan Note (Signed)
Chronic, stable.  Continue Ambien 5 mg at bedtime as needed.  She is not having any side effects of this medication.

## 2023-03-17 ENCOUNTER — Encounter: Payer: Medicaid Other | Admitting: Physical Medicine and Rehabilitation

## 2023-03-18 NOTE — Telephone Encounter (Signed)
Sent another email to [facility]

## 2023-03-20 ENCOUNTER — Ambulatory Visit (INDEPENDENT_AMBULATORY_CARE_PROVIDER_SITE_OTHER): Payer: Medicaid Other

## 2023-03-20 DIAGNOSIS — N926 Irregular menstruation, unspecified: Secondary | ICD-10-CM

## 2023-03-20 DIAGNOSIS — N921 Excessive and frequent menstruation with irregular cycle: Secondary | ICD-10-CM

## 2023-03-20 DIAGNOSIS — N939 Abnormal uterine and vaginal bleeding, unspecified: Secondary | ICD-10-CM | POA: Diagnosis not present

## 2023-03-20 DIAGNOSIS — N946 Dysmenorrhea, unspecified: Secondary | ICD-10-CM

## 2023-04-17 ENCOUNTER — Other Ambulatory Visit: Payer: Self-pay | Admitting: Physical Medicine and Rehabilitation

## 2023-04-17 ENCOUNTER — Encounter: Payer: Self-pay | Admitting: Nurse Practitioner

## 2023-04-17 ENCOUNTER — Other Ambulatory Visit: Payer: Self-pay | Admitting: Nurse Practitioner

## 2023-04-17 MED ORDER — CYCLOBENZAPRINE HCL 10 MG PO TABS: 10.0000 mg | ORAL_TABLET | Freq: Every day | ORAL | 0 refills | Status: DC

## 2023-04-18 MED ORDER — ZOLPIDEM TARTRATE 5 MG PO TABS: 5.0000 mg | ORAL_TABLET | Freq: Every evening | ORAL | 0 refills | Status: DC | PRN

## 2023-04-18 MED ORDER — CLONAZEPAM 0.5 MG PO TABS: 0.5000 mg | ORAL_TABLET | Freq: Two times a day (BID) | ORAL | 0 refills | Status: DC | PRN

## 2023-05-05 ENCOUNTER — Encounter: Payer: Self-pay | Admitting: Nurse Practitioner

## 2023-05-05 ENCOUNTER — Ambulatory Visit (INDEPENDENT_AMBULATORY_CARE_PROVIDER_SITE_OTHER): Payer: MEDICAID | Admitting: Nurse Practitioner

## 2023-05-05 VITALS — BP 102/62 | HR 100 | Temp 98.3°F | Ht 60.0 in | Wt 118.2 lb

## 2023-05-05 DIAGNOSIS — M6740 Ganglion, unspecified site: Secondary | ICD-10-CM | POA: Diagnosis not present

## 2023-05-05 MED ORDER — CLONAZEPAM 0.5 MG PO TABS: 0.5000 mg | ORAL_TABLET | Freq: Two times a day (BID) | ORAL | 0 refills | Status: DC | PRN

## 2023-05-05 MED ORDER — ZOLPIDEM TARTRATE 5 MG PO TABS: 5.0000 mg | ORAL_TABLET | Freq: Every evening | ORAL | 0 refills | Status: DC | PRN

## 2023-05-05 MED ORDER — MELOXICAM 15 MG PO TABS
15.0000 mg | ORAL_TABLET | Freq: Every day | ORAL | 0 refills | Status: DC
Start: 2023-05-05 — End: 2023-11-18

## 2023-05-05 NOTE — Progress Notes (Signed)
   Acute Office Visit  Subjective:     Patient ID: Kristie Thornton, female    DOB: 1991/03/11, 32 y.o.   MRN: [id number]  Chief Complaint  Patient presents with   Lump on Wrist    Lump on right wrist with pain that radiates to forearm     HPI Patient is in today for lump on her right wrist that is painful that radiates to her right forearm. She states that she noticed the bump a few months ago, however it worsened over the last 2 weeks. It is also causing her to have numbness in her hand. She has been taking ibuprofen and tylenol without relief. The pain worsens when she touches it or moves her hand.   ROS See pertinent positives and negatives per HPI.     Objective:    BP 102/62 (BP Location: Left Arm)   Pulse 100   Temp 98.3 F (36.8 C)   Ht 5' (1.524 m)   Wt 118 lb 3.2 oz (53.6 kg)   SpO2 96%   BMI 23.08 kg/m    Physical Exam Vitals and nursing note reviewed.  Constitutional:      General: She is not in acute distress.    Appearance: Normal appearance.  HENT:     Head: Normocephalic.  Eyes:     Conjunctiva/sclera: Conjunctivae normal.  Pulmonary:     Effort: Pulmonary effort is normal.  Musculoskeletal:        General: Tenderness present.     Cervical back: Normal range of motion.  Skin:    General: Skin is warm.     Comments: Cyst to right anterior wrist   Neurological:     General: No focal deficit present.     Mental Status: She is alert and oriented to person, place, and time.  Psychiatric:        Mood and Affect: Mood normal.        Behavior: Behavior normal.        Thought Content: Thought content normal.        Judgment: Judgment normal.      Assessment & Plan:   Problem List Items Addressed This Visit   None Visit Diagnoses     Ganglion cyst    -  Primary   Referral placed to ortho, also gave [facility] info on [facility].Start meloxicam 15mg  daily prn. Do not take with ibuprofen. Can use ice/heat as well   Relevant Orders    Ambulatory referral to Orthopedic Surgery       Meds ordered this encounter  Medications   zolpidem (AMBIEN) 5 MG tablet    Sig: Take 1 tablet (5 mg total) by mouth at bedtime as needed for sleep.    Dispense:  30 tablet    Refill:  0   clonazePAM (KLONOPIN) 0.5 MG tablet    Sig: Take 1 tablet (0.5 mg total) by mouth 2 (two) times daily as needed for anxiety.    Dispense:  60 tablet    Refill:  0   meloxicam (MOBIC) 15 MG tablet    Sig: Take 1 tablet (15 mg total) by mouth daily. Do not take with ibuprofen    Dispense:  30 tablet    Refill:  0    Return if symptoms worsen or fail to improve.  Gerre Scull, NP

## 2023-05-05 NOTE — Patient Instructions (Signed)
It was great to see you!  I have placed a referral to orthopedics, they will call. If they don't have an appointment in the next few days, [facility].    [address] [place], [place] [address].  Start meloxicam once a day for the pain, do not take ibuprofen with this.   Let's follow-up if your symptoms worsen or don't improve  Take care,  Rodman Pickle, NP

## 2023-06-09 ENCOUNTER — Other Ambulatory Visit (HOSPITAL_COMMUNITY)
Admission: RE | Admit: 2023-06-09 | Discharge: 2023-06-09 | Disposition: A | Discharge status: Home / Self Care | Payer: MEDICAID | Source: Ambulatory Visit | Attending: Nurse Practitioner | Admitting: Nurse Practitioner

## 2023-06-09 ENCOUNTER — Ambulatory Visit (INDEPENDENT_AMBULATORY_CARE_PROVIDER_SITE_OTHER): Payer: MEDICAID | Admitting: Nurse Practitioner

## 2023-06-09 ENCOUNTER — Encounter: Payer: Self-pay | Admitting: Nurse Practitioner

## 2023-06-09 VITALS — BP 110/80 | HR 113 | Temp 97.4°F | Ht 60.0 in | Wt 121.6 lb

## 2023-06-09 DIAGNOSIS — T7840XD Allergy, unspecified, subsequent encounter: Secondary | ICD-10-CM | POA: Diagnosis not present

## 2023-06-09 DIAGNOSIS — J40 Bronchitis, not specified as acute or chronic: Secondary | ICD-10-CM

## 2023-06-09 DIAGNOSIS — F411 Generalized anxiety disorder: Secondary | ICD-10-CM | POA: Diagnosis not present

## 2023-06-09 DIAGNOSIS — N9089 Other specified noninflammatory disorders of vulva and perineum: Secondary | ICD-10-CM | POA: Diagnosis present

## 2023-06-09 DIAGNOSIS — G4700 Insomnia, unspecified: Secondary | ICD-10-CM

## 2023-06-09 DIAGNOSIS — H00011 Hordeolum externum right upper eyelid: Secondary | ICD-10-CM

## 2023-06-09 MED ORDER — CLONAZEPAM 0.5 MG PO TABS: 0.5000 mg | ORAL_TABLET | Freq: Two times a day (BID) | ORAL | 0 refills | Status: DC | PRN

## 2023-06-09 MED ORDER — HYDROCOD POLI-CHLORPHE POLI ER 10-8 MG/5ML PO SUER: 5.0000 mL | Freq: Every evening | ORAL | 0 refills | Status: DC | PRN

## 2023-06-09 MED ORDER — PROMETHAZINE-DM 6.25-15 MG/5ML PO SYRP: 5.0000 mL | ORAL_SOLUTION | Freq: Four times a day (QID) | ORAL | 0 refills | Status: DC | PRN

## 2023-06-09 NOTE — Assessment & Plan Note (Signed)
Chronic, ongoing.  She has been taking Klonopin 0.5 mg twice a day as needed.  PDMP reviewed.  Refill sent to the pharmacy.  She would like her psychiatry referral moved to [facility] area since that is where she is living now.  New referral placed.  Follow-up in 3 months.

## 2023-06-09 NOTE — Progress Notes (Signed)
Established Patient Office Visit  Subjective   Patient ID: Kristie Thornton, female    DOB: [birth year]  Age: 32 y.o. MRN: [id number]  Chief Complaint  Patient presents with   Medication Management    Rx refill, check thinks she has tear out from vaginal area, rash on both arms and on left foot    HPI  Kristie Thornton is here to follow-up on anxiety and insomnia. She also thinks she may have a tear in her vaginal area.   She states that she has not heard from the psychiatry office yet, however she would like to see one in the [place] area closer to where she lives.  She states that her anxiety is doing well.  She has however still been having some trouble sleeping.  She states that Ambien caused her to sleepwalk and so she stopped taking this.  She has been taking her Klonopin at bedtime to help with her anxiety and to sleep along with melatonin and magnesium.  She also notes that she is having some pain and a possible tear in her perineal area.  She states that this happens every time she has intercourse, with or without lubricant.  She denies bleeding, however is having some pain and itching.  She has not used anything on this to treated at home.  She is concerned for HSV.  She went to the ER on 05/29/2023 for an allergic reaction and bronchitis.  She was given a Z-Pak and prednisone.  She states that she is still having a cough, that is worse at night.  She has not been taking anything over-the-counter for this.  She states that the hives have gone away.  She is interested in seeing an allergist.  She states that she had not changed any food, soap, laundry detergent, however she was out of town before this happened.  She denies shortness of breath, but does have some chest pain with coughing.  She also notes frequent eye irritation and feels like she gets dust in her eyes frequently.  She also states that she has frequent styes.  She is interested in a different eyedrop than 1 that she is taking  over-the-counter that is not helping with her symptoms.     06/09/2023    2:11 PM 05/05/2023    4:04 PM 02/11/2023   11:17 AM 05/01/2022    2:27 PM 01/15/2022   11:12 AM  Depression screen PHQ 2/9  Decreased Interest 0 0 2 0 0  Down, Depressed, Hopeless 0 0 0 1   PHQ - 2 Score 0 0 2 1 0  Altered sleeping 1 3 3 1 3   Tired, decreased energy 1 3 3 1 2   Change in appetite 1 3 3 3 3   Feeling bad or failure about yourself  0 0 3 2 0  Trouble concentrating 0 0 0 1 1  Moving slowly or fidgety/restless 1 0 3 1 2   Suicidal thoughts 0 0 0 0 0  PHQ-9 Score 4 9 17 10 11   Difficult doing work/chores Not difficult at all  Extremely dIfficult Very difficult Extremely dIfficult      06/09/2023    2:12 PM 05/05/2023    4:04 PM 02/11/2023   11:17 AM 05/01/2022    2:28 PM  GAD 7 : Generalized Anxiety Score  Nervous, Anxious, on Edge 1 2 3 3   Control/stop worrying 1 2 3 3   Worry too much - different things 1 2 3 2   Trouble relaxing 1  2 3 2   Restless 1 2 3 1   Easily annoyed or irritable 1 2 3 1   Afraid - awful might happen 0 0 0 1  Total GAD 7 Score 6 12 18 13   Anxiety Difficulty Not difficult at all Somewhat difficult Extremely difficult      ROS See pertinent positives and negatives per HPI.    Objective:     BP 110/80 (BP Location: Left Arm)   Pulse (!) 113   Temp (!) 97.4 F (36.3 C)   Ht 5' (1.524 m)   Wt 121 lb 9.6 oz (55.2 kg)   LMP 05/17/2023 (Exact Date)   SpO2 98%   Breastfeeding No   BMI 23.75 kg/m    Physical Exam Vitals and nursing note reviewed. Exam conducted with a chaperone present.  Constitutional:      General: She is not in acute distress.    Appearance: Normal appearance.  HENT:     Head: Normocephalic.  Eyes:     Conjunctiva/sclera: Conjunctivae normal.  Cardiovascular:     Rate and Rhythm: Normal rate and regular rhythm.     Pulses: Normal pulses.     Heart sounds: Normal heart sounds.  Pulmonary:     Effort: Pulmonary effort is normal.     Breath  sounds: Normal breath sounds.  Genitourinary:    General: Normal vulva.     Exam position: Lithotomy position.     Labia:        Right: No rash, tenderness or lesion.        Left: No rash, tenderness or lesion.      Comments: Skin lesion blister vs abscess in perineal area Musculoskeletal:     Cervical back: Normal range of motion.  Skin:    General: Skin is warm.  Neurological:     General: No focal deficit present.     Mental Status: She is alert and oriented to person, place, and time.  Psychiatric:        Mood and Affect: Mood normal.        Behavior: Behavior normal.        Thought Content: Thought content normal.        Judgment: Judgment normal.      Assessment & Plan:   Problem List Items Addressed This Visit       Other   Generalized anxiety disorder - Primary    Chronic, ongoing.  She has been taking Klonopin 0.5 mg twice a day as needed.  PDMP reviewed.  Refill sent to the pharmacy.  She would like her psychiatry referral moved to [facility] area since that is where she is living now.  New referral placed.  Follow-up in 3 months.      Relevant Orders   Ambulatory referral to Psychiatry   DRUG MONITORING, PANEL 8 WITH CONFIRMATION, URINE   Hordeolum externum of right upper eyelid    She has been getting frequent styes. She would like an eye drop to help with irritation and frequent styes. Referral placed to ophthalmology       Relevant Orders   Ambulatory referral to Ophthalmology   Insomnia    Chronic, ongoing.  She took the Ambien and states that it made her sleepwalk so she stopped taking this.  She is currently taking her Klonopin at bedtime to help her sleep as needed along with melatonin and magnesium.  We are placing referral to psychiatry for her anxiety and possible bipolar.  She continued this regimen for now.  PDMP reviewed.      Relevant Orders   Ambulatory referral to Psychiatry   Other Visit Diagnoses     Labial lesion       Lesion in her  perineal area. Will check for STDs including HSV, HIV, and RPR. She would also like to be tested for BV and yeast.   Relevant Orders   Cervicovaginal ancillary only   HIV Antibody (routine testing w rflx)   HSV(herpes simplex vrs) 1+2 ab-IgG   RPR   Allergic reaction, subsequent encounter       Allergic reaction has improved. Finished prednisone. She would like a referral to an allergist, referral placed.   Relevant Orders   Ambulatory referral to Allergy   Bronchitis       Discussed that cough may linger for up to 2 months. Start claritin or zyrtec daily. Will give tussionex qHS, however do not take with klonopin. PDMP reviewed.       Return in about 3 months (around 09/09/2023) for anxiety.    Gerre Scull, NP

## 2023-06-09 NOTE — Assessment & Plan Note (Signed)
She has been getting frequent styes. She would like an eye drop to help with irritation and frequent styes. Referral placed to ophthalmology

## 2023-06-09 NOTE — Assessment & Plan Note (Signed)
Chronic, ongoing.  She took the Ambien and states that it made her sleepwalk so she stopped taking this.  She is currently taking her Klonopin at bedtime to help her sleep as needed along with melatonin and magnesium.  We are placing referral to psychiatry for her anxiety and possible bipolar.  She continued this regimen for now.  PDMP reviewed.

## 2023-06-09 NOTE — Patient Instructions (Signed)
It was great to see you!  We are checking your labs today and will let you know the results via mychart/phone.   Start promethazine dm cough syrup every 4 hours as needed  You can try a sitz bath to help with the pain and itching  I have placed a referral to allergy and psychiatry.   Let's follow-up in 3 months, sooner if you have concerns.  If a referral was placed today, you will be contacted for an appointment. Please note that routine referrals can sometimes take up to 3-4 weeks to process. Please call our office if you haven't heard anything after this time frame.  Take care,  Rodman Pickle, NP

## 2023-06-12 ENCOUNTER — Telehealth: Payer: MEDICAID | Admitting: Nurse Practitioner

## 2023-06-12 ENCOUNTER — Encounter: Payer: Self-pay | Admitting: Nurse Practitioner

## 2023-06-12 VITALS — Ht 60.0 in | Wt 121.0 lb

## 2023-06-12 DIAGNOSIS — F1490 Cocaine use, unspecified, uncomplicated: Secondary | ICD-10-CM | POA: Insufficient documentation

## 2023-06-12 DIAGNOSIS — F411 Generalized anxiety disorder: Secondary | ICD-10-CM

## 2023-06-12 NOTE — Assessment & Plan Note (Signed)
Urine drug screen done for Klonopin showed cocaine, THC and no benzodiazepines.  She states that she does not use cocaine and that her marijuana must have been laced with it without her knowing.  Discussed that I recommend that she not use recreational drugs.  Also discussed that she needs to establish with psychiatry as I will no longer prescribe Klonopin.  Keep next scheduled follow-up appointment.

## 2023-06-12 NOTE — Patient Instructions (Signed)
It was great to see you!  Decrease your klonopin to 1 tablet at bedtime for 7 days, then 1 tablet every other day for 7 days, then stop.   I have referred you to psychiatry closer to your house at your last visit, they should call to schedule.   Let's follow-up at your next scheduled appointment.   Take care,  Rodman Pickle, NP

## 2023-06-12 NOTE — Assessment & Plan Note (Signed)
Chronic, ongoing.  She states that she has been taking Klonopin 1 mg at bedtime to help with sleep.  UDS shows no benzodiazepine in her system, but was positive for cocaine and THC.  Discussed that she needs to establish with psychiatry and I will no longer refill her Klonopin.  She should decrease to 1 tablet, 0.5 mg at bedtime for 1 week, then every other day for 1 week then stop.  She is in agreement with this saying she does not like to be dependent on medications.  Follow-up at next scheduled appointment

## 2023-06-12 NOTE — Progress Notes (Signed)
[facility] [address] [place] [address] Dept: [phone] Dept Fax: [phone]  Virtual Video Visit  I connected with [name] on 06/12/23 at  4:00 PM EDT by a video enabled telemedicine application and verified that I am speaking with the correct person using two identifiers.  Location patient: Home Location provider: Clinic Persons participating in the virtual visit: Patient; Rodman Pickle, NP; Malena Peer, CMA  I discussed the limitations of evaluation and management by telemedicine and the availability of in person appointments. The patient expressed understanding and agreed to proceed.  Chief Complaint  Patient presents with   Discuss Labs    Had labs drawn on 06/09/2023    SUBJECTIVE:  HPI: Kristie Thornton is a 32 y.o. female who presents to discuss lab results.  She is currently taking Klonopin and had a urinary drug screen done which was positive for THC and cocaine and negative for benzodiazepines.  She states then that she does not use good pain and believes that her marijuana may have been laced with it.  She states that she got it from someone she does normally not get it from.  She denies chest pain and shortness of breath.  Patient Active Problem List   Diagnosis Date Noted   Cocaine use 06/12/2023   Insomnia 08/26/2022   Amenorrhea 08/26/2022   Change in bowel habits 05/03/2022   BRBPR (bright red blood per rectum) 05/03/2022   Chronic idiopathic constipation 05/03/2022   Financial difficulties 05/01/2022   Gross hematuria 04/03/2022   Pyrosis 03/06/2022   Decreased appetite 03/06/2022   Bilious vomiting with nausea 03/06/2022   Rectal bleeding 03/06/2022   Chronic bilateral lower abdominal pain 03/06/2022   Chronic constipation 03/06/2022   LLQ abdominal pain 02/19/2022   Hordeolum externum of right upper eyelid 01/16/2022   Bipolar 1 disorder (HCC) 12/10/2021   Chronic midline  thoracic back pain 11/20/2021   BV (bacterial vaginosis) 11/20/2021   Birth control counseling 11/20/2021   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   Vitamin D deficiency 11/20/2021   Vitamin B12 deficiency 11/20/2021   Vapes nicotine containing substance 11/20/2021   History of narcotic use 09/10/2017   Generalized anxiety disorder 08/08/2014   Endometriosis 08/08/2014    Past Surgical History:  Procedure Laterality Date   NO PAST SURGERIES      Family History  Problem Relation Age of Onset   Cancer Mother        colon cancer   Colon polyps Mother    Stroke Father    Cancer Father 41       testicular cancer   Liver disease Sister    Fibromyalgia Sister    Pancreatic disease Sister    ADD / ADHD Sister    ADD / ADHD Sister    ADD / ADHD Brother    Colon cancer Neg Hx    Esophageal cancer Neg Hx    Inflammatory bowel disease Neg Hx    Pancreatic cancer Neg Hx    Rectal cancer Neg Hx    Stomach cancer Neg Hx     Social History   Tobacco Use   Smoking status: Former    Types: Cigarettes   Smokeless tobacco: Never   Tobacco comments:    stopped 02/2017 previously vaped  Vaping Use   Vaping status: Every Day  Substance Use Topics   Alcohol use: No    Alcohol/week: 0.0 standard drinks of alcohol    Comment: stopped  01/2017 occasional   Drug use: No    Types: Marijuana    Comment: 03/12/17 last week ago, trying to stop     Current Outpatient Medications:    azithromycin (ZITHROMAX) 250 MG tablet, Take 1 tablet by mouth daily., Disp: , Rfl:    chlorpheniramine-HYDROcodone (TUSSIONEX) 10-8 MG/5ML, Take 5 mLs by mouth at bedtime as needed for cough., Disp: 70 mL, Rfl: 0   clonazePAM (KLONOPIN) 0.5 MG tablet, Take 1 tablet (0.5 mg total) by mouth 2 (two) times daily as needed for anxiety., Disp: 60 tablet, Rfl: 0   cyclobenzaprine (FLEXERIL) 10 MG tablet, Take 1 tablet (10 mg total) by mouth at bedtime., Disp: 30 tablet, Rfl: 0   gabapentin (NEURONTIN)  600 MG tablet, Take 1 tablet (600 mg total) by mouth 3 (three) times daily. Take 1 tablet in the morning, 1 tablet in the afternoon, and 1.5 tablets at bedtime, Disp: 270 tablet, Rfl: 1   ibuprofen (ADVIL) 600 MG tablet, Take 1 tablet (600 mg total) by mouth every 8 (eight) hours as needed., Disp: 30 tablet, Rfl: 1   meloxicam (MOBIC) 15 MG tablet, Take 1 tablet (15 mg total) by mouth daily. Do not take with ibuprofen, Disp: 30 tablet, Rfl: 0   predniSONE (DELTASONE) 20 MG tablet, Take 40 mg by mouth daily., Disp: , Rfl:    Vitamin D, Ergocalciferol, (DRISDOL) 1.25 MG (50000 UNIT) CAPS capsule, Take 1 capsule (50,000 Units total) by mouth every 7 (seven) days., Disp: 12 capsule, Rfl: 0   zolpidem (AMBIEN) 5 MG tablet, Take 1 tablet (5 mg total) by mouth at bedtime as needed for sleep., Disp: 30 tablet, Rfl: 0  Allergies  Allergen Reactions   Lexapro [Escitalopram] Other (See Comments)    suicidal   Sertraline Hcl Other (See Comments)    Suicidality    ROS: See pertinent positives and negatives per HPI.  OBSERVATIONS/OBJECTIVE:  VITALS per patient if applicable: Today's Vitals   06/12/23 1558  Weight: 121 lb (54.9 kg)  Height: 5' (1.524 m)   Body mass index is 23.63 kg/m.    GENERAL: Alert and oriented. Appears well and in no acute distress.  HEENT: Atraumatic. Conjunctiva clear. No obvious abnormalities on inspection of external nose and ears.  NECK: Normal movements of the head and neck.  LUNGS: On inspection, no signs of respiratory distress. Breathing rate appears normal. No obvious gross SOB, gasping or wheezing, and no conversational dyspnea.  CV: No obvious cyanosis.  MS: Moves all visible extremities without noticeable abnormality.  PSYCH/NEURO: Pleasant and cooperative. No obvious depression or anxiety. Speech and thought processing grossly intact.  ASSESSMENT AND PLAN:  Problem List Items Addressed This Visit       Other   Generalized anxiety disorder     Chronic, ongoing.  She states that she has been taking Klonopin 1 mg at bedtime to help with sleep.  UDS shows no benzodiazepine in her system, but was positive for cocaine and THC.  Discussed that she needs to establish with psychiatry and I will no longer refill her Klonopin.  She should decrease to 1 tablet, 0.5 mg at bedtime for 1 week, then every other day for 1 week then stop.  She is in agreement with this saying she does not like to be dependent on medications.  Follow-up at next scheduled appointment      Cocaine use - Primary    Urine drug screen done for Klonopin showed cocaine, THC and no benzodiazepines.  She states that  she does not use cocaine and that her marijuana must have been laced with it without her knowing.  Discussed that I recommend that she not use recreational drugs.  Also discussed that she needs to establish with psychiatry as I will no longer prescribe Klonopin.  Keep next scheduled follow-up appointment.        I discussed the assessment and treatment plan with the patient. The patient was provided an opportunity to ask questions and all were answered. The patient agreed with the plan and demonstrated an understanding of the instructions.   The patient was advised to call back or seek an in-person evaluation if the symptoms worsen or if the condition fails to improve as anticipated.   Gerre Scull, NP

## 2023-07-04 ENCOUNTER — Ambulatory Visit
Admission: EM | Admit: 2023-07-04 | Discharge: 2023-07-04 | Disposition: A | Discharge status: Home / Self Care | Payer: MEDICAID | Attending: Family Medicine | Admitting: Family Medicine

## 2023-07-04 ENCOUNTER — Other Ambulatory Visit: Payer: Self-pay | Admitting: Nurse Practitioner

## 2023-07-04 ENCOUNTER — Encounter: Payer: Self-pay | Admitting: Emergency Medicine

## 2023-07-04 DIAGNOSIS — B3731 Acute candidiasis of vulva and vagina: Secondary | ICD-10-CM

## 2023-07-04 DIAGNOSIS — N760 Acute vaginitis: Secondary | ICD-10-CM | POA: Diagnosis not present

## 2023-07-04 DIAGNOSIS — R052 Subacute cough: Secondary | ICD-10-CM

## 2023-07-04 DIAGNOSIS — B9689 Other specified bacterial agents as the cause of diseases classified elsewhere: Secondary | ICD-10-CM | POA: Diagnosis not present

## 2023-07-04 LAB — URINALYSIS, W/ REFLEX TO CULTURE (INFECTION SUSPECTED)
Bilirubin Urine: NEGATIVE
Glucose, UA: NEGATIVE mg/dL
Hgb urine dipstick: NEGATIVE
Ketones, ur: NEGATIVE mg/dL
Nitrite: NEGATIVE
Protein, ur: NEGATIVE mg/dL
Specific Gravity, Urine: 1.02 (ref 1.005–1.030)
pH: 6 (ref 5.0–8.0)

## 2023-07-04 LAB — WET PREP, GENITAL
Sperm: NONE SEEN
Trich, Wet Prep: NONE SEEN
WBC, Wet Prep HPF POC: >10 — AB (ref <10)

## 2023-07-04 MED ORDER — FLUCONAZOLE 150 MG PO TABS: 150.0000 mg | ORAL_TABLET | ORAL | 0 refills | Status: AC

## 2023-07-04 MED ORDER — PSEUDOEPH-BROMPHEN-DM 30-2-10 MG/5ML PO SYRP: 5.0000 mL | ORAL_SOLUTION | Freq: Four times a day (QID) | ORAL | 0 refills | Status: DC | PRN

## 2023-07-04 MED ORDER — METRONIDAZOLE 500 MG PO TABS: 500.0000 mg | ORAL_TABLET | Freq: Two times a day (BID) | ORAL | 0 refills | Status: DC

## 2023-07-04 NOTE — ED Triage Notes (Signed)
Patient c/o urinary frequency and urgency and dark colored urine that started 4 days ago.  Patient wants STD testing.

## 2023-07-04 NOTE — Discharge Instructions (Signed)
Stop by the pharmacy to pick up your prescriptions.  Follow up with your primary care provider as needed.  

## 2023-07-04 NOTE — ED Provider Notes (Incomplete)
[facility]    CSN: [id number] Arrival date & time: 07/04/23  1519      History   Chief Complaint Chief Complaint  Patient presents with   Urinary Frequency     HPI HPI Kristie Thornton is a 32 y.o. female.    [place] Bow presents for urinary frequency, urinary urgency, and dark her urine for the past 4 days.  Denies known STI exposure.  Jaisa does not use condoms regularly. She is  not currently pregnant.  She request STD testing.     Past Medical History:  Diagnosis Date   Abscess    sub areolar left breast   Anxiety    Back pain    Carpal tunnel syndrome during pregnancy 06/03/2017   Chlamydia infection affecting pregnancy, antepartum 03/26/2017   April 2018- POS [x]  TOC neg   Depression    Endometriosis    Gestational hypertension 10/08/2017   History of IBS    Ovarian cyst    PTSD (post-traumatic stress disorder)    Suicide and self-inflicted injury (HCC)    when a teenager   Vacuum extractor delivery, delivered 10/06/2017    Patient Active Problem List   Diagnosis Date Noted   Cocaine use 06/12/2023   Insomnia 08/26/2022   Amenorrhea 08/26/2022   Change in bowel habits 05/03/2022   BRBPR (bright red blood per rectum) 05/03/2022   Chronic idiopathic constipation 05/03/2022   Financial difficulties 05/01/2022   Gross hematuria 04/03/2022   Pyrosis 03/06/2022   Decreased appetite 03/06/2022   Bilious vomiting with nausea 03/06/2022   Rectal bleeding 03/06/2022   Chronic bilateral lower abdominal pain 03/06/2022   Chronic constipation 03/06/2022   LLQ abdominal pain 02/19/2022   Hordeolum externum of right upper eyelid 01/16/2022   Bipolar 1 disorder (HCC) 12/10/2021   Chronic midline thoracic back pain 11/20/2021   BV (bacterial vaginosis) 11/20/2021   Birth control counseling 11/20/2021   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   Vitamin D deficiency 11/20/2021   Vitamin B12 deficiency 11/20/2021   Vapes nicotine containing  substance 11/20/2021   History of narcotic use 09/10/2017   Generalized anxiety disorder 08/08/2014   Endometriosis 08/08/2014    Past Surgical History:  Procedure Laterality Date   NO PAST SURGERIES      OB History     Gravida  4   Para  1   Term  1   Preterm      AB  3   Living  1      SAB  3   IAB      Ectopic      Multiple  0   Live Births  1            Home Medications    Prior to Admission medications   Medication Sig Start Date End Date Taking? Authorizing Provider  brompheniramine-pseudoephedrine-DM 30-2-10 MG/5ML syrup Take 5 mLs by mouth 4 (four) times daily as needed. 07/04/23  Yes Kristie Girardin, DO  fluconazole (DIFLUCAN) 150 MG tablet Take 1 tablet (150 mg total) by mouth every 3 (three) days for 2 doses. 07/04/23 07/08/23 Yes Kristie Welliver, DO  metroNIDAZOLE (FLAGYL) 500 MG tablet Take 1 tablet (500 mg total) by mouth 2 (two) times daily. 07/04/23  Yes Kristie Hernandes, DO  clonazePAM (KLONOPIN) 0.5 MG tablet Take 1 tablet (0.5 mg total) by mouth 2 (two) times daily as needed for anxiety. 06/09/23   McElwee, Lauren A, NP  cyclobenzaprine (FLEXERIL) 10 MG tablet Take 1  tablet (10 mg total) by mouth at bedtime. 04/17/23   Juanda Chance, NP  gabapentin (NEURONTIN) 600 MG tablet Take 1 tablet (600 mg total) by mouth 3 (three) times daily. Take 1 tablet in the morning, 1 tablet in the afternoon, and 1.5 tablets at bedtime 04/26/22   McElwee, Lauren A, NP  ibuprofen (ADVIL) 600 MG tablet Take 1 tablet (600 mg total) by mouth every 8 (eight) hours as needed. 02/11/23   McElwee, Lauren A, NP  meloxicam (MOBIC) 15 MG tablet Take 1 tablet (15 mg total) by mouth daily. Do not take with ibuprofen 05/05/23   McElwee, Lauren A, NP  Vitamin D, Ergocalciferol, (DRISDOL) 1.25 MG (50000 UNIT) CAPS capsule Take 1 capsule (50,000 Units total) by mouth every 7 (seven) days. 02/11/23   McElwee, Lauren A, NP  zolpidem (AMBIEN) 5 MG tablet Take 1 tablet (5 mg total) by mouth at  bedtime as needed for sleep. 05/16/23   McElwee, Jake Church, NP    Family History Family History  Problem Relation Age of Onset   Cancer Mother        colon cancer   Colon polyps Mother    Stroke Father    Cancer Father 48       testicular cancer   Liver disease Sister    Fibromyalgia Sister    Pancreatic disease Sister    ADD / ADHD Sister    ADD / ADHD Sister    ADD / ADHD Brother    Colon cancer Neg Hx    Esophageal cancer Neg Hx    Inflammatory bowel disease Neg Hx    Pancreatic cancer Neg Hx    Rectal cancer Neg Hx    Stomach cancer Neg Hx     Social History Social History   Tobacco Use   Smoking status: Former    Types: Cigarettes   Smokeless tobacco: Never   Tobacco comments:    stopped 02/2017 previously vaped  Vaping Use   Vaping status: Every Day  Substance Use Topics   Alcohol use: No    Alcohol/week: 0.0 standard drinks of alcohol    Comment: stopped 01/2017 occasional   Drug use: No    Types: Marijuana    Comment: 03/12/17 last week ago, trying to stop     Allergies   Lexapro [escitalopram] and Sertraline hcl   Review of Systems Review of Systems: :negative unless otherwise stated in HPI.      Physical Exam Triage Vital Signs ED Triage Vitals  Encounter Vitals Group     BP 07/04/23 1535 128/88     Systolic BP Percentile --      Diastolic BP Percentile --      Pulse Rate 07/04/23 1535 (!) 121     Resp 07/04/23 1535 14     Temp 07/04/23 1535 98.6 F (37 C)     Temp Source 07/04/23 1535 Oral     SpO2 07/04/23 1535 96 %     Weight 07/04/23 1533 115 lb (52.2 kg)     Height 07/04/23 1533 5' (1.524 m)     Head Circumference --      Peak Flow --      Pain Score 07/04/23 1533 4     Pain Loc --      Pain Education --      Exclude from Growth Chart --    No data found.  Updated Vital Signs BP 128/88 (BP Location: Right Arm)   Pulse (!) 121  Temp 98.6 F (37 C) (Oral)   Resp 14   Ht 5' (1.524 m)   Wt 52.2 kg   LMP 05/17/2023 (Exact  Date)   SpO2 96%   BMI 22.46 kg/m   Visual Acuity Right Eye Distance:   Left Eye Distance:   Bilateral Distance:    Right Eye Near:   Left Eye Near:    Bilateral Near:     Physical Exam GEN: well appearing female in no acute distress  CVS: well perfused, regular rhythm, tachycardic RESP: speaking in full sentences without pause  GU: deferred, patient performed self swab     UC Treatments / Results  Labs (all labs ordered are listed, but only abnormal results are displayed) Labs Reviewed  WET PREP, GENITAL - Abnormal; Notable for the following components:      Result Value   Yeast Wet Prep HPF POC PRESENT (*)    Clue Cells Wet Prep HPF POC PRESENT (*)    WBC, Wet Prep HPF POC >10 (*)    All other components within normal limits  URINALYSIS, W/ REFLEX TO CULTURE (INFECTION SUSPECTED) - Abnormal; Notable for the following components:   Leukocytes,Ua TRACE (*)    Bacteria, UA FEW (*)    All other components within normal limits  URINE CULTURE  CERVICOVAGINAL ANCILLARY ONLY    EKG   Radiology No results found.  Procedures Procedures (including critical care time)  Medications Ordered in UC Medications - No data to display  Initial Impression / Assessment and Plan / UC Course  I have reviewed the triage vital signs and the nursing notes.  Pertinent labs & imaging results that were available during my care of the patient were reviewed by me and considered in my medical decision making (see chart for details).      Patient is a 31 y.o.Marland Kitchen female  who presents for urinary overall patient is well-appearing and afebrile.  Vital signs stable.  UA not consistent with acute cystitis.  Urine culture obtained.  Follow-up sensitivities and start antibiotics, if needed.  Wet prep showing evidence of yeast vaginitis and bacterial vaginitis but no trichomonas.  Self care instructions given including avoiding douching Gonorrhea and Chlamydia testing obtained.   - Treatment:  Flagyl 500 BID x 7 days and advised patient to not drink alcohol while taking this medication. Diflucan for 2 doses for  yeast infection  Return precautions including abdominal pain, fever, chills, nausea, or vomiting given.   On the way out the door patient asked for refill of her medication. I asked which medication and her Tussionex.  States she was treated for bronchitis at the ED last month her symptoms got worse but then they came back.  On exam, she does have some rhonchi.  She has tachycardic as well.  States that she continues to vape.  Explained to patient that Tussionex contains hydrocodone which she is on backorder and we will try Bromfed instead.  Discussed MDM, treatment plan and plan for follow-up with patient who agrees with plan.    12:29 PM 07/05/23 Pt returns to the asking for Tussinex prescription. Per chart review, pt has history of cocaine use and opioid abuse.  She recently tested positive for cocaine and THC on her UDS at her PCPs office where she is prescribed Klonopin.  Interestingly, she did not test positive for benzodiazepines though she is prescribed this medication. Tussinex is not a safe prescription for her to use at this time. Request declined.  Advised to use  the Bromfed that was prescribed yesterday.   Final Clinical Impressions(s) / UC Diagnoses   Final diagnoses:  Yeast vaginitis  BV (bacterial vaginosis)  Subacute cough     Discharge Instructions      Stop by the pharmacy to pick up your prescriptions.  Follow up with your primary care provider as needed.       ED Prescriptions     Medication Sig Dispense Auth. Provider   metroNIDAZOLE (FLAGYL) 500 MG tablet Take 1 tablet (500 mg total) by mouth 2 (two) times daily. 14 tablet Kristie Crownover, DO   fluconazole (DIFLUCAN) 150 MG tablet Take 1 tablet (150 mg total) by mouth every 3 (three) days for 2 doses. 2 tablet Kristie Jia, DO   brompheniramine-pseudoephedrine-DM 30-2-10 MG/5ML syrup Take 5  mLs by mouth 4 (four) times daily as needed. 120 mL Kristie Thornton, Seward Meth, DO      I have reviewed the PDMP during this encounter.      Katha Cabal, DO 07/08/23 1136

## 2023-07-06 LAB — URINE CULTURE: Culture: NO GROWTH

## 2023-07-07 ENCOUNTER — Encounter: Payer: Self-pay | Admitting: Nurse Practitioner

## 2023-07-07 ENCOUNTER — Telehealth (INDEPENDENT_AMBULATORY_CARE_PROVIDER_SITE_OTHER): Payer: MEDICAID | Admitting: Nurse Practitioner

## 2023-07-07 VITALS — Temp 99.0°F | Ht 60.0 in | Wt 115.0 lb

## 2023-07-07 DIAGNOSIS — R051 Acute cough: Secondary | ICD-10-CM | POA: Diagnosis not present

## 2023-07-07 LAB — CERVICOVAGINAL ANCILLARY ONLY
Chlamydia: NEGATIVE
Comment: NEGATIVE
Comment: NORMAL
Neisseria Gonorrhea: NEGATIVE

## 2023-07-07 MED ORDER — HYDROCOD POLI-CHLORPHE POLI ER 10-8 MG/5ML PO SUER: 5.0000 mL | Freq: Every evening | ORAL | 0 refills | Status: DC | PRN

## 2023-07-07 NOTE — Patient Instructions (Signed)
It was great to see you!  Start tussionex at bedtime as needed for cough.  Keep drinking plenty of fluids.   Keep your appointment with the allergist.   Let's follow-up if symptoms worsen or any concerns.  Take care,  Rodman Pickle, NP

## 2023-07-07 NOTE — Progress Notes (Signed)
[facility] [address] [place] [address] Dept: [phone] Dept Fax: [phone]  Virtual Video Visit  I connected with Kristie Thornton on 07/07/23 at  4:20 PM EDT by a video enabled telemedicine application and verified that I am speaking with the correct person using two identifiers.  Location patient: Home Location provider: Clinic Persons participating in the virtual visit: Patient; Kristie Pickle, NP; Malena Peer, CMA  I discussed the limitations of evaluation and management by telemedicine and the availability of in person appointments. The patient expressed understanding and agreed to proceed.  Chief Complaint  Patient presents with   Cough    With slight fever for 3 days, wheezing    SUBJECTIVE:  HPI: Kristie Thornton is a 32 y.o. female who presents with cough and wheezing for 3 days.   She states that she started to feel better for a week and then her symptoms worsened again with the change in weather.  She also has been dusting at work.  She went to urgent care and was given some cough syrup which did not help.  She is wanting Tussidex for her cough as this seems to be only thing that works.  She states that her cough is worse at night.  She denies shortness of breath and chest pain.  Patient Active Problem List   Diagnosis Date Noted   Cocaine use 06/12/2023   Insomnia 08/26/2022   Amenorrhea 08/26/2022   Change in bowel habits 05/03/2022   BRBPR (bright red blood per rectum) 05/03/2022   Chronic idiopathic constipation 05/03/2022   Financial difficulties 05/01/2022   Gross hematuria 04/03/2022   Pyrosis 03/06/2022   Decreased appetite 03/06/2022   Bilious vomiting with nausea 03/06/2022   Rectal bleeding 03/06/2022   Chronic bilateral lower abdominal pain 03/06/2022   Chronic constipation 03/06/2022   LLQ abdominal pain 02/19/2022   Hordeolum externum of right upper eyelid 01/16/2022   Bipolar 1  disorder (HCC) 12/10/2021   Chronic midline thoracic back pain 11/20/2021   BV (bacterial vaginosis) 11/20/2021   Birth control counseling 11/20/2021   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   Vitamin D deficiency 11/20/2021   Vitamin B12 deficiency 11/20/2021   Vapes nicotine containing substance 11/20/2021   History of narcotic use 09/10/2017   Generalized anxiety disorder 08/08/2014   Endometriosis 08/08/2014    Past Surgical History:  Procedure Laterality Date   NO PAST SURGERIES      Family History  Problem Relation Age of Onset   Cancer Mother        colon cancer   Colon polyps Mother    Stroke Father    Cancer Father 50       testicular cancer   Liver disease Sister    Fibromyalgia Sister    Pancreatic disease Sister    ADD / ADHD Sister    ADD / ADHD Sister    ADD / ADHD Brother    Colon cancer Neg Hx    Esophageal cancer Neg Hx    Inflammatory bowel disease Neg Hx    Pancreatic cancer Neg Hx    Rectal cancer Neg Hx    Stomach cancer Neg Hx     Social History   Tobacco Use   Smoking status: Former    Types: Cigarettes   Smokeless tobacco: Never   Tobacco comments:    stopped 02/2017 previously vaped  Vaping Use   Vaping status: Every Day  Substance Use Topics  Alcohol use: No    Alcohol/week: 0.0 standard drinks of alcohol    Comment: stopped 01/2017 occasional   Drug use: No    Types: Marijuana    Comment: 03/12/17 last week ago, trying to stop     Current Outpatient Medications:    chlorpheniramine-HYDROcodone (TUSSIONEX) 10-8 MG/5ML, Take 5 mLs by mouth at bedtime as needed for cough., Disp: 70 mL, Rfl: 0   clonazePAM (KLONOPIN) 0.5 MG tablet, Take 1 tablet (0.5 mg total) by mouth 2 (two) times daily as needed for anxiety., Disp: 60 tablet, Rfl: 0   cyclobenzaprine (FLEXERIL) 10 MG tablet, Take 1 tablet (10 mg total) by mouth at bedtime., Disp: 30 tablet, Rfl: 0   fluconazole (DIFLUCAN) 150 MG tablet, Take 1 tablet (150 mg total)  by mouth every 3 (three) days for 2 doses., Disp: 2 tablet, Rfl: 0   gabapentin (NEURONTIN) 600 MG tablet, Take 1 tablet (600 mg total) by mouth 3 (three) times daily. Take 1 tablet in the morning, 1 tablet in the afternoon, and 1.5 tablets at bedtime, Disp: 270 tablet, Rfl: 1   ibuprofen (ADVIL) 600 MG tablet, Take 1 tablet (600 mg total) by mouth every 8 (eight) hours as needed., Disp: 30 tablet, Rfl: 1   meloxicam (MOBIC) 15 MG tablet, Take 1 tablet (15 mg total) by mouth daily. Do not take with ibuprofen, Disp: 30 tablet, Rfl: 0   metroNIDAZOLE (FLAGYL) 500 MG tablet, Take 1 tablet (500 mg total) by mouth 2 (two) times daily., Disp: 14 tablet, Rfl: 0   Vitamin D, Ergocalciferol, (DRISDOL) 1.25 MG (50000 UNIT) CAPS capsule, Take 1 capsule (50,000 Units total) by mouth every 7 (seven) days., Disp: 12 capsule, Rfl: 0  Allergies  Allergen Reactions   Lexapro [Escitalopram] Other (See Comments)    suicidal   Sertraline Hcl Other (See Comments)    Suicidality    ROS: See pertinent positives and negatives per HPI.  OBSERVATIONS/OBJECTIVE:  VITALS per patient if applicable: Today's Vitals   07/07/23 1621  Temp: 99 F (37.2 C)  TempSrc: Temporal  Weight: 115 lb (52.2 kg)  Height: 5' (1.524 m)   Body mass index is 22.46 kg/m.    GENERAL: Alert and oriented. Appears well and in no acute distress.  HEENT: Atraumatic. Conjunctiva clear. No obvious abnormalities on inspection of external nose and ears.  NECK: Normal movements of the head and neck.  LUNGS: On inspection, no signs of respiratory distress. Breathing rate appears normal. No obvious gross SOB, gasping or wheezing, and no conversational dyspnea.  CV: No obvious cyanosis.  MS: Moves all visible extremities without noticeable abnormality.  PSYCH/NEURO: Pleasant and cooperative. No obvious depression or anxiety. Speech and thought processing grossly intact.  ASSESSMENT AND PLAN:  Problem List Items Addressed This Visit    None Visit Diagnoses     Acute cough    -  Primary   Will refill tussionex once more. Encourage fluids, rest. F/U if symptoms don't improve or worsen. She has an appt coming up with allergist.        I discussed the assessment and treatment plan with the patient. The patient was provided an opportunity to ask questions and all were answered. The patient agreed with the plan and demonstrated an understanding of the instructions.   The patient was advised to call back or seek an in-person evaluation if the symptoms worsen or if the condition fails to improve as anticipated.   Gerre Scull, NP

## 2023-07-13 NOTE — Progress Notes (Unsigned)
New Patient Note  RE: Kristie Thornton MRN: [id number] DOB: 02-22-1991 Date of Office Visit: 07/14/2023  Consult requested by: Gerre Scull, NP Primary care provider: Gerre Scull, NP  Chief Complaint: No chief complaint on file.  History of Present Illness: I had the pleasure of seeing Kristie Thornton for initial evaluation at the [facility] on 07/13/2023. She is a 32 y.o. female, who is referred here by Gerre Scull, NP for the evaluation of ***.  ***  Assessment and Plan: Shelah is a 32 y.o. female with: ***  No follow-ups on file.  No orders of the defined types were placed in this encounter.  Lab Orders  No laboratory test(s) ordered today    Other allergy screening: Asthma: {Blank single:19197::"yes","no"} Rhino conjunctivitis: {Blank single:19197::"yes","no"} Food allergy: {Blank single:19197::"yes","no"} Medication allergy: {Blank single:19197::"yes","no"} Hymenoptera allergy: {Blank single:19197::"yes","no"} Urticaria: {Blank single:19197::"yes","no"} Eczema:{Blank single:19197::"yes","no"} History of recurrent infections suggestive of immunodeficency: {Blank single:19197::"yes","no"}  Diagnostics: Spirometry:  Tracings reviewed. Her effort: {Blank single:19197::"Good reproducible efforts.","It was hard to get consistent efforts and there is a question as to whether this reflects a maximal maneuver.","Poor effort, data can not be interpreted."} FVC: ***L FEV1: ***L, ***% predicted FEV1/FVC ratio: ***% Interpretation: {Blank single:19197::"Spirometry consistent with mild obstructive disease","Spirometry consistent with moderate obstructive disease","Spirometry consistent with severe obstructive disease","Spirometry consistent with possible restrictive disease","Spirometry consistent with mixed obstructive and restrictive disease","Spirometry uninterpretable due to technique","Spirometry consistent with normal pattern","No overt abnormalities  noted given today's efforts"}.  Please see scanned spirometry results for details.  Skin Testing: {Blank single:19197::"Select foods","Environmental allergy panel","Environmental allergy panel and select foods","Food allergy panel","None","Deferred due to recent antihistamines use"}. *** Results discussed with patient/family.   Past Medical History: Patient Active Problem List   Diagnosis Date Noted  . Cocaine use 06/12/2023  . Insomnia 08/26/2022  . Amenorrhea 08/26/2022  . Change in bowel habits 05/03/2022  . BRBPR (bright red blood per rectum) 05/03/2022  . Chronic idiopathic constipation 05/03/2022  . Financial difficulties 05/01/2022  . Gross hematuria 04/03/2022  . Pyrosis 03/06/2022  . Decreased appetite 03/06/2022  . Bilious vomiting with nausea 03/06/2022  . Rectal bleeding 03/06/2022  . Chronic bilateral lower abdominal pain 03/06/2022  . Chronic constipation 03/06/2022  . LLQ abdominal pain 02/19/2022  . Hordeolum externum of right upper eyelid 01/16/2022  . Bipolar 1 disorder (HCC) 12/10/2021  . Chronic midline thoracic back pain 11/20/2021  . BV (bacterial vaginosis) 11/20/2021  . Birth control counseling 11/20/2021  . 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  . Vitamin D deficiency 11/20/2021  . Vitamin B12 deficiency 11/20/2021  . Vapes nicotine containing substance 11/20/2021  . History of narcotic use 09/10/2017  . Generalized anxiety disorder 08/08/2014  . Endometriosis 08/08/2014   Past Medical History:  Diagnosis Date  . Abscess    sub areolar left breast  . Anxiety   . Back pain   . Carpal tunnel syndrome during pregnancy 06/03/2017  . Chlamydia infection affecting pregnancy, antepartum 03/26/2017   April 2018- POS [x]  TOC neg  . Depression   . Endometriosis   . Gestational hypertension 10/08/2017  . History of IBS   . Ovarian cyst   . PTSD (post-traumatic stress disorder)   . Suicide and self-inflicted injury [facility])    when a  teenager  . Vacuum extractor delivery, delivered 10/06/2017   Past Surgical History: Past Surgical History:  Procedure Laterality Date  . NO PAST SURGERIES     Medication List:  Current Outpatient Medications  Medication  Sig Dispense Refill  . chlorpheniramine-HYDROcodone (TUSSIONEX) 10-8 MG/5ML Take 5 mLs by mouth at bedtime as needed for cough. 70 mL 0  . clonazePAM (KLONOPIN) 0.5 MG tablet Take 1 tablet (0.5 mg total) by mouth 2 (two) times daily as needed for anxiety. 60 tablet 0  . cyclobenzaprine (FLEXERIL) 10 MG tablet Take 1 tablet (10 mg total) by mouth at bedtime. 30 tablet 0  . gabapentin (NEURONTIN) 600 MG tablet Take 1 tablet (600 mg total) by mouth 3 (three) times daily. Take 1 tablet in the morning, 1 tablet in the afternoon, and 1.5 tablets at bedtime 270 tablet 1  . ibuprofen (ADVIL) 600 MG tablet Take 1 tablet (600 mg total) by mouth every 8 (eight) hours as needed. 30 tablet 1  . meloxicam (MOBIC) 15 MG tablet Take 1 tablet (15 mg total) by mouth daily. Do not take with ibuprofen 30 tablet 0  . metroNIDAZOLE (FLAGYL) 500 MG tablet Take 1 tablet (500 mg total) by mouth 2 (two) times daily. 14 tablet 0  . Vitamin D, Ergocalciferol, (DRISDOL) 1.25 MG (50000 UNIT) CAPS capsule Take 1 capsule (50,000 Units total) by mouth every 7 (seven) days. 12 capsule 0   No current facility-administered medications for this visit.   Allergies: Allergies  Allergen Reactions  . Lexapro [Escitalopram] Other (See Comments)    suicidal  . Sertraline Hcl Other (See Comments)    Suicidality   Social History: Social History   Socioeconomic History  . Marital status: Divorced    Spouse name: n/a  . Number of children: 0  . Years of education: 12th +  . Highest education level: Not on file  Occupational History  . Occupation: Investment banker, corporate: STUDENT    Comment: [employer]  Tobacco Use  . Smoking status: Former    Types: Cigarettes  . Smokeless tobacco: Never  . Tobacco  comments:    stopped 02/2017 previously vaped  Vaping Use  . Vaping status: Every Day  Substance and Sexual Activity  . Alcohol use: No    Alcohol/week: 0.0 standard drinks of alcohol    Comment: stopped 01/2017 occasional  . Drug use: No    Types: Marijuana    Comment: 03/12/17 last week ago, trying to stop  . Sexual activity: Yes    Partners: Male    Birth control/protection: None  Other Topics Concern  . Not on file  Social History Narrative   Parents are from [place]. Parents and her siblings were born in the [place].   Nail tech   Marital status: divorced      Children: one daughter     Lives: with duaghter      Employment: nail tech      Tobacco:  None      Alcohol: weekends; no DWIs      Drugs: marijuana socially         Social Determinants of Health   Financial Resource Strain: High Risk (05/24/2022)   Overall Financial Resource Strain (CARDIA)   . Difficulty of Paying Living Expenses: Hard  Food Insecurity: Food Insecurity Present (05/24/2022)   Hunger Vital Sign   . Worried About Programme researcher, broadcasting/film/video in the Last Year: Sometimes true   . Ran Out of Food in the Last Year: Never true  Transportation Needs: Not on file  Physical Activity: Not on file  Stress: Not on file  Social Connections: Unknown (02/28/2022)   [facility]   Social Network   .  Social Network: Not on file   Lives in a ***. Smoking: *** Occupation: ***  Environmental HistorySurveyor, minerals in the house: Copywriter, advertising in the family room: {Blank single:19197::"yes","no"} Carpet in the bedroom: {Blank single:19197::"yes","no"} Heating: {Blank single:19197::"electric","gas","heat pump"} Cooling: {Blank single:19197::"central","window","heat pump"} Pet: {Blank single:19197::"yes ***","no"}  Family History: Family History  Problem Relation Age of Onset  . Cancer Mother        colon cancer  . Colon polyps Mother   . Stroke Father   .  Cancer Father 44       testicular cancer  . Liver disease Sister   . Fibromyalgia Sister   . Pancreatic disease Sister   . ADD / ADHD Sister   . ADD / ADHD Sister   . ADD / ADHD Brother   . Colon cancer Neg Hx   . Esophageal cancer Neg Hx   . Inflammatory bowel disease Neg Hx   . Pancreatic cancer Neg Hx   . Rectal cancer Neg Hx   . Stomach cancer Neg Hx    Problem                               Relation Asthma                                   *** Eczema                                *** Food allergy                          *** Allergic rhino conjunctivitis     ***  Review of Systems  Constitutional:  Negative for appetite change, chills, fever and unexpected weight change.  HENT:  Negative for congestion and rhinorrhea.   Eyes:  Negative for itching.  Respiratory:  Negative for cough, chest tightness, shortness of breath and wheezing.   Cardiovascular:  Negative for chest pain.  Gastrointestinal:  Negative for abdominal pain.  Genitourinary:  Negative for difficulty urinating.  Skin:  Negative for rash.  Neurological:  Negative for headaches.   Objective: There were no vitals taken for this visit. There is no height or weight on file to calculate BMI. Physical Exam Vitals and nursing note reviewed.  Constitutional:      Appearance: Normal appearance. She is well-developed.  HENT:     Head: Normocephalic and atraumatic.     Right Ear: Tympanic membrane and external ear normal.     Left Ear: Tympanic membrane and external ear normal.     Nose: Nose normal.     Mouth/Throat:     Mouth: Mucous membranes are moist.     Pharynx: Oropharynx is clear.  Eyes:     Conjunctiva/sclera: Conjunctivae normal.  Cardiovascular:     Rate and Rhythm: Normal rate and regular rhythm.     Heart sounds: Normal heart sounds. No murmur heard.    No friction rub. No gallop.  Pulmonary:     Effort: Pulmonary effort is normal.     Breath sounds: Normal breath sounds. No wheezing,  rhonchi or rales.  Musculoskeletal:     Cervical back: Neck supple.  Skin:    General: Skin is warm.     Findings: No rash.  Neurological:  Mental Status: She is alert and oriented to person, place, and time.  Psychiatric:        Behavior: Behavior normal.  The plan was reviewed with the patient/family, and all questions/concerned were addressed.  It was my pleasure to see Dejia today and participate in her care. Please feel free to contact me with any questions or concerns.  Sincerely,  Wyline Mood, DO Allergy & Immunology  [facility] office: [phone] [facility] office: [phone]

## 2023-07-14 ENCOUNTER — Encounter: Payer: Self-pay | Admitting: Allergy

## 2023-07-14 ENCOUNTER — Ambulatory Visit (INDEPENDENT_AMBULATORY_CARE_PROVIDER_SITE_OTHER): Payer: MEDICAID | Admitting: Allergy

## 2023-07-14 ENCOUNTER — Other Ambulatory Visit: Payer: Self-pay

## 2023-07-14 VITALS — BP 98/60 | HR 89 | Temp 97.2°F | Resp 16 | Ht 61.42 in | Wt 124.8 lb

## 2023-07-14 DIAGNOSIS — R21 Rash and other nonspecific skin eruption: Secondary | ICD-10-CM | POA: Diagnosis not present

## 2023-07-14 DIAGNOSIS — J3089 Other allergic rhinitis: Secondary | ICD-10-CM | POA: Diagnosis not present

## 2023-07-14 NOTE — Patient Instructions (Signed)
Rash Etiology unclear. Based on clinical history and distrubution unlikely to be allergic in nature. Concerned about post infectious rash? No work up needed at this time.  If this happens again: Take pictures and write down what you had done or eaten that day. Start zyrtec (cetirizine) 10mg  OR allegra (fexofenadine) 180mg  twice a day. If symptoms are not controlled or causes drowsiness let us know. Start Pepcid (famotidine) 20mg  twice a day.   Return if symptoms worsen or fail to improve.

## 2023-07-31 ENCOUNTER — Telehealth: Payer: MEDICAID | Admitting: Nurse Practitioner

## 2023-07-31 ENCOUNTER — Other Ambulatory Visit: Payer: Self-pay | Admitting: Nurse Practitioner

## 2023-07-31 ENCOUNTER — Encounter: Payer: MEDICAID | Admitting: Physician Assistant

## 2023-07-31 ENCOUNTER — Encounter: Payer: Self-pay | Admitting: Nurse Practitioner

## 2023-07-31 VITALS — Ht 61.0 in | Wt 124.0 lb

## 2023-07-31 DIAGNOSIS — J40 Bronchitis, not specified as acute or chronic: Secondary | ICD-10-CM | POA: Diagnosis not present

## 2023-07-31 MED ORDER — CROMOLYN SODIUM 4 % OP SOLN: 1.0000 [drp] | Freq: Two times a day (BID) | OPHTHALMIC | 0 refills | Status: DC | PRN

## 2023-07-31 MED ORDER — BENZONATATE 100 MG PO CAPS: 100.0000 mg | ORAL_CAPSULE | Freq: Three times a day (TID) | ORAL | 0 refills | Status: DC | PRN

## 2023-07-31 MED ORDER — MONTELUKAST SODIUM 10 MG PO TABS: 10.0000 mg | ORAL_TABLET | Freq: Every day | ORAL | 1 refills | Status: DC

## 2023-07-31 MED ORDER — ALBUTEROL SULFATE HFA 108 (90 BASE) MCG/ACT IN AERS: 2.0000 | INHALATION_SPRAY | Freq: Four times a day (QID) | RESPIRATORY_TRACT | 0 refills | Status: DC | PRN

## 2023-07-31 MED ORDER — GABAPENTIN 600 MG PO TABS: 600.0000 mg | ORAL_TABLET | Freq: Three times a day (TID) | ORAL | 1 refills | Status: DC

## 2023-07-31 NOTE — Progress Notes (Signed)
[facility] [address] [place] [address] Dept: [phone] Dept Fax: [phone]  Virtual Video Visit  I connected with Kristie Thornton on 07/31/23 at  4:20 PM EDT by a video enabled telemedicine application and verified that I am speaking with the correct person using two identifiers.  Location patient: Home Location provider: Clinic Persons participating in the virtual visit: Patient; Rodman Pickle, NP; Malena Peer, CMA  I discussed the limitations of evaluation and management by telemedicine and the availability of in person appointments. The patient expressed understanding and agreed to proceed.  Chief Complaint  Patient presents with   Medication Management    Rx Refill    SUBJECTIVE:  HPI: Kristie Thornton is a 32 y.o. female who presents with ongoing cough.   She states that she stopped smoking and vaping recently. She has still been having a congested cough, but is not coughing anything up. The coughing has caused her to have a sore throat. She denies fevers, but has had some nasal congestion. This is also associated with some wheezing. She denies chest pain and shortness of breath. She went to urgent care and was given a cough medication that ended in DM.   Patient Active Problem List   Diagnosis Date Noted   Bronchitis 07/31/2023   Insomnia 08/26/2022   Amenorrhea 08/26/2022   Change in bowel habits 05/03/2022   BRBPR (bright red blood per rectum) 05/03/2022   Chronic idiopathic constipation 05/03/2022   Financial difficulties 05/01/2022   Gross hematuria 04/03/2022   Pyrosis 03/06/2022   Decreased appetite 03/06/2022   Bilious vomiting with nausea 03/06/2022   Rectal bleeding 03/06/2022   Chronic bilateral lower abdominal pain 03/06/2022   Chronic constipation 03/06/2022   LLQ abdominal pain 02/19/2022   Hordeolum externum of right upper eyelid 01/16/2022   Bipolar 1 disorder (HCC) 12/10/2021    Chronic midline thoracic back pain 11/20/2021   BV (bacterial vaginosis) 11/20/2021   Birth control counseling 11/20/2021   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   Vitamin D deficiency 11/20/2021   Vitamin B12 deficiency 11/20/2021   Vapes nicotine containing substance 11/20/2021   History of narcotic use 09/10/2017   Generalized anxiety disorder 08/08/2014   Endometriosis 08/08/2014    Past Surgical History:  Procedure Laterality Date   NO PAST SURGERIES      Family History  Problem Relation Age of Onset   Allergic rhinitis Mother    Cancer Mother        colon cancer   Colon polyps Mother    Allergic rhinitis Father    Stroke Father    Cancer Father 36       testicular cancer   Liver disease Sister    Fibromyalgia Sister    Pancreatic disease Sister    ADD / ADHD Sister    ADD / ADHD Sister    ADD / ADHD Brother    Colon cancer Neg Hx    Esophageal cancer Neg Hx    Inflammatory bowel disease Neg Hx    Pancreatic cancer Neg Hx    Rectal cancer Neg Hx    Stomach cancer Neg Hx     Social History   Tobacco Use   Smoking status: Former    Types: Cigarettes    Passive exposure: Past   Smokeless tobacco: Never   Tobacco comments:    stopped 02/2017 previously vaped  Vaping Use   Vaping status: Every Day  Substance Use Topics  Alcohol use: No    Alcohol/week: 0.0 standard drinks of alcohol    Comment: stopped 01/2017 occasional   Drug use: No    Types: Marijuana    Comment: 03/12/17 last week ago, trying to stop     Current Outpatient Medications:    albuterol (VENTOLIN HFA) 108 (90 Base) MCG/ACT inhaler, Inhale 2 puffs into the lungs every 6 (six) hours as needed for wheezing or shortness of breath., Disp: 8 g, Rfl: 0   benzonatate (TESSALON) 100 MG capsule, Take 1 capsule (100 mg total) by mouth 3 (three) times daily as needed for cough., Disp: 30 capsule, Rfl: 0   chlorpheniramine-HYDROcodone (TUSSIONEX) 10-8 MG/5ML, Take 5 mLs by mouth at  bedtime as needed for cough., Disp: 70 mL, Rfl: 0   clonazePAM (KLONOPIN) 0.5 MG tablet, Take 1 tablet (0.5 mg total) by mouth 2 (two) times daily as needed for anxiety., Disp: 60 tablet, Rfl: 0   cromolyn (OPTICROM) 4 % ophthalmic solution, Place 1 drop into both eyes 2 (two) times daily as needed (itching eye)., Disp: 2.5 mL, Rfl: 0   cyclobenzaprine (FLEXERIL) 10 MG tablet, Take 1 tablet (10 mg total) by mouth at bedtime., Disp: 30 tablet, Rfl: 0   gabapentin (NEURONTIN) 600 MG tablet, Take 1 tablet (600 mg total) by mouth 3 (three) times daily. Take 1 tablet in the morning, 1 tablet in the afternoon, and 1.5 tablets at bedtime, Disp: 270 tablet, Rfl: 1   ibuprofen (ADVIL) 600 MG tablet, Take 1 tablet (600 mg total) by mouth every 8 (eight) hours as needed., Disp: 30 tablet, Rfl: 1   meloxicam (MOBIC) 15 MG tablet, Take 1 tablet (15 mg total) by mouth daily. Do not take with ibuprofen, Disp: 30 tablet, Rfl: 0   metroNIDAZOLE (FLAGYL) 500 MG tablet, Take 1 tablet (500 mg total) by mouth 2 (two) times daily., Disp: 14 tablet, Rfl: 0   montelukast (SINGULAIR) 10 MG tablet, Take 1 tablet (10 mg total) by mouth at bedtime., Disp: 30 tablet, Rfl: 1   Vitamin D, Ergocalciferol, (DRISDOL) 1.25 MG (50000 UNIT) CAPS capsule, Take 1 capsule (50,000 Units total) by mouth every 7 (seven) days., Disp: 12 capsule, Rfl: 0  Allergies  Allergen Reactions   Lexapro [Escitalopram] Other (See Comments)    suicidal   Sertraline Hcl Other (See Comments)    Suicidality    ROS: See pertinent positives and negatives per HPI.  OBSERVATIONS/OBJECTIVE:  VITALS per patient if applicable: Today's Vitals   07/31/23 1632  Weight: 124 lb (56.2 kg)  Height: 5\' 1"  (1.549 m)   Body mass index is 23.43 kg/m.    GENERAL: Alert and oriented. Appears well and in no acute distress.  HEENT: Atraumatic. Conjunctiva clear. No obvious abnormalities on inspection of external nose and ears.  NECK: Normal movements of the  head and neck.  LUNGS: On inspection, no signs of respiratory distress. Breathing rate appears normal. No obvious gross SOB, gasping or wheezing, and no conversational dyspnea.  CV: No obvious cyanosis.  MS: Moves all visible extremities without noticeable abnormality.  PSYCH/NEURO: Pleasant and cooperative. No obvious depression or anxiety. Speech and thought processing grossly intact.  ASSESSMENT AND PLAN:  Problem List Items Addressed This Visit       Respiratory   Bronchitis - Primary    Ongoing, will have her start albuterol inhaler prn, tessalon perles TID prn cough. Encourage fluids. Congratulated her on stopping smoking/vaping.  Will also have her start singulair 10mg  at bedtime and continue zyrtec  10mg  daily. Follow-up if not improving.         I discussed the assessment and treatment plan with the patient. The patient was provided an opportunity to ask questions and all were answered. The patient agreed with the plan and demonstrated an understanding of the instructions.   The patient was advised to call back or seek an in-person evaluation if the symptoms worsen or if the condition fails to improve as anticipated.   Gerre Scull, NP

## 2023-07-31 NOTE — Progress Notes (Signed)
Patient needing to scheduled appt with PCP. This appt will be marked erroneous.  This encounter was created in error - please disregard.

## 2023-07-31 NOTE — Patient Instructions (Signed)
It was great to see you!  Start singulair 1 tablet at bedtime.  Keep taking the zyrtec daily  Start tessalon perles 1 capsule 3 times a day as needed for cough.  Drink plenty of fluids.   Start albuterol inhaler every 6 hours as needed for shortness of breath or wheezing.   Let's follow-up if your symptoms worsen or don't improve.   Take care,  Rodman Pickle, NP

## 2023-07-31 NOTE — Assessment & Plan Note (Signed)
Ongoing, will have her start albuterol inhaler prn, tessalon perles TID prn cough. Encourage fluids. Congratulated her on stopping smoking/vaping.  Will also have her start singulair 10mg  at bedtime and continue zyrtec 10mg  daily. Follow-up if not improving.

## 2023-08-01 ENCOUNTER — Telehealth: Payer: MEDICAID | Admitting: Nurse Practitioner

## 2023-08-19 ENCOUNTER — Other Ambulatory Visit: Payer: Self-pay

## 2023-08-19 ENCOUNTER — Ambulatory Visit
Admission: EM | Admit: 2023-08-19 | Discharge: 2023-08-19 | Disposition: A | Discharge status: Home / Self Care | Payer: MEDICAID | Attending: Emergency Medicine | Admitting: Emergency Medicine

## 2023-08-19 DIAGNOSIS — N898 Other specified noninflammatory disorders of vagina: Secondary | ICD-10-CM | POA: Insufficient documentation

## 2023-08-19 DIAGNOSIS — R300 Dysuria: Secondary | ICD-10-CM | POA: Diagnosis present

## 2023-08-19 DIAGNOSIS — Z113 Encounter for screening for infections with a predominantly sexual mode of transmission: Secondary | ICD-10-CM | POA: Diagnosis present

## 2023-08-19 LAB — URINALYSIS, W/ REFLEX TO CULTURE (INFECTION SUSPECTED)
Bilirubin Urine: NEGATIVE
Glucose, UA: NEGATIVE mg/dL
Hgb urine dipstick: NEGATIVE
Ketones, ur: NEGATIVE mg/dL
Leukocytes,Ua: NEGATIVE
Nitrite: NEGATIVE
Protein, ur: NEGATIVE mg/dL
Specific Gravity, Urine: 1.02 (ref 1.005–1.030)
pH: 8.5 — ABNORMAL HIGH (ref 5.0–8.0)

## 2023-08-19 LAB — WET PREP, GENITAL
Clue Cells Wet Prep HPF POC: NONE SEEN
Sperm: NONE SEEN
Trich, Wet Prep: NONE SEEN
WBC, Wet Prep HPF POC: <10 (ref <10)
Yeast Wet Prep HPF POC: NONE SEEN

## 2023-08-19 LAB — PREGNANCY, URINE: Preg Test, Ur: NEGATIVE

## 2023-08-19 MED ORDER — PHENAZOPYRIDINE HCL 200 MG PO TABS: 200.0000 mg | ORAL_TABLET | Freq: Three times a day (TID) | ORAL | 0 refills | Status: DC | PRN

## 2023-08-19 NOTE — Discharge Instructions (Signed)
I am sending you home with Pyridium today to help with your urinary symptoms.  Continue drinking plenty of extra fluids.  We will contact you if your urine culture, pregnancy, gonorrhea chlamydia test comes back positive and we will base further treatment off of that.  Here is a list of primary care providers who are taking new patients:  [facility] [place] Dr. Joseph Berkshire (sports medicine) Dr. Elizabeth Sauer [address] [place] [address] [phone]  [facility] at [facility] [address] [place], [place] [address] [phone]  [facility] [place] [address]  [place] [address]  [phone]  [facility] [address] [place], [place] [address] [phone]  [facility] [address] [place]  [phone] [place], [place] [address]  Here are clinics/ other resources who will see you if you do not have insurance. Some have certain criteria that you must meet. Call them and find out what they are:  [facility]: [address], [place], [place] [address] Phone: [phone] Hours: First and Third Saturdays of each Month, 9 a.m. - 1 p.m.  [facility]: [address], [address] [place], [place] [address] Phone: [phone] Hours: Tuesday, 4 p.m. - 8 p.m. Thursday, 1 p.m. - 8 p.m. Wednesday, 9 a.m. - [facility] [address] [place], [place] [address] Phone: [phone] Pharmacy Phone Number: [phone] Dental Phone Number: [phone] [facility] Insurance Help: [phone]  Dental Hours: Monday - Thursday, 8 a.m. - 6 p.m.  [facility] [address] [place], [place] [address] Phone: [phone] Pharmacy Phone Number: [phone] [facility] Insurance Help: [phone]  [facility] [address] [place] [place], [place] [address] Phone: [phone] Pharmacy Phone Number: [phone] [facility] Insurance Help: [phone]  [facility] [address] [place], [place] [address] Phone: [phone] [facility] Insurance Help: [phone]   [facility] [address] [place], [place] [address] Phone: [phone]  Go to www.goodrx.com  or www.costplusdrugs.com to look up your medications. This will give you a list of where you can find your prescriptions at the most affordable prices. Or ask the pharmacist what the cash price is, or if they have any other discount programs available to help make your medication more affordable. This can be less expensive than what you would pay with insurance.

## 2023-08-19 NOTE — ED Triage Notes (Signed)
Pt dx with BV and yeast 1 mth ago.  Mom wants to be retested for STDs. Pt cont to c/o discharge and no odor.

## 2023-08-19 NOTE — ED Provider Notes (Signed)
HPI  SUBJECTIVE:  Kristie Thornton is a 32 y.o. female who presents with thick, odorous vaginal discharge for the past 3 days, dysuria, urgency, cloudy urine.  No vaginal itching, genital rash, abnormal vaginal bleeding.  No fevers, nausea, vomiting, abdominal, pelvic, new or different back pain.  No urinary frequency, odorous urine or hematuria she had unprotected intercourse with a new female partner, who is asymptomatic.  She denies having other partners.  She would like STD testing.  No aggravating or alleviating factors.  She has not tried anything for her symptoms.  She has a past medical history of UTI, chlamydia x 2, frequent BV and yeast infections.  No history of pyelonephritis, nephrolithiasis, gonorrhea, HIV, HSV, syphilis, trichomonas, diabetes.  LMP: 9/15.  She states that she has been regular and denies the possibility of being pregnant.  PCP: In [place].  Would like to establish care with a PCP locally.    Past Medical History:  Diagnosis Date   Abscess    sub areolar left breast   Anxiety    Back pain    Carpal tunnel syndrome during pregnancy 06/03/2017   Chlamydia infection affecting pregnancy, antepartum 03/26/2017   April 2018- POS [x]  TOC neg   Depression    Eczema    Endometriosis    Gestational hypertension 10/08/2017   History of IBS    Ovarian cyst    PTSD (post-traumatic stress disorder)    Suicide and self-inflicted injury (HCC)    when a teenager   Vacuum extractor delivery, delivered 10/06/2017    Past Surgical History:  Procedure Laterality Date   NO PAST SURGERIES      Family History  Problem Relation Age of Onset   Allergic rhinitis Mother    Cancer Mother        colon cancer   Colon polyps Mother    Allergic rhinitis Father    Stroke Father    Cancer Father 36       testicular cancer   Liver disease Sister    Fibromyalgia Sister    Pancreatic disease Sister    ADD / ADHD Sister    ADD / ADHD Sister    ADD / ADHD Brother    Colon cancer Neg  Hx    Esophageal cancer Neg Hx    Inflammatory bowel disease Neg Hx    Pancreatic cancer Neg Hx    Rectal cancer Neg Hx    Stomach cancer Neg Hx     Social History   Tobacco Use   Smoking status: Former    Types: Cigarettes    Passive exposure: Past   Smokeless tobacco: Never   Tobacco comments:    stopped 02/2017 previously vaped  Vaping Use   Vaping status: Every Day  Substance Use Topics   Alcohol use: No    Alcohol/week: 0.0 standard drinks of alcohol    Comment: stopped 01/2017 occasional   Drug use: No    Types: Marijuana    Comment: 03/12/17 last week ago, trying to stop    No current facility-administered medications for this encounter.  Current Outpatient Medications:    gabapentin (NEURONTIN) 600 MG tablet, Take 1 tablet (600 mg total) by mouth 3 (three) times daily. Take 1 tablet in the morning, 1 tablet in the afternoon, and 1.5 tablets at bedtime, Disp: 270 tablet, Rfl: 1   phenazopyridine (PYRIDIUM) 200 MG tablet, Take 1 tablet (200 mg total) by mouth 3 (three) times daily as needed for pain., Disp: 6 tablet, Rfl:  0   clonazePAM (KLONOPIN) 0.5 MG tablet, Take 1 tablet (0.5 mg total) by mouth 2 (two) times daily as needed for anxiety., Disp: 60 tablet, Rfl: 0   cromolyn (OPTICROM) 4 % ophthalmic solution, Place 1 drop into both eyes 2 (two) times daily as needed (itching eye)., Disp: 2.5 mL, Rfl: 0   meloxicam (MOBIC) 15 MG tablet, Take 1 tablet (15 mg total) by mouth daily. Do not take with ibuprofen, Disp: 30 tablet, Rfl: 0   montelukast (SINGULAIR) 10 MG tablet, Take 1 tablet (10 mg total) by mouth at bedtime., Disp: 30 tablet, Rfl: 1   Vitamin D, Ergocalciferol, (DRISDOL) 1.25 MG (50000 UNIT) CAPS capsule, Take 1 capsule (50,000 Units total) by mouth every 7 (seven) days., Disp: 12 capsule, Rfl: 0  Allergies  Allergen Reactions   Lexapro [Escitalopram] Other (See Comments)    suicidal   Sertraline Hcl Other (See Comments)    Suicidality     ROS  As  noted in HPI.   Physical Exam  BP 108/80   Pulse 100   Temp 98.7 F (37.1 C)   Resp 18   LMP 07/14/2023 (Approximate)   SpO2 100%   Constitutional: Well developed, well nourished, no acute distress Eyes:  EOMI, conjunctiva normal bilaterally HENT: Normocephalic, atraumatic,mucus membranes moist Respiratory: Normal inspiratory effort Cardiovascular: Normal rate GI: nondistended.  Soft.  Positive suprapubic tenderness.  No lower quadrant, flank tenderness. Back: No CVAT. skin: No rash, skin intact Musculoskeletal: no deformities Neurologic: Alert & oriented x 3, no focal neuro deficits Psychiatric: Speech and behavior appropriate   ED Course   Medications - No data to display  Orders Placed This Encounter  Procedures   Wet prep, genital    Standing Status:   Standing    Number of Occurrences:   1   Urine Culture    Standing Status:   Standing    Number of Occurrences:   1    Order Specific Question:   Indication    Answer:   Dysuria   Urinalysis, w/ Reflex to Culture (Infection Suspected) -Urine, Clean Catch    Standing Status:   Standing    Number of Occurrences:   1    Order Specific Question:   Specimen Source    Answer:   Urine, Clean Catch [76]   Pregnancy, urine    Standing Status:   Standing    Number of Occurrences:   1    Results for orders placed or performed during the hospital encounter of 08/19/23 (from the past 24 hour(s))  Wet prep, genital     Status: None   Collection Time: 08/19/23 11:09 AM   Specimen: PATH Cytology Cervicovaginal Ancillary Only  Result Value Ref Range   Yeast Wet Prep HPF POC NONE SEEN NONE SEEN   Trich, Wet Prep NONE SEEN NONE SEEN   Clue Cells Wet Prep HPF POC NONE SEEN NONE SEEN   WBC, Wet Prep HPF POC <10 <10   Sperm NONE SEEN   Urinalysis, w/ Reflex to Culture (Infection Suspected) -Urine, Clean Catch     Status: Abnormal   Collection Time: 08/19/23 11:21 AM  Result Value Ref Range   Specimen Source URINE, CLEAN  CATCH    Color, Urine STRAW (A) YELLOW   APPearance CLOUDY (A) CLEAR   Specific Gravity, Urine 1.020 1.005 - 1.030   pH 8.5 (H) 5.0 - 8.0   Glucose, UA NEGATIVE NEGATIVE mg/dL   Hgb urine dipstick NEGATIVE NEGATIVE   Bilirubin  Urine NEGATIVE NEGATIVE   Ketones, ur NEGATIVE NEGATIVE mg/dL   Protein, ur NEGATIVE NEGATIVE mg/dL   Nitrite NEGATIVE NEGATIVE   Leukocytes,Ua NEGATIVE NEGATIVE   Squamous Epithelial / HPF 0-5 0 - 5 /HPF   WBC, UA 0-5 0 - 5 WBC/hpf   RBC / HPF 0-5 0 - 5 RBC/hpf   Bacteria, UA FEW (A) NONE SEEN   Amorphous Crystal PRESENT    No results found.  ED Clinical Impression  1. Dysuria   2. Vaginal discharge   3. Screening for STDs (sexually transmitted diseases)      ED Assessment/Plan     UA has a few bacteria, but is negative for UTI.  Will send off for culture given her symptoms and findings of suprapubic tenderness to confirm absence of UTI.  Will send home with Pyridium today, but will defer antibiotic treatment based on culture results.  Urine pregnancy negative.  Wet prep negative.  Gonorrhea, chlamydia pending.  Discussed this with patient while in department.  Will base treatment off of labs.  Will provide primary care list for routine care.  Discussed labs, MDM, treatment plan, and plan for follow-up with patient.. patient agrees with plan.   Meds ordered this encounter  Medications   phenazopyridine (PYRIDIUM) 200 MG tablet    Sig: Take 1 tablet (200 mg total) by mouth 3 (three) times daily as needed for pain.    Dispense:  6 tablet    Refill:  0      *This clinic note was created using Scientist, clinical (histocompatibility and immunogenetics). Therefore, there may be occasional mistakes despite careful proofreading.  ?    Domenick Gong, MD 08/20/23 [phone]

## 2023-08-20 LAB — CERVICOVAGINAL ANCILLARY ONLY
Chlamydia: NEGATIVE
Comment: NEGATIVE
Comment: NEGATIVE
Comment: NORMAL
Neisseria Gonorrhea: NEGATIVE
Trichomonas: NEGATIVE

## 2023-08-20 LAB — URINE CULTURE: Culture: NO GROWTH

## 2023-08-24 ENCOUNTER — Other Ambulatory Visit: Payer: Self-pay | Admitting: Nurse Practitioner

## 2023-08-26 ENCOUNTER — Encounter: Payer: Self-pay | Admitting: Nurse Practitioner

## 2023-08-28 MED ORDER — ALBUTEROL SULFATE HFA 108 (90 BASE) MCG/ACT IN AERS: 2.0000 | INHALATION_SPRAY | Freq: Four times a day (QID) | RESPIRATORY_TRACT | 1 refills | Status: DC | PRN

## 2023-08-28 NOTE — Telephone Encounter (Signed)
Pharmacy closed for a meal break will call them after 2 pm. Dm/cma

## 2023-08-28 NOTE — Addendum Note (Signed)
Addended by: Waymond Cera on: 08/28/2023 03:06 PM   Modules accepted: Orders

## 2023-09-11 ENCOUNTER — Telehealth: Payer: Self-pay

## 2023-09-11 NOTE — Telephone Encounter (Signed)
Prior Kristie Thornton is needed on Albuterol HFA INH 200 puffs 18gm  Please call plan at [phone]. Patient ID# [id number]

## 2023-09-12 ENCOUNTER — Other Ambulatory Visit (HOSPITAL_COMMUNITY): Payer: Self-pay

## 2023-09-15 MED ORDER — ALBUTEROL SULFATE HFA 108 (90 BASE) MCG/ACT IN AERS: 2.0000 | INHALATION_SPRAY | Freq: Four times a day (QID) | RESPIRATORY_TRACT | 1 refills | Status: AC | PRN

## 2023-09-15 NOTE — Telephone Encounter (Signed)
LVM for patient to return call. 

## 2023-09-16 NOTE — Telephone Encounter (Signed)
Patient notified that Rx changed to Ventolin and she has picked up inhaler.

## 2023-11-17 DIAGNOSIS — T7411XA Adult physical abuse, confirmed, initial encounter: Secondary | ICD-10-CM | POA: Insufficient documentation

## 2023-11-17 DIAGNOSIS — T7402XD Child neglect or abandonment, confirmed, subsequent encounter: Secondary | ICD-10-CM | POA: Insufficient documentation

## 2023-11-18 ENCOUNTER — Ambulatory Visit (INDEPENDENT_AMBULATORY_CARE_PROVIDER_SITE_OTHER): Payer: MEDICAID | Admitting: Family Medicine

## 2023-11-18 ENCOUNTER — Encounter: Payer: Self-pay | Admitting: Family Medicine

## 2023-11-18 VITALS — BP 113/73 | HR 95 | Temp 97.9°F | Resp 14 | Ht <= 58 in | Wt 132.6 lb

## 2023-11-18 DIAGNOSIS — Z30018 Encounter for initial prescription of other contraceptives: Secondary | ICD-10-CM

## 2023-11-18 DIAGNOSIS — N898 Other specified noninflammatory disorders of vagina: Secondary | ICD-10-CM | POA: Insufficient documentation

## 2023-11-18 LAB — POCT URINE PREGNANCY: Preg Test, Ur: NEGATIVE

## 2023-11-18 MED ORDER — NORETHINDRONE ACET-ETHINYL EST 1.5-30 MG-MCG PO TABS
1.0000 | ORAL_TABLET | Freq: Every day | ORAL | 11 refills | Status: DC
Start: 2023-11-18 — End: 2024-02-19

## 2023-11-18 NOTE — Progress Notes (Signed)
Established Patient Office Visit  Subjective   Patient ID: Kristie Thornton, female    DOB: 01-Jul-1991  Age: 33 y.o. MRN: [id number]  Chief Complaint  Patient presents with   Establish Care    HPI Kristie Thornton doing fine.  She got a couple issues.  She also get back on birth control but is opposed to getting on Depo-Provera.  It took 2 years for her periods to return to normal after stopping Depo.  She is not interested in an Implanon or an IUD.  She would be willing to use OCPs.  Her periods are 28 to 35 days apart.  She like to be screened for STDs as well as BV.  Last Pap was last year and it was normal per her.  She does not smoke and she is 32. She is off gabapentin and clonazepam and she is doing fine with her anxiety.  She smokes pot if she feels anxious.  This is not an every day occurrence. She complains of low back pain at work.  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  She does not take anything for this.    ROS    Objective:     BP 113/73 (BP Location: Right Arm, Patient Position: Sitting, Cuff Size: Normal)   Pulse 95   Temp 97.9 F (36.6 C) (Oral)   Resp 14   Ht (!) 5" (0.127 m) Comment: per patient  Wt 132 lb 9.6 oz (60.1 kg)   SpO2 94%   BMI 3729.12 kg/m    Physical Exam Vitals and nursing note reviewed.  Constitutional:      Appearance: Normal appearance.  HENT:     Head: Normocephalic and atraumatic.     Right Ear: Tympanic membrane, ear canal and external ear normal.     Left Ear: Tympanic membrane, ear canal and external ear normal.     Nose: Nose normal.     Mouth/Throat:     Pharynx: Oropharynx is clear.  Eyes:     Extraocular Movements: Extraocular movements intact.     Conjunctiva/sclera: Conjunctivae normal.     Pupils: Pupils are equal, round, and reactive to light.  Neck:     Thyroid: No thyromegaly.  Cardiovascular:     Rate and Rhythm: Normal rate and regular rhythm.  Pulmonary:     Effort: Pulmonary effort is  normal.     Breath sounds: Normal breath sounds.  Abdominal:     General: Abdomen is flat. Bowel sounds are normal.     Palpations: Abdomen is soft.     Tenderness: There is no abdominal tenderness.  Musculoskeletal:        General: No swelling.     Cervical back: Neck supple.     Right lower leg: No edema.     Left lower leg: No edema.  Skin:    General: Skin is warm and dry.  Neurological:     Mental Status: She is alert and oriented to person, place, and time.  Psychiatric:        Mood and Affect: Mood normal.        Behavior: Behavior normal.        Thought Content: Thought content normal.        Judgment: Judgment normal.          Results for orders placed or performed in visit on 11/18/23  POCT urine pregnancy  Result Value Ref Range   Preg Test, Ur  Negative Negative      The ASCVD Risk score (Arnett DK, et al., 2019) failed to calculate for the following reasons:   The 2019 ASCVD risk score is only valid for ages 1 to 97    Assessment & Plan:  Vaginal discharge Assessment & Plan: Patient requested screening for BV and STDs.  Orders: -     NuSwab Vaginitis Plus (VG+)  Encounter for initial prescription of other contraceptives Assessment & Plan: Will start her on Junel 20/1.5.  She got a negative pregnancy test.  Will see her back in a month to check her blood pressure.  Orders: -     POCT urine pregnancy -     Norethindrone Acet-Ethinyl Est; Take 1 tablet by mouth daily.  Dispense: 28 tablet; Refill: 11     Return in about 4 weeks (around 12/16/2023).    Alease Medina, MD

## 2023-11-18 NOTE — Assessment & Plan Note (Signed)
Will start her on Junel 20/1.5.  She got a negative pregnancy test.  Will see her back in a month to check her blood pressure.

## 2023-11-18 NOTE — Assessment & Plan Note (Signed)
Patient requested screening for BV and STDs.

## 2023-11-21 ENCOUNTER — Other Ambulatory Visit: Payer: Self-pay | Admitting: Family Medicine

## 2023-11-21 ENCOUNTER — Encounter: Payer: Self-pay | Admitting: Family Medicine

## 2023-11-21 DIAGNOSIS — B9689 Other specified bacterial agents as the cause of diseases classified elsewhere: Secondary | ICD-10-CM

## 2023-11-21 LAB — NUSWAB BV AND CANDIDA, NAA
Atopobium vaginae: HIGH {score} — AB
Candida albicans, NAA: NEGATIVE
Candida glabrata, NAA: NEGATIVE

## 2023-11-21 LAB — NUSWAB VAGINITIS PLUS (VG+)
Atopobium vaginae: HIGH {score} — AB
Candida albicans, NAA: NEGATIVE
Candida glabrata, NAA: NEGATIVE
Chlamydia trachomatis, NAA: NEGATIVE
Neisseria gonorrhoeae, NAA: NEGATIVE
Trich vag by NAA: NEGATIVE

## 2023-11-21 MED ORDER — METRONIDAZOLE 500 MG PO TABS
500.0000 mg | ORAL_TABLET | Freq: Two times a day (BID) | ORAL | 0 refills | Status: AC
Start: 2023-11-21 — End: 2023-11-28

## 2023-12-16 ENCOUNTER — Ambulatory Visit: Payer: MEDICAID | Admitting: Family Medicine

## 2023-12-19 ENCOUNTER — Encounter: Payer: Self-pay | Admitting: Family Medicine

## 2023-12-19 ENCOUNTER — Ambulatory Visit (INDEPENDENT_AMBULATORY_CARE_PROVIDER_SITE_OTHER): Payer: MEDICAID | Admitting: Family Medicine

## 2023-12-19 VITALS — BP 114/83 | HR 100 | Ht 60.0 in | Wt 120.0 lb

## 2023-12-19 DIAGNOSIS — R300 Dysuria: Secondary | ICD-10-CM | POA: Diagnosis not present

## 2023-12-19 DIAGNOSIS — N3000 Acute cystitis without hematuria: Secondary | ICD-10-CM

## 2023-12-19 DIAGNOSIS — N760 Acute vaginitis: Secondary | ICD-10-CM | POA: Diagnosis not present

## 2023-12-19 DIAGNOSIS — B9689 Other specified bacterial agents as the cause of diseases classified elsewhere: Secondary | ICD-10-CM

## 2023-12-19 DIAGNOSIS — R82998 Other abnormal findings in urine: Secondary | ICD-10-CM

## 2023-12-19 DIAGNOSIS — N390 Urinary tract infection, site not specified: Secondary | ICD-10-CM | POA: Insufficient documentation

## 2023-12-19 LAB — POCT URINALYSIS DIP (CLINITEK)
Glucose, UA: NEGATIVE mg/dL
Nitrite, UA: NEGATIVE
POC PROTEIN,UA: 100 — AB
Spec Grav, UA: 1.025 (ref 1.010–1.025)
Urobilinogen, UA: 1 U/dL
pH, UA: 6.5 (ref 5.0–8.0)

## 2023-12-19 MED ORDER — NITROFURANTOIN MONOHYD MACRO 100 MG PO CAPS: 100.0000 mg | ORAL_CAPSULE | Freq: Two times a day (BID) | ORAL | 0 refills | Status: DC

## 2023-12-19 NOTE — Assessment & Plan Note (Signed)
 Is symptomatic for UTI and has small leukocytes.  Will culture.  Prophylactic antibiotics with nitrofurantoin twice daily for 3 days.

## 2023-12-19 NOTE — Assessment & Plan Note (Signed)
 Had her self collect an Aptima swab.  Will test for cure as she denies signs and symptoms of BV.

## 2023-12-19 NOTE — Addendum Note (Signed)
 Addended by: Carleene Mains A on: 12/19/2023 11:57 AM   Modules accepted: Orders

## 2023-12-19 NOTE — Progress Notes (Signed)
    Established Patient Office Visit  Subjective   Patient ID: Kristie Thornton, female    DOB: 11/09/1990  Age: 33 y.o. MRN: [id number]  Chief Complaint  Patient presents with   Urinary Tract Infection    UTI and check on BV    Urinary Tract Infection     32-yo with hx of BV on OCPs.  Started Junel 1.5/20 last month.  Having no difficulty taking this.  No nausea.  No breakthrough bleeding.   She has had 3 days of urgency to void, foul odor, cloudiness.  She has had no fever, back pain, nausea or vomiting.  No history of pyelonephritis. She is also concerned she may have BV again. She denies vaginal discharge that is different or an odor.      ROS    Objective:     BP 114/83 (BP Location: Left Arm, Patient Position: Sitting, Cuff Size: Normal)   Pulse 100   Ht 5' (1.524 m)   Wt 120 lb (54.4 kg)   SpO2 98%   BMI 23.44 kg/m    Physical Exam Vitals and nursing note reviewed.  Constitutional:      Appearance: Normal appearance.  HENT:     Head: Normocephalic and atraumatic.  Eyes:     Conjunctiva/sclera: Conjunctivae normal.  Cardiovascular:     Rate and Rhythm: Normal rate and regular rhythm.  Pulmonary:     Effort: Pulmonary effort is normal.     Breath sounds: Normal breath sounds.  Abdominal:     General: Bowel sounds are normal.     Palpations: Abdomen is soft.     Tenderness: There is no abdominal tenderness. There is no right CVA tenderness or left CVA tenderness.  Musculoskeletal:     Right lower leg: No edema.     Left lower leg: No edema.  Skin:    General: Skin is warm and dry.  Neurological:     Mental Status: She is alert and oriented to person, place, and time.  Psychiatric:        Mood and Affect: Mood normal.        Behavior: Behavior normal.        Thought Content: Thought content normal.        Judgment: Judgment normal.      No results found for any visits on 12/19/23.    The ASCVD Risk score (Arnett DK, et al., 2019) failed to calculate  for the following reasons:   The 2019 ASCVD risk score is only valid for ages 7 to 73    Assessment & Plan:   Problem List Items Addressed This Visit       Genitourinary   BV (bacterial vaginosis) - Primary   Had her self collect an Aptima swab.  Will test for cure as she denies signs and symptoms of BV.        Relevant Orders   NuSwab Vaginitis Plus (VG+)   Urine Culture   UTI (urinary tract infection)   Is symptomatic for UTI and has small leukocytes.  Will culture.  Prophylactic antibiotics with nitrofurantoin twice daily for 3 days.      Other Visit Diagnoses       Leukocytes in urine       Relevant Orders   Urine Culture       No follow-ups on file.    Alease Medina, MD

## 2023-12-21 ENCOUNTER — Other Ambulatory Visit: Payer: Self-pay | Admitting: Nurse Practitioner

## 2023-12-21 LAB — NUSWAB VAGINITIS PLUS (VG+)
Candida albicans, NAA: NEGATIVE
Candida glabrata, NAA: NEGATIVE
Chlamydia trachomatis, NAA: NEGATIVE
Neisseria gonorrhoeae, NAA: NEGATIVE
Trich vag by NAA: NEGATIVE

## 2023-12-22 ENCOUNTER — Encounter: Payer: Self-pay | Admitting: Family Medicine

## 2023-12-22 ENCOUNTER — Ambulatory Visit: Payer: Self-pay | Admitting: Family Medicine

## 2023-12-22 LAB — URINE CULTURE

## 2023-12-22 NOTE — Telephone Encounter (Signed)
 Additional Notes: Patient called and advised that Dr Girtha Rm sent in [facility] in to the pharmacy and patient states that the pharmacy told her that this prescription will make her symptoms worse.  She states that she has a UTI and BV and would like Flagyl Cream sent in to her pharmacy because she prefers this.  She said that if she can get an extra one sent in for her to have on hand in the future. Patient has sent messages through MyChart and would like a response.  Patient also wanted to know why she had information on her chart that was incorrect and she requested that it be taken off of her chart and it hasn't yet due to be put in there by a different provider but patient has been unable to obtain information about who this provider is.  Patient would like information on who this provider is.  Patient states that her symptoms have gotten worse over the weekend and would like the new prescription of the Flagyl Cream sent in.  Patient is calm and cooperative but also frustrated with trying to get the prescriptions and with the information put in her chart. Patient is also advised that if any of her symptoms get worse to go to the emergency room and she stated that she understands and she will go to urgent care if things get worse.   Reason for Disposition  [1] Caller requesting NON-URGENT health information AND [2] PCP's office is the best resource  Answer Assessment - Initial Assessment Questions 1. REASON FOR CALL or QUESTION: "What is your reason for calling today?" or "How can I best help you?" or "What question do you have that I can help answer?"     Patient needs prescription for Flagyl Cream and also wants someone to follow up with her about information in her chart put in by a different provider  Protocols used: Information Only Call - No Triage-A-AH

## 2024-01-06 ENCOUNTER — Ambulatory Visit (INDEPENDENT_AMBULATORY_CARE_PROVIDER_SITE_OTHER): Payer: MEDICAID | Admitting: Family Medicine

## 2024-01-06 VITALS — BP 124/89 | HR 116 | Temp 100.6°F | Ht 60.0 in | Wt 125.0 lb

## 2024-01-06 DIAGNOSIS — J069 Acute upper respiratory infection, unspecified: Secondary | ICD-10-CM

## 2024-01-06 DIAGNOSIS — J101 Influenza due to other identified influenza virus with other respiratory manifestations: Secondary | ICD-10-CM | POA: Insufficient documentation

## 2024-01-06 DIAGNOSIS — J208 Acute bronchitis due to other specified organisms: Secondary | ICD-10-CM

## 2024-01-06 LAB — POC COVID19/FLU A&B COMBO
Covid Antigen, POC: NEGATIVE
Influenza A Antigen, POC: POSITIVE — AB
Influenza B Antigen, POC: NEGATIVE

## 2024-01-06 MED ORDER — BENZONATATE 200 MG PO CAPS
200.0000 mg | ORAL_CAPSULE | Freq: Three times a day (TID) | ORAL | 0 refills | Status: DC | PRN
Start: 2024-01-06 — End: 2024-02-19

## 2024-01-06 MED ORDER — HYDROCOD POLI-CHLORPHE POLI ER 10-8 MG/5ML PO SUER
5.0000 mL | Freq: Every evening | ORAL | 0 refills | Status: DC | PRN
Start: 2024-01-06 — End: 2024-02-19

## 2024-01-06 MED ORDER — OSELTAMIVIR PHOSPHATE 75 MG PO CAPS: 75.0000 mg | ORAL_CAPSULE | Freq: Two times a day (BID) | ORAL | 0 refills | Status: DC

## 2024-01-06 NOTE — Assessment & Plan Note (Signed)
 Do not go to work until you are not running a fever.  Increase rest and fluids.  Use albuterol every 4 to 6 hours as needed for wheezing.  If you develop shortness of breath, chest pain, increased cough, nausea or vomiting or cannot keep fluids down please return to the clinic or go to the ED.

## 2024-01-06 NOTE — Progress Notes (Signed)
 Established Patient Office Visit  Subjective   Patient ID: Kristie Thornton, female    DOB: 1990-11-30  Age: 33 y.o. MRN: [id number]  Chief Complaint  Patient presents with   URI   Otalgia    HPIDelightful 33 yo with viral respiratory symptoms for 2-3 days.  She has been running a fever of 102 and has myalgias, rhinorrhea and headache.  She reports she has bronchitis and a cough.  She is wheezing.  She has been using albuterol inhaler.  She denies that she is short of breath with ambulation.  She is asking for Tussionex and Jerilynn Som because this works best for her when she has bronchitis.  She takes the Tussionex at night to sleep and the Tessalon during the day. No sick contacts.     ROS    Objective:     BP 124/89 (BP Location: Left Arm, Patient Position: Sitting, Cuff Size: Normal)   Pulse (!) 116   Temp (!) 100.6 F (38.1 C) (Oral)   Ht 5' (1.524 m)   Wt 125 lb (56.7 kg)   SpO2 96%   BMI 24.41 kg/m    Physical Exam Vitals and nursing note reviewed.  Constitutional:      Appearance: Normal appearance.  HENT:     Head: Normocephalic and atraumatic.  Eyes:     Conjunctiva/sclera: Conjunctivae normal.  Cardiovascular:     Rate and Rhythm: Normal rate and regular rhythm.  Pulmonary:     Effort: Pulmonary effort is normal. No respiratory distress.     Breath sounds: Wheezing (End expiratory wheezing to forced expiration) and rhonchi present. No rales.  Musculoskeletal:     Right lower leg: No edema.     Left lower leg: No edema.  Skin:    General: Skin is warm and dry.  Neurological:     Mental Status: She is alert and oriented to person, place, and time.  Psychiatric:        Mood and Affect: Mood normal.        Behavior: Behavior normal.        Thought Content: Thought content normal.        Judgment: Judgment normal.          Results for orders placed or performed in visit on 01/06/24  POC Covid19/Flu A&B Antigen  Result Value Ref Range   Influenza  A Antigen, POC Positive (A) Negative   Influenza B Antigen, POC Negative Negative   Covid Antigen, POC Negative Negative      The ASCVD Risk score (Arnett DK, et al., 2019) failed to calculate for the following reasons:   The 2019 ASCVD risk score is only valid for ages 67 to 75    Assessment & Plan:  Upper respiratory tract infection, unspecified type -     POC Covid19/Flu A&B Antigen  Acute viral bronchitis  Influenza A Assessment & Plan: Do not go to work until you are not running a fever.  Increase rest and fluids.  Use albuterol every 4 to 6 hours as needed for wheezing.  If you develop shortness of breath, chest pain, increased cough, nausea or vomiting or cannot keep fluids down please return to the clinic or go to the ED.  Orders: -     Oseltamivir Phosphate; Take 1 capsule (75 mg total) by mouth 2 (two) times daily.  Dispense: 10 capsule; Refill: 0 -     Benzonatate; Take 1 capsule (200 mg total) by mouth 3 (three) times daily  as needed for cough.  Dispense: 30 capsule; Refill: 0 -     Hydrocod Poli-Chlorphe Poli ER; Take 5 mLs by mouth at bedtime as needed for cough (cough, will cause drowsiness.).  Dispense: 115 mL; Refill: 0     Return if symptoms worsen or fail to improve.    Alease Medina, MD

## 2024-02-19 ENCOUNTER — Ambulatory Visit (INDEPENDENT_AMBULATORY_CARE_PROVIDER_SITE_OTHER): Payer: MEDICAID | Admitting: Family Medicine

## 2024-02-19 ENCOUNTER — Other Ambulatory Visit: Payer: Self-pay | Admitting: Family Medicine

## 2024-02-19 ENCOUNTER — Encounter: Payer: Self-pay | Admitting: Family Medicine

## 2024-02-19 VITALS — BP 101/71 | HR 91 | Ht 60.0 in | Wt 123.6 lb

## 2024-02-19 DIAGNOSIS — R300 Dysuria: Secondary | ICD-10-CM | POA: Diagnosis not present

## 2024-02-19 DIAGNOSIS — N898 Other specified noninflammatory disorders of vagina: Secondary | ICD-10-CM

## 2024-02-19 DIAGNOSIS — B9689 Other specified bacterial agents as the cause of diseases classified elsewhere: Secondary | ICD-10-CM

## 2024-02-19 LAB — POCT URINALYSIS DIP (CLINITEK)
Bilirubin, UA: NEGATIVE
Blood, UA: NEGATIVE
Glucose, UA: NEGATIVE mg/dL
Ketones, POC UA: NEGATIVE mg/dL
Leukocytes, UA: NEGATIVE
Nitrite, UA: NEGATIVE
POC PROTEIN,UA: NEGATIVE
Spec Grav, UA: 1.025 (ref 1.010–1.025)
Urobilinogen, UA: 0.2 U/dL
pH, UA: 6.5 (ref 5.0–8.0)

## 2024-02-19 NOTE — Progress Notes (Signed)
   Established Patient Office Visit  Subjective   Patient ID: Kristie Thornton, female    DOB: [birth year]  Age: 33 y.o. MRN: [id number]  Chief Complaint  Patient presents with   Follow-up    Possible BV , Sunday cloudy urination, bloating     HPI 33 yo with anxiety and chronic lumbago who feels she may have BV.  Symptoms started on Sunday with cloudy urine and an uncomfortable feeling when she voids.  She denies dysuria, frequency, suprapubic pain, hematuria, dyspareunia.  She denies an itch or an odor.  No fever, abdominal pain and scant vaginal discharge. She is  on Junel 20/1.5.    ROS    Objective:     BP 101/71 (BP Location: Left Arm, Patient Position: Sitting)   Pulse 91   Ht 5' (1.524 m)   Wt 123 lb 9.6 oz (56.1 kg)   SpO2 92%   BMI 24.14 kg/m    Physical Exam Vitals reviewed.  Constitutional:      Appearance: Normal appearance.  HENT:     Head: Normocephalic.  Eyes:     General:        Right eye: No discharge.        Left eye: No discharge.  Cardiovascular:     Rate and Rhythm: Normal rate.  Pulmonary:     Effort: Pulmonary effort is normal.  Neurological:     Mental Status: She is alert and oriented to person, place, and time.  Psychiatric:        Mood and Affect: Mood normal.        Behavior: Behavior normal.        Thought Content: Thought content normal.        Judgment: Judgment normal.          Results for orders placed or performed in visit on 02/19/24  POCT URINALYSIS DIP (CLINITEK)  Result Value Ref Range   Color, UA other (A) yellow   Clarity, UA cloudy (A) clear   Glucose, UA negative negative mg/dL   Bilirubin, UA negative negative   Ketones, POC UA negative negative mg/dL   Spec Grav, UA 2.130 8.657 - 1.025   Blood, UA negative negative   pH, UA 6.5 5.0 - 8.0   POC PROTEIN,UA negative negative, trace   Urobilinogen, UA 0.2 0.2 or 1.0 E.U./dL   Nitrite, UA Negative Negative   Leukocytes, UA Negative Negative      The ASCVD Risk  score (Arnett DK, et al., 2019) failed to calculate for the following reasons:   The 2019 ASCVD risk score is only valid for ages 79 to 63    Assessment & Plan:  BV (bacterial vaginosis) -     NuSwab Vaginitis Plus (VG+) -     POCT URINALYSIS DIP (CLINITEK)  Vaginal discharge Assessment & Plan: Feels uncomfortable, no odor or itch.  Mild dysuria.  UA normal.   Had patient self collect a vaginal probe.   Sending out NuSwab for GC, trich, chlamydia, BV and yeast.  No empiric treatment.  Will wait on the vaginal probe results   Dysuria Assessment & Plan: Urine sample was normal.  Leukocyte esterase and Nitrate were both negative.  Specific gravity was 1.025 so concentrated urine may be causing the discomfort.        Return if symptoms worsen or fail to improve.    Rydell Wiegel K Maceo Hernan, MD

## 2024-02-19 NOTE — Assessment & Plan Note (Addendum)
 Feels uncomfortable, no odor or itch.  Mild dysuria.  UA normal.   Had patient self collect a vaginal probe.   Sending out NuSwab for GC, trich, chlamydia, BV and yeast.  No empiric treatment.  Will wait on the vaginal probe results

## 2024-02-19 NOTE — Assessment & Plan Note (Signed)
 Urine sample was normal.  Leukocyte esterase and Nitrate were both negative.  Specific gravity was 1.025 so concentrated urine may be causing the discomfort.

## 2024-02-21 LAB — NUSWAB VAGINITIS PLUS (VG+)
Candida albicans, NAA: NEGATIVE
Candida glabrata, NAA: NEGATIVE
Chlamydia trachomatis, NAA: NEGATIVE
Neisseria gonorrhoeae, NAA: NEGATIVE
Trich vag by NAA: NEGATIVE

## 2024-02-23 ENCOUNTER — Encounter: Payer: Self-pay | Admitting: Family Medicine

## 2024-03-15 ENCOUNTER — Encounter: Payer: Self-pay | Admitting: Family Medicine

## 2024-03-15 ENCOUNTER — Other Ambulatory Visit: Payer: Self-pay | Admitting: Family Medicine

## 2024-03-15 DIAGNOSIS — T7840XD Allergy, unspecified, subsequent encounter: Secondary | ICD-10-CM

## 2024-03-15 MED ORDER — CROMOLYN SODIUM 4 % OP SOLN: 1.0000 [drp] | Freq: Two times a day (BID) | OPHTHALMIC | 3 refills | Status: AC

## 2024-03-24 ENCOUNTER — Other Ambulatory Visit: Payer: Self-pay | Admitting: Nurse Practitioner

## 2024-04-23 ENCOUNTER — Ambulatory Visit: Payer: Self-pay

## 2024-04-23 NOTE — Telephone Encounter (Signed)
 FYI Only or Action Required?: Action required by provider: request for appointment and Pt wants to come in today for a depo shot..  Patient was last seen in primary care on 02/19/2024 by Ziglar, Devere POUR, MD. Called Nurse Triage reporting Bloated. Feels like she is 3 months pregnant.Symptoms began 4 days agoseveral days ago. Interventions attempted: OTC medications: has tried medication for gas and constipation. Normal BM. Symptoms are: unchanged.  Triage Disposition: See HCP Within 4 Hours (Or PCP Triage)  Patient/caregiver understands and will follow disposition?: Yes                 Extremely bloated unsure why.. feels 3 mos pregnant.. but urine and bowel are fine.. struggling   Reason for Disposition  [1] MILD-MODERATE abdomen pain AND [2] constant AND [3] present > 2 hours  Answer Assessment - Initial Assessment Questions 1. SYMPTOM: What's the main symptom you're concerned about? (e.g., abdomen bloating, swelling)     bloated 2. ONSET: When did bloating  start?     4 days 3. SEVERITY: How bad is the bloating or swelling?    - BLOATING: Feels gassy or bloated. No visible swelling.     - MILD SWELLING: Feels gassy or bloated. Abdomen looks mildly distended or swollen.    - MODERATE - SEVERE SWELLING: Abdomen looks very distended or swollen.      uncomfortable 4. ABDOMEN PAIN:  Is there any abdomen pain? If Yes, ask: How bad is the pain?  (e.g., Scale 1-10; mild, moderate, or severe)   - NONE (0): No pain.   - MILD (1-3): Doesn't interfere with normal activities, abdomen soft and not tender to touch.    - MODERATE (4-7): Interferes with normal activities or awakens from sleep, abdomen tender to touch.    - SEVERE (8-10): Excruciating pain, doubled over, unable to do any normal activities.        5. RELIEVING AND AGGRAVATING FACTORS: What makes it better or worse? (e.g., certain foods, lactose, medicines)     nothing 6. GI HISTORY: Do you have any history of  stomach or intestine problems? (e.g., bowel obstruction, cancer, irritable bowel)      no 7. CAUSE: What do you think is causing the bloating?      Birth control pills, Sesame oil intolerance 8. OTHER SYMPTOMS: Do you have any other symptoms? (e.g., belching, blood in stool, breathing difficulty, constipation, diarrhea, fever, passing gas, vomiting, weight loss, white of eyes have turned yellow)     No - stools normal 9. PREGNANCY: Is there any chance you are pregnant? When was your last menstrual period?     no  Protocols used: Abdomen Bloating and Swelling-A-AH

## 2024-04-25 ENCOUNTER — Ambulatory Visit: Payer: Self-pay | Admitting: Advanced Practice Midwife

## 2024-04-26 ENCOUNTER — Ambulatory Visit: Payer: MEDICAID | Admitting: Family Medicine

## 2024-06-24 ENCOUNTER — Ambulatory Visit (INDEPENDENT_AMBULATORY_CARE_PROVIDER_SITE_OTHER): Payer: MEDICAID | Admitting: Family Medicine

## 2024-06-24 ENCOUNTER — Ambulatory Visit: Payer: MEDICAID

## 2024-06-24 ENCOUNTER — Encounter: Payer: Self-pay | Admitting: Family Medicine

## 2024-06-24 VITALS — BP 116/82 | HR 88 | Ht 60.0 in | Wt 136.0 lb

## 2024-06-24 DIAGNOSIS — Z30013 Encounter for initial prescription of injectable contraceptive: Secondary | ICD-10-CM | POA: Diagnosis not present

## 2024-06-24 DIAGNOSIS — R140 Abdominal distension (gaseous): Secondary | ICD-10-CM | POA: Diagnosis not present

## 2024-06-24 DIAGNOSIS — N898 Other specified noninflammatory disorders of vagina: Secondary | ICD-10-CM

## 2024-06-24 DIAGNOSIS — Z0000 Encounter for general adult medical examination without abnormal findings: Secondary | ICD-10-CM

## 2024-06-24 DIAGNOSIS — L650 Telogen effluvium: Secondary | ICD-10-CM | POA: Diagnosis not present

## 2024-06-24 LAB — POCT URINE PREGNANCY: Preg Test, Ur: NEGATIVE

## 2024-06-24 MED ORDER — MEDROXYPROGESTERONE ACETATE 150 MG/ML IM SUSP: 150.0000 mg | INTRAMUSCULAR | 3 refills | Status: AC

## 2024-06-24 MED ORDER — MEDROXYPROGESTERONE ACETATE 150 MG/ML IM SUSP
150.0000 mg | Freq: Once | INTRAMUSCULAR | Status: AC
  Administered 2024-06-24: 150 mg via INTRAMUSCULAR

## 2024-06-24 NOTE — Progress Notes (Signed)
 Established Patient Office Visit  Subjective   Patient ID: Kristie Thornton, female    DOB: 10/27/1991  Age: 33 y.o. MRN: [id number]  Chief Complaint  Patient presents with   Annual Exam    Pt is here today for her physical. States she has been having abdominal pain on the left side which is of concern. Also has had some vaginal itching so wants to be checked to see if it could be BV.    HPI 33 yo with anxiety and chronic lumbago who feels she may have BV.      She has a couple things that she is concerned about.  Her hair is coming out in droves.  Has been going on now for about 3 months.  She was on oral contraceptives and was having a period every 90 days.  She decided to get back on Depo-Provera  because she has no periods at all when she is on Depo.  She stopped the COCs in May.  Advised that the change in her contraception may be causing the telogen effluvium.     She complains of the feeling of bloating.  She saw an OB/GYN and had a transvaginal ultrasound last May.  She still feels bloated in her abdomen and face often.  She has pain in the left upper quadrant when she is constipated.  Her bowels move about every day and she describes that they are not hard.  She was scheduled for colonoscopy but did not follow-up.  Request to see gastroenterology for colonoscopy now.      She is concerned that she may have BV because she has some itching.  She denies abnormal discharge and dysuria. Reports no fever and abdominal pain      She does do self breast exams and has nothing that she is concerned about.      She has not had her HPV vaccines but she is willing to get these.  ROS    Objective:     BP 116/82 (BP Location: Left Arm, Patient Position: Sitting, Cuff Size: Normal)   Pulse 88   Ht 5' (1.524 m)   Wt 136 lb (61.7 kg)   LMP 05/30/2024 (Approximate)   SpO2 99%   BMI 26.56 kg/m    Physical Exam Vitals and nursing note reviewed. Exam conducted with a chaperone present.   Constitutional:      Appearance: Normal appearance.  HENT:     Head: Normocephalic and atraumatic.  Eyes:     Conjunctiva/sclera: Conjunctivae normal.  Cardiovascular:     Rate and Rhythm: Normal rate and regular rhythm.  Pulmonary:     Effort: Pulmonary effort is normal.     Breath sounds: Normal breath sounds.  Chest:     Chest wall: No mass.  Breasts:    Right: Normal. No mass, nipple discharge or skin change.     Left: Normal. No mass, nipple discharge or skin change.  Abdominal:     General: Abdomen is flat. Bowel sounds are normal. There is no distension.     Palpations: Abdomen is soft. There is no hepatomegaly or splenomegaly.     Tenderness: There is no abdominal tenderness.  Musculoskeletal:     Right lower leg: No edema.     Left lower leg: No edema.  Lymphadenopathy:     Upper Body:     Right upper body: No supraclavicular, axillary or pectoral adenopathy.     Left upper body: No supraclavicular, axillary or pectoral adenopathy.  Skin:    General: Skin is warm and dry.  Neurological:     Mental Status: She is alert and oriented to person, place, and time.  Psychiatric:        Mood and Affect: Mood normal.        Behavior: Behavior normal.        Thought Content: Thought content normal.        Judgment: Judgment normal.          Results for orders placed or performed in visit on 06/24/24  POCT urine pregnancy  Result Value Ref Range   Preg Test, Ur Negative Negative      The ASCVD Risk score (Arnett DK, et al., 2019) failed to calculate for the following reasons:   The 2019 ASCVD risk score is only valid for ages 29 to 29    Assessment & Plan:  Encounter for initial prescription of injectable contraceptive -     medroxyPROGESTERone  Acetate; Inject 1 mL (150 mg total) into the muscle every 3 (three) months.  Dispense: 1 mL; Refill: 3 -     POCT urine pregnancy -     medroxyPROGESTERone  Acetate  Bloating Assessment & Plan: Referral to  gastroenterology for evaluation.  Orders: -     Ambulatory referral to Gastroenterology  Wellness examination Assessment & Plan: Appears healthy.  Orders: -     CBC with Differential/Platelet -     Comprehensive metabolic panel with GFR -     Lipid panel -     TSH + free T4  Vaginal itching -     NuSwab Vaginitis Plus (VG+)  Vaginal discharge Assessment & Plan: Had herself collect for GC, Chlamydia, Trich, BV and yeast.   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  Probably related to changing contraception.      Return in about 3 months (around 09/24/2024), or Depo-provera .    Minal Stuller K Linday Rhodes, MD

## 2024-06-24 NOTE — Assessment & Plan Note (Signed)
 Referral to gastroenterology for evaluation

## 2024-06-24 NOTE — Assessment & Plan Note (Signed)
 Had herself collect for GC, Chlamydia, Trich, BV and yeast.

## 2024-06-24 NOTE — Assessment & Plan Note (Signed)
 Reassurance that this is a self-limited disorder.  Probably related to changing contraception.

## 2024-06-24 NOTE — Assessment & Plan Note (Signed)
 Appears healthy

## 2024-06-25 LAB — CBC WITH DIFFERENTIAL/PLATELET
Basophils Absolute: 0 x10E3/uL (ref 0.0–0.2)
Basos: 0 %
EOS (ABSOLUTE): 0.1 x10E3/uL (ref 0.0–0.4)
Eos: 1 %
Hematocrit: 39.3 % (ref 34.0–46.6)
Hemoglobin: 12.7 g/dL (ref 11.1–15.9)
Immature Grans (Abs): 0 x10E3/uL (ref 0.0–0.1)
Immature Granulocytes: 0 %
Lymphocytes Absolute: 2.4 x10E3/uL (ref 0.7–3.1)
Lymphs: 25 %
MCH: 34.6 pg — ABNORMAL HIGH (ref 26.6–33.0)
MCHC: 32.3 g/dL (ref 31.5–35.7)
MCV: 107 fL — ABNORMAL HIGH (ref 79–97)
Monocytes Absolute: 0.6 x10E3/uL (ref 0.1–0.9)
Monocytes: 6 %
Neutrophils Absolute: 6.5 x10E3/uL (ref 1.4–7.0)
Neutrophils: 68 %
Platelets: 284 x10E3/uL (ref 150–450)
RBC: 3.67 x10E6/uL — ABNORMAL LOW (ref 3.77–5.28)
RDW: 11.8 % (ref 11.7–15.4)
WBC: 9.7 x10E3/uL (ref 3.4–10.8)

## 2024-06-25 LAB — COMPREHENSIVE METABOLIC PANEL WITH GFR
ALT: 8 IU/L (ref 0–32)
AST: 13 IU/L (ref 0–40)
Albumin: 4.2 g/dL (ref 3.9–4.9)
Alkaline Phosphatase: 41 IU/L — ABNORMAL LOW (ref 44–121)
BUN/Creatinine Ratio: 14 (ref 9–23)
BUN: 8 mg/dL (ref 6–20)
Bilirubin Total: 0.3 mg/dL (ref 0.0–1.2)
CO2: 24 mmol/L (ref 20–29)
Calcium: 8.8 mg/dL (ref 8.7–10.2)
Chloride: 99 mmol/L (ref 96–106)
Creatinine, Ser: 0.58 mg/dL (ref 0.57–1.00)
Globulin, Total: 2.2 g/dL (ref 1.5–4.5)
Glucose: 89 mg/dL (ref 70–99)
Potassium: 3.9 mmol/L (ref 3.5–5.2)
Sodium: 138 mmol/L (ref 134–144)
Total Protein: 6.4 g/dL (ref 6.0–8.5)
eGFR: 123 mL/min/1.73 (ref >59)

## 2024-06-25 LAB — LIPID PANEL
Chol/HDL Ratio: 2.8 ratio (ref 0.0–4.4)
Cholesterol, Total: 244 mg/dL — ABNORMAL HIGH (ref 100–199)
HDL: 86 mg/dL (ref >39)
LDL Chol Calc (NIH): 142 mg/dL — ABNORMAL HIGH (ref 0–99)
Triglycerides: 95 mg/dL (ref 0–149)
VLDL Cholesterol Cal: 16 mg/dL (ref 5–40)

## 2024-06-27 LAB — NUSWAB VAGINITIS PLUS (VG+)
Candida albicans, NAA: NEGATIVE
Candida glabrata, NAA: NEGATIVE
Chlamydia trachomatis, NAA: NEGATIVE
Neisseria gonorrhoeae, NAA: NEGATIVE
Trich vag by NAA: NEGATIVE

## 2024-06-29 ENCOUNTER — Ambulatory Visit: Payer: Self-pay | Admitting: Family Medicine

## 2024-07-09 ENCOUNTER — Encounter: Payer: Self-pay | Admitting: Family Medicine

## 2024-07-09 ENCOUNTER — Ambulatory Visit (INDEPENDENT_AMBULATORY_CARE_PROVIDER_SITE_OTHER): Payer: MEDICAID | Admitting: Family Medicine

## 2024-07-09 VITALS — BP 114/77 | HR 93 | Temp 98.7°F | Resp 18 | Ht 60.0 in | Wt 134.0 lb

## 2024-07-09 DIAGNOSIS — R140 Abdominal distension (gaseous): Secondary | ICD-10-CM | POA: Diagnosis not present

## 2024-07-09 NOTE — Progress Notes (Signed)
 Established Patient Office Visit  Subjective   Patient ID: Kristie Thornton, female    DOB: [birth year]  Age: 33 y.o. MRN: [id number]  Chief Complaint  Patient presents with   Medical Management of Chronic Issues   Bloated    HPI Discussed the use of AI scribe software for clinical note transcription with the patient, who gave verbal consent to proceed.  History of Present Illness   Kristie Thornton is a 33 year old female who presents with persistent abdominal bloating.  She has experienced significant abdominal bloating for over two weeks, which has not improved with over-the-counter medications. The bloating causes pressure on the upper abdomen, is very uncomfortable, and disrupts her sleep. It is localized to her abdomen, making her clothes feel tight.  No excessive gas, and bowel movements do not alleviate the bloating. She typically has daily bowel movements and has tried fasting to see if dietary changes would help, but this did not improve her symptoms. No pain associated with bowel movements, diarrhea, or constipation is reported.  She has a history of seeing a gynecologist a year ago, who did not find any issues, and has not seen a gastroenterologist in over ten years. She has an upcoming appointment with a gastroenterologist in February.  She has tried gas relief medications like [employer] tablets, taking up to six a day, but finds them ineffective. No symptoms of heartburn, nausea, vomiting, or eating acidic foods. She also reports feeling full quickly when eating, impacting her ability to eat normally.         ROS    Objective:     BP 114/77 (BP Location: Left Arm, Patient Position: Sitting, Cuff Size: Normal)   Pulse 93   Temp 98.7 F (37.1 C) (Oral)   Resp 18   Ht 5' (1.524 m)   Wt 134 lb (60.8 kg)   LMP 05/30/2024 (Approximate)   SpO2 97%   BMI 26.17 kg/m    Physical Exam Vitals and nursing note reviewed.  Constitutional:      Appearance: Normal appearance.   HENT:     Head: Normocephalic and atraumatic.  Eyes:     Conjunctiva/sclera: Conjunctivae normal.  Cardiovascular:     Rate and Rhythm: Normal rate and regular rhythm.  Pulmonary:     Effort: Pulmonary effort is normal.     Breath sounds: Normal breath sounds.  Musculoskeletal:     Right lower leg: No edema.     Left lower leg: No edema.  Skin:    General: Skin is warm and dry.  Neurological:     Mental Status: She is alert and oriented to person, place, and time.  Psychiatric:        Mood and Affect: Mood normal.        Behavior: Behavior normal.        Thought Content: Thought content normal.        Judgment: Judgment normal.          No results found for any visits on 07/09/24.    The ASCVD Risk score (Arnett DK, et al., 2019) failed to calculate for the following reasons:   The 2019 ASCVD risk score is only valid for ages 18 to 70    Assessment & Plan:   Assessment and Plan    Abdominal bloating Persistent abdominal bloating for over two weeks, primarily in the upper abdomen, causing significant discomfort and affecting sleep and daily activities. No associated pain, gas, diarrhea, constipation, or bowel movement  relief. Differential diagnosis includes gastrointestinal issues, but Crohn's disease is unlikely due to lack of severe symptoms such as bleeding. Previous gynecological evaluation was unremarkable. No recent gastroenterology evaluation within the last ten years. Current over-the-counter medications, including simethicone , are ineffective. - Refer to gastroenterology in [place] for earlier evaluation.          Return if symptoms worsen or fail to improve.    Kyl Givler K Huzaifa Viney, MD

## 2024-08-20 ENCOUNTER — Ambulatory Visit (INDEPENDENT_AMBULATORY_CARE_PROVIDER_SITE_OTHER): Payer: MEDICAID | Admitting: Family Medicine

## 2024-08-20 ENCOUNTER — Encounter: Payer: Self-pay | Admitting: Family Medicine

## 2024-08-20 VITALS — BP 127/72 | HR 98 | Temp 98.5°F | Resp 20 | Ht 60.0 in | Wt 134.0 lb

## 2024-08-20 DIAGNOSIS — R232 Flushing: Secondary | ICD-10-CM | POA: Insufficient documentation

## 2024-08-20 NOTE — Assessment & Plan Note (Signed)
 She has symptoms of estrogen withdrawal and is on Depo-Provera .  Will check her thyroid  function and FSH and estradiol  levels.  She does not want to take the next Depo-Provera  due in November.

## 2024-08-20 NOTE — Progress Notes (Signed)
 Established Patient Office Visit  Subjective   Patient ID: Kristie Thornton, female    DOB: 14-Mar-1991  Age: 33 y.o. MRN: [id number]  Chief Complaint  Patient presents with   Medical Management of Chronic Issues    HPI Discussed the use of AI scribe software for clinical note transcription with the patient, who gave verbal consent to proceed.  History of Present Illness   Kristie Thornton is a 33 year old female with endometriosis who presents with menopausal symptoms after switching from oral contraceptives to Depo-Provera .  She has experienced worsening symptoms since switching from an oral contraceptive pill to Depo-Provera . Symptoms include significant hair loss, night sweats, hot flashes, and brain fog.   She feels as if she is experiencing menopause, with irritability, fatigue, and insomnia. These symptoms disrupt her daily life, causing discomfort and embarrassment at work.  She started Depo-Provera  around August or September, with her next shot due in November. She has been on Depo-Provera  in the past for nearly ten years without such adverse effects.  Her past medical history includes endometriosis, managed with birth control to alleviate severe menstrual pain. She has a history of ovarian cysts and periods of amenorrhea. She is not currently sexually active and wants to have another child within the next five years, influencing her decision to discontinue birth control.  Family history reveals her sister experienced perimenopausal symptoms at a young age while on Depo-Provera  and later developed cystic acne with Implanon. Her mother and sister both experienced early menopause, with her mother entering menopause at 49 after having a child at 48.  In the review of symptoms, she reports no weight gain despite bloating, fatigue, and a lack of appetite. She experiences significant discomfort from bloating, particularly in the upper abdomen, and feels 'very hot' and sweats frequently. She also has a  history of anemia, contrasting with her current sensation of being unusually warm.        Objective:     BP 127/72 (BP Location: Left Arm, Patient Position: Sitting, Cuff Size: Normal)   Pulse 98   Temp 98.5 F (36.9 C) (Oral)   Resp 20   Ht 5' (1.524 m)   Wt 134 lb (60.8 kg)   SpO2 98%   BMI 26.17 kg/m    Physical Exam Vitals and nursing note reviewed.  Constitutional:      Appearance: Normal appearance.  HENT:     Head: Normocephalic and atraumatic.  Eyes:     Conjunctiva/sclera: Conjunctivae normal.  Cardiovascular:     Rate and Rhythm: Normal rate and regular rhythm.  Pulmonary:     Effort: Pulmonary effort is normal.     Breath sounds: Normal breath sounds.  Abdominal:     General: Abdomen is flat. Bowel sounds are normal.     Palpations: Abdomen is soft.     Tenderness: There is no abdominal tenderness.  Musculoskeletal:     Right lower leg: No edema.     Left lower leg: No edema.  Skin:    General: Skin is warm and dry.  Neurological:     Mental Status: She is alert and oriented to person, place, and time.  Psychiatric:        Mood and Affect: Mood normal.        Behavior: Behavior normal.        Thought Content: Thought content normal.        Judgment: Judgment normal.          No results found  for any visits on 08/20/24.    The ASCVD Risk score (Arnett DK, et al., 2019) failed to calculate for the following reasons:   The 2019 ASCVD risk score is only valid for ages 25 to 37    Assessment & Plan:  Hot flashes Assessment & Plan: She has symptoms of estrogen withdrawal and is on Depo-Provera .  Will check her thyroid  function and FSH and estradiol  levels.  She does not want to take the next Depo-Provera  due in November.    Orders: -     TSH + free T4 -     Estrogens, total -     FSH/LH     Return in about 3 months (around 11/20/2024).    Khalaya Mcgurn K Dameisha Tschida, MD

## 2024-08-21 LAB — FSH/LH
FSH: 5.8 m[IU]/mL
LH: 8.6 m[IU]/mL

## 2024-08-21 LAB — TSH+FREE T4
Free T4: 1.3 ng/dL (ref 0.82–1.77)
TSH: 0.954 u[IU]/mL (ref 0.450–4.500)

## 2024-08-21 LAB — ESTROGENS, TOTAL: Estrogen: 82 pg/mL

## 2024-08-23 ENCOUNTER — Ambulatory Visit: Payer: Self-pay | Admitting: Family Medicine

## 2024-09-17 ENCOUNTER — Ambulatory Visit: Payer: MEDICAID

## 2024-11-17 ENCOUNTER — Other Ambulatory Visit: Payer: MEDICAID

## 2024-11-17 ENCOUNTER — Encounter: Payer: Self-pay | Admitting: Gastroenterology

## 2024-11-17 ENCOUNTER — Ambulatory Visit: Payer: MEDICAID | Admitting: Gastroenterology

## 2024-11-17 VITALS — BP 98/62 | HR 108 | Ht 60.0 in | Wt 136.0 lb

## 2024-11-17 DIAGNOSIS — R140 Abdominal distension (gaseous): Secondary | ICD-10-CM

## 2024-11-17 DIAGNOSIS — K581 Irritable bowel syndrome with constipation: Secondary | ICD-10-CM

## 2024-11-17 DIAGNOSIS — R1032 Left lower quadrant pain: Secondary | ICD-10-CM

## 2024-11-17 LAB — CBC WITH DIFFERENTIAL/PLATELET
Basophils Absolute: 0 K/uL (ref 0.0–0.1)
Basophils Relative: 0.4 % (ref 0.0–3.0)
Eosinophils Absolute: 0.1 K/uL (ref 0.0–0.7)
Eosinophils Relative: 1 % (ref 0.0–5.0)
HCT: 39.8 % (ref 36.0–46.0)
Hemoglobin: 13.5 g/dL (ref 12.0–15.0)
Lymphocytes Relative: 34.4 % (ref 12.0–46.0)
Lymphs Abs: 1.9 K/uL (ref 0.7–4.0)
MCHC: 34 g/dL (ref 30.0–36.0)
MCV: 101.1 fl — ABNORMAL HIGH (ref 78.0–100.0)
Monocytes Absolute: 0.3 K/uL (ref 0.1–1.0)
Monocytes Relative: 5.8 % (ref 3.0–12.0)
Neutro Abs: 3.2 K/uL (ref 1.4–7.7)
Neutrophils Relative %: 58.4 % (ref 43.0–77.0)
Platelets: 299 K/uL (ref 150.0–400.0)
RBC: 3.94 Mil/uL (ref 3.87–5.11)
RDW: 12.9 % (ref 11.5–15.5)
WBC: 5.6 K/uL (ref 4.0–10.5)

## 2024-11-17 LAB — COMPREHENSIVE METABOLIC PANEL WITH GFR
ALT: 9 U/L (ref 3–35)
AST: 14 U/L (ref 5–37)
Albumin: 4.6 g/dL (ref 3.5–5.2)
Alkaline Phosphatase: 35 U/L — ABNORMAL LOW (ref 39–117)
BUN: 13 mg/dL (ref 6–23)
CO2: 29 meq/L (ref 19–32)
Calcium: 9.2 mg/dL (ref 8.4–10.5)
Chloride: 101 meq/L (ref 96–112)
Creatinine, Ser: 0.61 mg/dL (ref 0.40–1.20)
GFR: 117.54 mL/min
Glucose, Bld: 120 mg/dL — ABNORMAL HIGH (ref 70–99)
Potassium: 4.3 meq/L (ref 3.5–5.1)
Sodium: 135 meq/L (ref 135–145)
Total Bilirubin: 0.5 mg/dL (ref 0.2–1.2)
Total Protein: 7.3 g/dL (ref 6.0–8.3)

## 2024-11-17 LAB — LIPASE: Lipase: 5 U/L — ABNORMAL LOW (ref 11.0–59.0)

## 2024-11-17 LAB — SEDIMENTATION RATE: Sed Rate: 3 mm/h (ref 0–20)

## 2024-11-17 LAB — C-REACTIVE PROTEIN: CRP: <0.5 mg/dL — ABNORMAL LOW (ref 1.0–20.0)

## 2024-11-17 MED ORDER — NA SULFATE-K SULFATE-MG SULF 17.5-3.13-1.6 GM/177ML PO SOLN: 1.0000 | Freq: Once | ORAL | 0 refills | Status: AC

## 2024-11-17 NOTE — Patient Instructions (Addendum)
 Abdominal bloating and discomfort may be due to intestinal sensitivity or symptoms of irritable bowel syndrome. To relieve symptoms, avoid:  Broccoli  Baked beans  Cabbage  Carbonated drinks  Cauliflower  Chewing gum  Hard candy Abdominal distention resulting from weak abdominal muscles:  Is better in the morning  Gets worse as the day progresses  Is relieved by lying down Flatulence is gas created through bacterial action in the bowel and passed rectally. Keep in mind that:  10-18 passages per day are normal  Primary gases are harmless and odorless  Noticeable smells are trace gases related to food intake Foods to AVOID that are likely to form gas include:  Milk, dairy products, and medications that contain lactose--If your body doesn't produce the enzyme (lactase) to break it down.  Certain vegetables--baked beans, cauliflower, broccoli, cabbage  Certain starches--wheat, oats, corn, potatoes. Rice is a good substitute. Identify offending foods. Reduce or eliminate these gas-forming foods from your diet. Can look at the FODMAP diet.    __________________________________________________________________________________Low-FODMAP Eating Plan  FODMAP stands for fermentable oligosaccharides, disaccharides, monosaccharides, and polyols. These are sugars that are hard for some people to digest. A low-FODMAP eating plan may help some people who have irritable bowel syndrome (IBS) and certain other bowel (intestinal) diseases to manage their symptoms. This meal plan can be complicated to follow. Work with a diet and nutrition specialist (dietitian) to make a low-FODMAP eating plan that is right for you. A dietitian can help make sure that you get enough nutrition from this diet. What are tips for following this plan? Reading food labels Check labels for hidden FODMAPs such as: High-fructose syrup. Honey. Agave. Natural fruit flavors. Onion or garlic powder. Choose low-FODMAP foods that  contain 3-4 grams of fiber per serving. Check food labels for serving sizes. Eat only one serving at a time to make sure FODMAP levels stay low. Shopping Shop with a list of foods that are recommended on this diet and make a meal plan. Meal planning Follow a low-FODMAP eating plan for up to 6 weeks, or as told by your health care provider or dietitian. To follow the eating plan: Eliminate high-FODMAP foods from your diet completely. Choose only low-FODMAP foods to eat. You will do this for 2-6 weeks. Gradually reintroduce high-FODMAP foods into your diet one at a time. Most people should wait a few days before introducing the next new high-FODMAP food into their meal plan. Your dietitian can recommend how quickly you may reintroduce foods. Keep a daily record of what and how much you eat and drink. Make note of any symptoms that you have after eating. Review your daily record with a dietitian regularly to identify which foods you can eat and which foods you should avoid. General tips Drink enough fluid each day to keep your urine pale yellow. Avoid processed foods. These often have added sugar and may be high in FODMAPs. Avoid most dairy products, whole grains, and sweeteners. Work with a dietitian to make sure you get enough fiber in your diet. Avoid high FODMAP foods at meals to manage symptoms. Recommended foods Fruits Bananas, oranges, tangerines, lemons, limes, blueberries, raspberries, strawberries, grapes, cantaloupe, honeydew melon, kiwi, papaya, passion fruit, and pineapple. Limited amounts of dried cranberries, banana chips, and shredded coconut. Vegetables Eggplant, zucchini, cucumber, peppers, green beans, bean sprouts, lettuce, arugula, kale, Swiss chard, spinach, collard greens, bok choy, summer squash, potato, and tomato. Limited amounts of corn, carrot, and sweet potato. Green parts of scallions. Grains Gluten-free grains, such  as rice, oats, buckwheat, quinoa, corn, polenta,  and millet. Gluten-free pasta, bread, or cereal. Rice noodles. Corn tortillas. Meats and other proteins Unseasoned beef, pork, poultry, or fish. Eggs. [place]. Tofu (firm) and tempeh. Limited amounts of nuts and seeds, such as almonds, walnuts, brazil nuts, pecans, peanuts, nut butters, pumpkin seeds, chia seeds, and sunflower seeds. Dairy Lactose-free milk, yogurt, and kefir. Lactose-free cottage cheese and ice cream. Non-dairy milks, such as almond, coconut, hemp, and rice milk. Non-dairy yogurt. Limited amounts of goat cheese, brie, mozzarella, parmesan, swiss, and other hard cheeses. Fats and oils Butter-free spreads. Vegetable oils, such as olive, canola, and sunflower oil. Seasoning and other foods Artificial sweeteners with names that do not end in ol, such as aspartame, saccharine, and stevia. Maple syrup, white table sugar, raw sugar, brown sugar, and molasses. Mayonnaise, soy sauce, and tamari. Fresh basil, coriander, parsley, rosemary, and thyme. Beverages Water and mineral water. Sugar-sweetened soft drinks. Small amounts of orange juice or cranberry juice. Black and green tea. Most dry wines. Coffee. The items listed above may not be a complete list of foods and beverages you can eat. Contact a dietitian for more information. Foods to avoid Fruits Fresh, dried, and juiced forms of apple, pear, watermelon, peach, plum, cherries, apricots, blackberries, boysenberries, figs, nectarines, and mango. Avocado. Vegetables Chicory root, artichoke, asparagus, cabbage, snow peas, Brussels sprouts, broccoli, sugar snap peas, mushrooms, celery, and cauliflower. Onions, garlic, leeks, and the white part of scallions. Grains Wheat, including kamut, durum, and semolina. Barley and bulgur. Couscous. Wheat-based cereals. Wheat noodles, bread, crackers, and pastries. Meats and other proteins Fried or fatty meat. Sausage. Cashews and pistachios. Soybeans, baked beans, black beans, chickpeas, kidney  beans, fava beans, navy beans, lentils, black-eyed peas, and split peas. Dairy Milk, yogurt, ice cream, and soft cheese. Cream and sour cream. Milk-based sauces. Custard. Buttermilk. Soy milk. Seasoning and other foods Any sugar-free gum or candy. Foods that contain artificial sweeteners such as sorbitol, mannitol, isomalt, or xylitol. Foods that contain honey, high-fructose corn syrup, or agave. Bouillon, vegetable stock, beef stock, and chicken stock. Garlic and onion powder. Condiments made with onion, such as hummus, chutney, pickles, relish, salad dressing, and salsa. Tomato paste. Beverages Chicory-based drinks. Coffee substitutes. Chamomile tea. Fennel tea. Sweet or fortified wines such as port or sherry. Diet soft drinks made with isomalt, mannitol, maltitol, sorbitol, or xylitol. Apple, pear, and mango juice. Juices with high-fructose corn syrup. The items listed above may not be a complete list of foods and beverages you should avoid. Contact a dietitian for more information. Summary FODMAP stands for fermentable oligosaccharides, disaccharides, monosaccharides, and polyols. These are sugars that are hard for some people to digest. A low-FODMAP eating plan is a short-term diet that helps to ease symptoms of certain bowel diseases. The eating plan usually lasts up to 6 weeks. After that, high-FODMAP foods are reintroduced gradually and one at a time. This can help you find out which foods may be causing symptoms. A low-FODMAP eating plan can be complicated. It is best to work with a dietitian who has experience with this type of plan. This information is not intended to replace advice given to you by your health care provider. Make sure you discuss any questions you have with your health care provider. Document Revised: 09/28/2023 Document Reviewed: 09/28/2023 Elsevier Patient Education  2025 [employer].  Please go to the lab in the basement of our building to have lab work done as you  leave today. Hit B for basement  when you get on the elevator.  When the doors open the lab is on your left.  We will call you with the results. Thank you.  We have sent the following medications to your pharmacy for you to pick up at your convenience: Suprep  You have been scheduled for a colonoscopy. Please follow written instructions given to you at your visit today.   If you use inhalers (even only as needed), please bring them with you on the day of your procedure.  DO NOT TAKE 7 DAYS PRIOR TO TEST- Trulicity (dulaglutide) Ozempic, Wegovy (semaglutide) Mounjaro, Zepbound (tirzepatide) Bydureon Bcise (exanatide extended release)  DO NOT TAKE 1 DAY PRIOR TO YOUR TEST Rybelsus (semaglutide) Adlyxin (lixisenatide) Victoza (liraglutide) Byetta (exanatide) ___________________________________________________________________________  _______________________________________________________  If your blood pressure at your visit was 140/90 or greater, please contact your primary care physician to follow up on this.  _______________________________________________________  If you are age 64 or older, your body mass index should be between 23-30. Your Body mass index is 26.56 kg/m. If this is out of the aforementioned range listed, please consider follow up with your Primary Care Provider.  If you are age 79 or younger, your body mass index should be between 19-25. Your Body mass index is 26.56 kg/m. If this is out of the aformentioned range listed, please consider follow up with your Primary Care Provider.   ________________________________________________________  The [facility] providers would like to encourage you to use MYCHART to communicate with providers for non-urgent requests or questions.  Due to long hold times on the telephone, sending your provider a message by [facility] may be a faster and more efficient way to get a response.  Please allow 48 business hours for a response.   Please remember that this is for non-urgent requests.  _______________________________________________________  [facility] is using a team-based approach to care.  Your team is made up of your doctor and two to three APPS. Our APPS (Nurse Practitioners and Physician Assistants) work with your physician to ensure care continuity for you. They are fully qualified to address your health concerns and develop a treatment plan. They communicate directly with your gastroenterologist to care for you. Seeing the Advanced Practice Practitioners on your physician's team can help you by facilitating care more promptly, often allowing for earlier appointments, access to diagnostic testing, procedures, and other specialty referrals.   Due to recent changes in healthcare laws, you may see the results of your imaging and laboratory studies on MyChart before your provider has had a chance to review them.  We understand that in some cases there may be results that are confusing or concerning to you. Not all laboratory results come back in the same time frame and the provider may be waiting for multiple results in order to interpret others.  Please give us  48 hours in order for your provider to thoroughly review all the results before contacting the office for clarification of your results.

## 2024-11-17 NOTE — Progress Notes (Signed)
 "  Rayetta Veith [id number] Aug 03, 1991   Chief Complaint: Bloating  Referring Provider: Ziglar, Susan K, MD Primary GI MD: Dr. Wilhelmenia  HPI: Thu Balin is a 34 y.o. female with past medical history of anemia, anxiety/depression, eczema, endometriosis, gestational hypertension, IBS, PTSD who presents today for a complaint of bloating.    Patient seen by PCP 07/09/2024 for persistent abdominal bloating over 2 weeks, primarily in the upper abdomen causing significant discomfort and affecting sleep and daily activities.  No associated pain, gas, diarrhea, constipation, or bowel movement relief.  Previous gynecologic evaluation unremarkable.  No recent GI evaluation and was referred.  Patient last seen in office 05/02/2022 by Dr. Wilhelmenia for follow-up of change in bowel habits.  Also noted to have IBS with constipation, BRBPR.  Plan at that time was to have her try Bentyl , as needed Phenergan , and Linzess.  Also scheduled for diagnostic colonoscopy. Ultimately did not have a colonoscopy done as she was unable to find care for her children that day.  Has not followed up with us  since then.  Labs 06/24/2024: Hemoglobin 12.7, MCV 107, normal liver enzymes, elevated cholesterol, negative pregnancy test, negative vaginal swab  Labs 08/20/2024: Normal TSH, estrogen, FSH/LH   Discussed the use of AI scribe software for clinical note transcription with the patient, who gave verbal consent to proceed.  History of Present Illness Kimbly Joynt is a 34 year old female with irritable bowel syndrome who presents for evaluation of severe, persistent abdominal bloating.  Abdominal Bloating and Distention: - Severe, constant abdominal bloating present for the past year - Bloating occurs regardless of fasting or eating; even small amounts of food (e.g., a banana) result in visible distention - Unrelieved by bowel movements, passing gas, or dietary changes - Multiple interventions (simethicone , plain chicken and  rice diet for a week, laxatives, increased water intake, exercise) have not provided significant improvement - No major dietary changes at symptom onset - No consumption of carbonated beverages, alcohol, use of straws, gum, or hard candies; drinks only coffee and bottled water - Weight stable overall, with minor fluctuations  - Persistent fullness and tightness in the abdomen causing significant discomfort and interfering with activities such as sitting and bending over - Indigestion present at all times - No early satiety, acid reflux, heartburn, or dysphagia - No abdominal imaging performed for these symptoms  Abdominal Pain and Bowel Habits: - Intermittent left-sided abdominal pain, sometimes improved by bowel movements - Daily bowel movements with normal stool passage - No constipation - Corn test showed normal transit time  Irritable Bowel Syndrome History: - IBS symptoms present since childhood - Occasional pain flares, unchanged in character - Attempts to identify food triggers have not revealed clear associations  Dietary Patterns: - Intermittent fasting (one or two meals per day within a 7-8 hour window) - Frequent consumption of spicy foods, onions, garlic, and broccoli - Minimal gluten intake, prefers rice over bread or pasta  Genitourinary and Gynecologic Symptoms: - Urination is normal - Not sexually active - Recently discontinued Depo-Provera  - Multiple negative pregnancy tests, including during the period of symptoms  Constitutional and Systemic Symptoms: - No fever, chills, new rashes, chest pain, or shortness of breath  Other Relevant History: - Recently acquired a cat, but symptoms predate this - Vapes nicotine and is attempting to quit - Does not smoke cigarettes or use marijuana   Previous GI Procedures/Imaging   KUB 02/19/2022 IMPRESSION: 1. Mild fecal retention.  No bowel obstruction or ileus.  CT A/P  01/14/2014 IMPRESSION:  1. Suspected bilateral  punctate adnexal cysts, right greater than  left, with trace amount of presumably physiologic fluid within the  endometrial canal and pelvic cul-de-sac. Otherwise, no explanation  for patient's left lower quadrant abdominal pain. Further evaluation  could be performed with pelvic ultrasound as clinically indicated.  2. Moderate colonic stool burden without evidence of obstruction. No  definite evidence of diverticulitis or appendicitis.    Past Medical History:  Diagnosis Date   Abscess    sub areolar left breast   Allergy    Anemia    Anxiety    Back pain    Carpal tunnel syndrome during pregnancy 06/03/2017   Chlamydia infection affecting pregnancy, antepartum 03/26/2017   April 2018- POS [x]  TOC neg   Depression    Eczema    Endometriosis    Gestational hypertension 10/08/2017   History of IBS    Ovarian cyst    PTSD (post-traumatic stress disorder)    Suicide and self-inflicted injury (HCC)    when a teenager   Vacuum extractor delivery, delivered 10/06/2017    Past Surgical History:  Procedure Laterality Date   NO PAST SURGERIES      Current Outpatient Medications  Medication Sig Dispense Refill   albuterol  (VENTOLIN  HFA) 108 (90 Base) MCG/ACT inhaler Inhale 2 puffs into the lungs every 6 (six) hours as needed for wheezing or shortness of breath. (Patient not taking: Reported on 11/17/2024) 18 g 1   cromolyn  (OPTICROM ) 4 % ophthalmic solution Place 1 drop into both eyes 2 (two) times daily. (Patient not taking: Reported on 11/17/2024) 10 mL 3   gabapentin  (NEURONTIN ) 600 MG tablet Take by mouth. (Patient not taking: Reported on 11/17/2024)     medroxyPROGESTERone  (DEPO-PROVERA ) 150 MG/ML injection Inject 1 mL (150 mg total) into the muscle every 3 (three) months. (Patient not taking: Reported on 11/17/2024) 1 mL 3   No current facility-administered medications for this visit.    Allergies as of 11/17/2024 - Review Complete 11/17/2024  Allergen Reaction Noted    Lexapro  [escitalopram ] Other (See Comments) 01/15/2022   Sertraline hcl Other (See Comments) 08/08/2014    Family History  Problem Relation Age of Onset   Allergic rhinitis Mother    Cancer Mother        colon cancer   Colon polyps Mother    Allergic rhinitis Father    Stroke Father    Cancer Father 70       testicular cancer   Liver disease Sister    Fibromyalgia Sister    Pancreatic disease Sister    ADD / ADHD Sister    ADD / ADHD Sister    ADD / ADHD Brother    Colon cancer Neg Hx    Esophageal cancer Neg Hx    Inflammatory bowel disease Neg Hx    Pancreatic cancer Neg Hx    Rectal cancer Neg Hx    Stomach cancer Neg Hx     Social History[1]   Review of Systems:    Constitutional: No weight loss, fever, chills Cardiovascular: No chest pain Respiratory: No SOB Gastrointestinal: See HPI and otherwise negative   Physical Exam:  Vital signs: BP 98/62   Pulse (!) 108   Ht 5' (1.524 m)   Wt 136 lb (61.7 kg)   BMI 26.56 kg/m   Wt Readings from Last 3 Encounters:  11/17/24 136 lb (61.7 kg)  08/20/24 134 lb (60.8 kg)  07/09/24 134 lb (60.8 kg)  Constitutional: Pleasant, well-appearing female in NAD, alert and cooperative Head:  Normocephalic and atraumatic.  Respiratory: Respirations even and unlabored. Lungs clear to auscultation bilaterally.  No wheezes, crackles, or rhonchi.  Cardiovascular:  Regular rate and rhythm. No murmurs. No peripheral edema. Gastrointestinal:  Soft, nondistended, nontender. No rebound or guarding. Normal bowel sounds. No appreciable masses or hepatomegaly. Rectal:  Not performed.  Neurologic:  Alert and oriented x4;  grossly normal neurologically.  Skin:   Dry and intact without significant lesions or rashes. Psychiatric: Oriented to person, place and time. Demonstrates good judgement and reason without abnormal affect or behaviors.  Assessment/Plan:   Assessment & Plan Chronic abdominal bloating and distention Severe,  non-constipation-related bloating and distention of unclear etiology, refractory to dietary modification and OTC therapies, impacting quality of life. Longstanding IBS with bloating and intermittent abdominal pain, without current constipation or diarrhea, possible dietary triggers. Does have family history of IBS, no known family history of colon cancer or IBD, though her mother has had multiple colon polyps on previous colonoscopies, unclear whether these were adenomatous or advanced.  - Schedule colonoscopy. I thoroughly discussed the procedure with the patient to include nature of the procedure, alternatives, benefits, and risks (including but not limited to bleeding, infection, perforation, anesthesia/cardiac/pulmonary complications). Patient verbalized understanding and gave verbal consent to proceed with procedure.  - Labs today: CBC, CMP, TTG, IgA, ESR, CRP - Provided IBgard samples for symptomatic relief. - Advised discontinuation of simethicone  if ineffective. - Counseled on low FODMAP diet with one-month elimination trial and gradual reintroduction. - Advised avoidance of straws, chewing gum, and hard candies to reduce aerophagia. - If symptoms not explained by the workup above, consider abdominal imaging, breath testing for SIBO versus empiric treatment   Camie Furbish, PA-C [facility] 11/17/2024, 11:03 AM  Patient Care Team: Ziglar, Susan K, MD as PCP - General (Family Medicine)       [1]  Social History Tobacco Use   Smoking status: Former    Types: Cigarettes    Passive exposure: Past   Smokeless tobacco: Never   Tobacco comments:    stopped 02/2017 previously vaped  Vaping Use   Vaping status: Some Days  Substance Use Topics   Alcohol use: No    Alcohol/week: 0.0 standard drinks of alcohol    Comment: stopped 01/2017 occasional   Drug use: Not Currently    Comment: 03/12/17 last week ago, trying to stop   "

## 2024-11-18 ENCOUNTER — Ambulatory Visit: Payer: Self-pay | Admitting: Gastroenterology

## 2024-11-18 DIAGNOSIS — R140 Abdominal distension (gaseous): Secondary | ICD-10-CM

## 2024-11-18 DIAGNOSIS — R748 Abnormal levels of other serum enzymes: Secondary | ICD-10-CM

## 2024-11-18 DIAGNOSIS — D7589 Other specified diseases of blood and blood-forming organs: Secondary | ICD-10-CM

## 2024-11-18 LAB — IGA: Immunoglobulin A: 225 mg/dL (ref 47–310)

## 2024-11-18 NOTE — Progress Notes (Signed)
 Attending Physician's Attestation   I have reviewed the chart.   I agree with the Advanced Practitioner's note, impression, and recommendations with any updates as below.    Corliss Parish, MD [facility] Advanced Endoscopy Office # [phone]

## 2024-12-29 ENCOUNTER — Encounter: Payer: MEDICAID | Admitting: Gastroenterology
